# Patient Record
Sex: Female | Born: 1950
Health system: Southern US, Community
[De-identification: ages and names within clinical notes are randomized; demographics above are authoritative.]

## PROBLEM LIST (undated history)

## (undated) DIAGNOSIS — M1990 Unspecified osteoarthritis, unspecified site: Secondary | ICD-10-CM

## (undated) DIAGNOSIS — K08109 Complete loss of teeth, unspecified cause, unspecified class: Secondary | ICD-10-CM

## (undated) DIAGNOSIS — M2550 Pain in unspecified joint: Secondary | ICD-10-CM

## (undated) DIAGNOSIS — R002 Palpitations: Secondary | ICD-10-CM

## (undated) DIAGNOSIS — Z972 Presence of dental prosthetic device (complete) (partial): Secondary | ICD-10-CM

## (undated) DIAGNOSIS — E119 Type 2 diabetes mellitus without complications: Secondary | ICD-10-CM

## (undated) DIAGNOSIS — R7303 Prediabetes: Secondary | ICD-10-CM

## (undated) DIAGNOSIS — I6601 Occlusion and stenosis of right middle cerebral artery: Secondary | ICD-10-CM

## (undated) DIAGNOSIS — Z973 Presence of spectacles and contact lenses: Secondary | ICD-10-CM

## (undated) DIAGNOSIS — F32A Depression, unspecified: Secondary | ICD-10-CM

## (undated) DIAGNOSIS — I499 Cardiac arrhythmia, unspecified: Secondary | ICD-10-CM

## (undated) DIAGNOSIS — I10 Essential (primary) hypertension: Secondary | ICD-10-CM

## (undated) DIAGNOSIS — I4891 Unspecified atrial fibrillation: Secondary | ICD-10-CM

## (undated) DIAGNOSIS — F329 Major depressive disorder, single episode, unspecified: Secondary | ICD-10-CM

## (undated) DIAGNOSIS — E739 Lactose intolerance, unspecified: Secondary | ICD-10-CM

## (undated) DIAGNOSIS — F419 Anxiety disorder, unspecified: Secondary | ICD-10-CM

## (undated) HISTORY — PX: COLONOSCOPY: SHX174

## (undated) HISTORY — PX: DIAGNOSTIC LAPAROSCOPY: SUR761

## (undated) HISTORY — DX: Depression, unspecified: F32.A

## (undated) HISTORY — DX: Pain in unspecified joint: M25.50

## (undated) HISTORY — DX: Unspecified atrial fibrillation: I48.91

## (undated) HISTORY — PX: CEREBRAL ANGIOGRAM: SHX1326

## (undated) HISTORY — DX: Lactose intolerance, unspecified: E73.9

## (undated) HISTORY — DX: Unspecified osteoarthritis, unspecified site: M19.90

## (undated) HISTORY — DX: Palpitations: R00.2

## (undated) HISTORY — DX: Prediabetes: R73.03

## (undated) HISTORY — DX: Anxiety disorder, unspecified: F41.9

---
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
## 2005-01-24 ENCOUNTER — Encounter: Admission: RE | Admit: 2005-01-24 | Discharge: 2005-04-24 | Payer: Self-pay | Admitting: Internal Medicine

## 2007-06-01 ENCOUNTER — Other Ambulatory Visit: Admission: RE | Admit: 2007-06-01 | Discharge: 2007-06-01 | Payer: Self-pay | Admitting: Internal Medicine

## 2009-11-11 DIAGNOSIS — I6601 Occlusion and stenosis of right middle cerebral artery: Secondary | ICD-10-CM

## 2009-11-11 HISTORY — DX: Occlusion and stenosis of right middle cerebral artery: I66.01

## 2010-01-24 ENCOUNTER — Other Ambulatory Visit: Admission: RE | Admit: 2010-01-24 | Discharge: 2010-01-24 | Payer: Self-pay | Admitting: Family Medicine

## 2010-04-24 ENCOUNTER — Encounter: Admission: RE | Admit: 2010-04-24 | Discharge: 2010-04-24 | Payer: Self-pay | Admitting: Family Medicine

## 2010-05-11 ENCOUNTER — Ambulatory Visit (HOSPITAL_COMMUNITY): Admission: RE | Admit: 2010-05-11 | Discharge: 2010-05-11 | Payer: Self-pay | Admitting: Family Medicine

## 2010-05-25 ENCOUNTER — Encounter: Payer: Self-pay | Admitting: Family Medicine

## 2010-05-31 ENCOUNTER — Ambulatory Visit (HOSPITAL_COMMUNITY): Admission: RE | Admit: 2010-05-31 | Discharge: 2010-05-31 | Payer: Self-pay | Admitting: Interventional Radiology

## 2010-06-04 ENCOUNTER — Ambulatory Visit (HOSPITAL_COMMUNITY): Admission: RE | Admit: 2010-06-04 | Discharge: 2010-06-04 | Payer: Self-pay | Admitting: Interventional Radiology

## 2010-06-06 ENCOUNTER — Encounter: Payer: Self-pay | Admitting: Interventional Radiology

## 2010-07-11 ENCOUNTER — Ambulatory Visit (HOSPITAL_COMMUNITY): Admission: RE | Admit: 2010-07-11 | Discharge: 2010-07-11 | Payer: Self-pay | Admitting: Interventional Radiology

## 2010-10-31 ENCOUNTER — Ambulatory Visit (HOSPITAL_COMMUNITY)
Admission: RE | Admit: 2010-10-31 | Discharge: 2010-10-31 | Payer: Self-pay | Source: Home / Self Care | Attending: Interventional Radiology | Admitting: Interventional Radiology

## 2010-12-01 ENCOUNTER — Encounter: Payer: Self-pay | Admitting: *Deleted

## 2010-12-02 ENCOUNTER — Encounter: Payer: Self-pay | Admitting: Interventional Radiology

## 2010-12-02 ENCOUNTER — Encounter: Payer: Self-pay | Admitting: *Deleted

## 2010-12-02 ENCOUNTER — Encounter: Payer: Self-pay | Admitting: Family Medicine

## 2011-01-21 LAB — POCT I-STAT, CHEM 8
BUN: 19 mg/dL (ref 6–23)
Chloride: 104 mEq/L (ref 96–112)
Creatinine, Ser: 0.8 mg/dL (ref 0.4–1.2)
Glucose, Bld: 116 mg/dL — ABNORMAL HIGH (ref 70–99)
HCT: 38 % (ref 36.0–46.0)
Hemoglobin: 12.9 g/dL (ref 12.0–15.0)
Sodium: 142 mEq/L (ref 135–145)

## 2011-01-21 LAB — CBC
HCT: 35.6 % — ABNORMAL LOW (ref 36.0–46.0)
Hemoglobin: 12 g/dL (ref 12.0–15.0)
MCH: 29.6 pg (ref 26.0–34.0)
RDW: 14.1 % (ref 11.5–15.5)

## 2011-01-21 LAB — PROTIME-INR: Prothrombin Time: 13.9 seconds (ref 11.6–15.2)

## 2011-01-25 LAB — DIFFERENTIAL
Eosinophils Relative: 2 % (ref 0–5)
Lymphocytes Relative: 25 % (ref 12–46)
Neutrophils Relative %: 67 % (ref 43–77)

## 2011-01-25 LAB — SURGICAL PCR SCREEN
MRSA, PCR: NEGATIVE
Staphylococcus aureus: NEGATIVE

## 2011-01-25 LAB — PROTIME-INR
INR: 1.03 (ref 0.00–1.49)
Prothrombin Time: 13.7 seconds (ref 11.6–15.2)

## 2011-01-25 LAB — BASIC METABOLIC PANEL
CO2: 32 mEq/L (ref 19–32)
Calcium: 9.4 mg/dL (ref 8.4–10.5)
GFR calc Af Amer: >60 mL/min (ref >60)

## 2011-01-25 LAB — CBC
HCT: 36.8 % (ref 36.0–46.0)
MCHC: 34 g/dL (ref 30.0–36.0)

## 2011-01-26 LAB — PROTIME-INR
INR: 1.08 (ref 0.00–1.49)
Prothrombin Time: 13.9 seconds (ref 11.6–15.2)

## 2011-01-26 LAB — CBC
HCT: 38.7 % (ref 36.0–46.0)
Hemoglobin: 13.1 g/dL (ref 12.0–15.0)
MCH: 30.3 pg (ref 26.0–34.0)
RBC: 4.34 MIL/uL (ref 3.87–5.11)
RDW: 14.7 % (ref 11.5–15.5)
WBC: 8.4 10*3/uL (ref 4.0–10.5)

## 2011-01-26 LAB — BASIC METABOLIC PANEL
BUN: 10 mg/dL (ref 6–23)
CO2: 31 mEq/L (ref 19–32)
Creatinine, Ser: 0.79 mg/dL (ref 0.4–1.2)
GFR calc Af Amer: >60 mL/min (ref >60)
GFR calc non Af Amer: >60 mL/min (ref >60)
Potassium: 3.8 mEq/L (ref 3.5–5.1)

## 2011-01-26 LAB — APTT: aPTT: 31 seconds (ref 24–37)

## 2011-08-02 ENCOUNTER — Other Ambulatory Visit (HOSPITAL_COMMUNITY): Payer: Self-pay | Admitting: Interventional Radiology

## 2011-08-02 DIAGNOSIS — Z09 Encounter for follow-up examination after completed treatment for conditions other than malignant neoplasm: Secondary | ICD-10-CM

## 2011-08-02 DIAGNOSIS — I729 Aneurysm of unspecified site: Secondary | ICD-10-CM

## 2011-08-14 ENCOUNTER — Ambulatory Visit (HOSPITAL_COMMUNITY): Payer: Self-pay

## 2011-11-28 ENCOUNTER — Other Ambulatory Visit (HOSPITAL_COMMUNITY): Payer: Self-pay | Admitting: Interventional Radiology

## 2011-11-28 DIAGNOSIS — I729 Aneurysm of unspecified site: Secondary | ICD-10-CM

## 2012-01-14 ENCOUNTER — Other Ambulatory Visit: Payer: Self-pay | Admitting: Physician Assistant

## 2012-01-14 ENCOUNTER — Other Ambulatory Visit: Payer: Self-pay | Admitting: Radiology

## 2012-01-21 ENCOUNTER — Encounter (HOSPITAL_COMMUNITY): Payer: Self-pay | Admitting: Pharmacy Technician

## 2012-01-22 ENCOUNTER — Other Ambulatory Visit: Payer: Self-pay | Admitting: Radiology

## 2012-01-23 ENCOUNTER — Ambulatory Visit (HOSPITAL_COMMUNITY)
Admission: RE | Admit: 2012-01-23 | Discharge: 2012-01-23 | Disposition: A | Discharge status: Home / Self Care | Payer: Federal, State, Local not specified - PPO | Source: Ambulatory Visit | Attending: Interventional Radiology | Admitting: Interventional Radiology

## 2012-01-23 VITALS — BP 152/65 | HR 89 | Temp 98.1°F | Resp 18 | Ht 65.0 in | Wt 238.0 lb

## 2012-01-23 DIAGNOSIS — Z8673 Personal history of transient ischemic attack (TIA), and cerebral infarction without residual deficits: Secondary | ICD-10-CM | POA: Insufficient documentation

## 2012-01-23 DIAGNOSIS — E669 Obesity, unspecified: Secondary | ICD-10-CM | POA: Insufficient documentation

## 2012-01-23 DIAGNOSIS — I6529 Occlusion and stenosis of unspecified carotid artery: Secondary | ICD-10-CM | POA: Insufficient documentation

## 2012-01-23 DIAGNOSIS — I672 Cerebral atherosclerosis: Secondary | ICD-10-CM | POA: Insufficient documentation

## 2012-01-23 DIAGNOSIS — Z09 Encounter for follow-up examination after completed treatment for conditions other than malignant neoplasm: Secondary | ICD-10-CM | POA: Insufficient documentation

## 2012-01-23 DIAGNOSIS — I729 Aneurysm of unspecified site: Secondary | ICD-10-CM

## 2012-01-23 DIAGNOSIS — I10 Essential (primary) hypertension: Secondary | ICD-10-CM | POA: Insufficient documentation

## 2012-01-23 DIAGNOSIS — E119 Type 2 diabetes mellitus without complications: Secondary | ICD-10-CM | POA: Insufficient documentation

## 2012-01-23 LAB — CBC
HCT: 37 % (ref 36.0–46.0)
Hemoglobin: 12.5 g/dL (ref 12.0–15.0)
MCH: 29.1 pg (ref 26.0–34.0)
MCHC: 33.8 g/dL (ref 30.0–36.0)
MCV: 86 fL (ref 78.0–100.0)
Platelets: 239 10*3/uL (ref 150–400)
RBC: 4.3 MIL/uL (ref 3.87–5.11)
RDW: 14.9 % (ref 11.5–15.5)
WBC: 7.4 10*3/uL (ref 4.0–10.5)

## 2012-01-23 LAB — BASIC METABOLIC PANEL
BUN: 12 mg/dL (ref 6–23)
CO2: 29 mEq/L (ref 19–32)
Calcium: 9.5 mg/dL (ref 8.4–10.5)
Chloride: 105 mEq/L (ref 96–112)
Creatinine, Ser: 0.68 mg/dL (ref 0.50–1.10)
GFR calc Af Amer: >90 mL/min (ref >90)
GFR calc non Af Amer: >90 mL/min (ref >90)
Glucose, Bld: 122 mg/dL — ABNORMAL HIGH (ref 70–99)
Potassium: 3.4 mEq/L — ABNORMAL LOW (ref 3.5–5.1)
Sodium: 140 mEq/L (ref 135–145)

## 2012-01-23 LAB — APTT: aPTT: 33 seconds (ref 24–37)

## 2012-01-23 LAB — PROTIME-INR
INR: 1.02 (ref 0.00–1.49)
Prothrombin Time: 13.6 seconds (ref 11.6–15.2)

## 2012-01-23 MED ORDER — FENTANYL CITRATE 0.05 MG/ML IJ SOLN
INTRAMUSCULAR | Status: AC | PRN
  Administered 2012-01-23 (×2): 12.5 ug via INTRAVENOUS
  Administered 2012-01-23: 25 ug via INTRAVENOUS

## 2012-01-23 MED ORDER — SODIUM CHLORIDE 0.9 % IV SOLN
INTRAVENOUS | Status: DC
  Administered 2012-01-23: 1000 mL via INTRAVENOUS

## 2012-01-23 MED ORDER — HYDRALAZINE HCL 20 MG/ML IJ SOLN
INTRAMUSCULAR | Status: AC
  Administered 2012-01-23 (×4): 5 mg via INTRAVENOUS
  Filled 2012-01-23: qty 1

## 2012-01-23 MED ORDER — SODIUM CHLORIDE 0.9 % IV SOLN: INTRAVENOUS | Status: AC

## 2012-01-23 MED ORDER — FENTANYL CITRATE 0.05 MG/ML IJ SOLN
INTRAMUSCULAR | Status: AC
  Filled 2012-01-23: qty 4

## 2012-01-23 MED ORDER — MIDAZOLAM HCL 2 MG/2ML IJ SOLN
INTRAMUSCULAR | Status: AC
  Filled 2012-01-23: qty 6

## 2012-01-23 MED ORDER — MIDAZOLAM HCL 5 MG/5ML IJ SOLN
INTRAMUSCULAR | Status: AC | PRN
Start: 2012-01-23 — End: 2012-01-23
  Administered 2012-01-23: 1 mg via INTRAVENOUS
  Administered 2012-01-23: 0.5 mg via INTRAVENOUS

## 2012-01-23 MED ORDER — HEPARIN SODIUM (PORCINE) 1000 UNIT/ML IJ SOLN
INTRAMUSCULAR | Status: AC | PRN
  Administered 2012-01-23: 500 [IU] via INTRAVENOUS

## 2012-01-23 NOTE — ED Notes (Signed)
Report called to short stay pt stable transported via stretcher

## 2012-01-23 NOTE — ED Notes (Signed)
Rt groin site level "0" pressure dressing intact. Pedal pulse +3

## 2012-01-23 NOTE — Discharge Instructions (Signed)

## 2012-01-23 NOTE — H&P (Signed)
Cynthia Frost is an 61 y.o. female.   Chief Complaint: cerebrovascular disease HPI: Patient with prior history of right cerebral hemispheric stroke 2011 secondary to right middle cerebral artery stenosis presents today for follow up cerebral arteriogram to reassess extent of disease.  PMH: HTN,DM(01/2009), obesity,CVA 2011 ZOX:WRUE Social History:  does not have a smoking history on file. She does not have any smokeless tobacco history on file. Her alcohol and drug histories not on file.Lives in [place], 4 children, married Family History; positive for cerebral aneurysms, CVA  Allergies: No Known Allergies  Medications Prior to Admission  Medication Sig Dispense Refill  . amLODipine (NORVASC) 5 MG tablet Take 5 mg by mouth daily.      . Ascorbic Acid (VITAMIN C) 1000 MG tablet Take 1,000 mg by mouth daily.      . carvedilol (COREG) 25 MG tablet Take 25 mg by mouth 2 (two) times daily with a meal.      . cholecalciferol (VITAMIN D) 1000 UNITS tablet Take 5,000 Units by mouth daily.      . clopidogrel (PLAVIX) 75 MG tablet Take 75 mg by mouth daily.      Marland Kitchen doxazosin (CARDURA) 8 MG tablet Take 8 mg by mouth at bedtime.      Marland Kitchen losartan-hydrochlorothiazide (HYZAAR) 100-25 MG per tablet Take 1 tablet by mouth daily.      . Multiple Vitamin (MULITIVITAMIN WITH MINERALS) TABS Take 1 tablet by mouth daily.      Marland Kitchen omega-3 acid ethyl esters (LOVAZA) 1 G capsule Take 1 g by mouth daily.       Medications Prior to Admission  Medication Dose Route Frequency Provider Last Rate Last Dose  . 0.9 %  sodium chloride infusion   Intravenous Continuous Oneal Grout, MD 50 mL/hr at 01/23/12 0706 1,000 mL at 01/23/12 0706    Results for orders placed during the hospital encounter of 01/23/12 (from the past 48 hour(s))  APTT     Status: Normal   Collection Time   01/23/12  6:48 AM      Component Value Range Comment   aPTT 33  24 - 37 (seconds)   BASIC METABOLIC PANEL     Status: Abnormal   Collection Time   01/23/12  6:48 AM      Component Value Range Comment   Sodium 140  135 - 145 (mEq/L)    Potassium 3.4 (*) 3.5 - 5.1 (mEq/L)    Chloride 105  96 - 112 (mEq/L)    CO2 29  19 - 32 (mEq/L)    Glucose, Bld 122 (*) 70 - 99 (mg/dL)    BUN 12  6 - 23 (mg/dL)    Creatinine, Ser 4.54  0.50 - 1.10 (mg/dL)    Calcium 9.5  8.4 - 10.5 (mg/dL)    GFR calc non Af Amer >90  >90 (mL/min)    GFR calc Af Amer >90  >90 (mL/min)   CBC     Status: Normal   Collection Time   01/23/12  6:48 AM      Component Value Range Comment   WBC 7.4  4.0 - 10.5 (K/uL)    RBC 4.30  3.87 - 5.11 (MIL/uL)    Hemoglobin 12.5  12.0 - 15.0 (g/dL)    HCT 09.8  11.9 - 14.7 (%)    MCV 86.0  78.0 - 100.0 (fL)    MCH 29.1  26.0 - 34.0 (pg)    MCHC 33.8  30.0 - 36.0 (g/dL)  RDW 14.9  11.5 - 15.5 (%)    Platelets 239  150 - 400 (K/uL)   PROTIME-INR     Status: Normal   Collection Time   01/23/12  6:48 AM      Component Value Range Comment   Prothrombin Time 13.6  11.6 - 15.2 (seconds)    INR 1.02  0.00 - 1.49     No results found.  Review of Systems  Constitutional: Negative for fever and chills.  HENT: Positive for congestion.        Seasonal allergies, postnasal drip  Respiratory: Positive for cough. Negative for shortness of breath.   Cardiovascular: Negative for chest pain.  Gastrointestinal: Negative for nausea, vomiting and abdominal pain.  Musculoskeletal: Positive for joint pain.  Neurological: Negative for dizziness, sensory change, speech change, focal weakness and headaches.  Endo/Heme/Allergies: Positive for environmental allergies. Does not bruise/bleed easily.    Blood pressure 196/78, pulse 55, temperature 96.7 F (35.9 C), temperature source Oral, resp. rate 18, height 5\' 5"  (1.651 m), weight 238 lb (107.956 kg), SpO2 97.00%. Physical Exam  Constitutional: She is oriented to person, place, and time. She appears well-developed and well-nourished.  Cardiovascular: Regular rhythm.         bradycardic  Respiratory: Effort normal and breath sounds normal.  GI: Soft. Bowel sounds are normal. There is no tenderness.       obese  Musculoskeletal: Normal range of motion. She exhibits no edema.  Neurological: She is alert and oriented to person, place, and time. No cranial nerve deficit. Coordination normal.  Psychiatric: She has a normal mood and affect.     Assessment/Plan Patient with prior history of right cerebral hemispheric CVA 2011 secondary to right MCA stenosis; also with mild FMD right ICA; plan is for cerebral arteriogram to reassess extent of cerebrovascular disease.  Cynthia Frost,D KEVIN 01/23/2012, 8:01 AM

## 2012-01-23 NOTE — Procedures (Signed)
S/P bilat CCA arteriograms  RET CFA approach Preliminary findings  1.75 to 80 % stenosis of RT MCA M1 segment

## 2012-01-23 NOTE — ED Notes (Signed)
Rt groin level "0" pedal pulse +3

## 2012-01-24 ENCOUNTER — Telehealth (HOSPITAL_COMMUNITY): Payer: Self-pay | Admitting: Radiology

## 2012-08-05 ENCOUNTER — Other Ambulatory Visit: Payer: Self-pay | Admitting: Family Medicine

## 2012-08-05 ENCOUNTER — Ambulatory Visit
Admission: RE | Admit: 2012-08-05 | Discharge: 2012-08-05 | Disposition: A | Discharge status: Home / Self Care | Payer: Federal, State, Local not specified - PPO | Source: Ambulatory Visit | Attending: Family Medicine | Admitting: Family Medicine

## 2012-08-05 DIAGNOSIS — E049 Nontoxic goiter, unspecified: Secondary | ICD-10-CM

## 2012-08-06 ENCOUNTER — Other Ambulatory Visit: Payer: Self-pay | Admitting: Family Medicine

## 2012-08-06 DIAGNOSIS — E042 Nontoxic multinodular goiter: Secondary | ICD-10-CM

## 2012-08-07 ENCOUNTER — Telehealth (HOSPITAL_COMMUNITY): Payer: Self-pay

## 2012-08-07 NOTE — Telephone Encounter (Signed)
I spoke with Tammy in regards to the pt having a thyroid bx.  She needed to know if Dr. Corliss Skains would allow the pt to stop her Plavix for 5 days.  Per Dr. Corliss Skains, the pt needs to be told the risks of not taking her Plavix.  He stated that she could remain on her ASA but come off of her Plavix.

## 2012-08-12 ENCOUNTER — Ambulatory Visit
Admission: RE | Admit: 2012-08-12 | Discharge: 2012-08-12 | Disposition: A | Discharge status: Home / Self Care | Payer: Federal, State, Local not specified - PPO | Source: Ambulatory Visit | Attending: Family Medicine | Admitting: Family Medicine

## 2012-08-12 ENCOUNTER — Other Ambulatory Visit (HOSPITAL_COMMUNITY)
Admission: RE | Admit: 2012-08-12 | Discharge: 2012-08-12 | Disposition: A | Discharge status: Home / Self Care | Payer: Federal, State, Local not specified - PPO | Source: Ambulatory Visit | Attending: Interventional Radiology | Admitting: Interventional Radiology

## 2012-08-12 DIAGNOSIS — E049 Nontoxic goiter, unspecified: Secondary | ICD-10-CM | POA: Insufficient documentation

## 2012-08-12 DIAGNOSIS — E042 Nontoxic multinodular goiter: Secondary | ICD-10-CM

## 2012-09-01 ENCOUNTER — Ambulatory Visit (INDEPENDENT_AMBULATORY_CARE_PROVIDER_SITE_OTHER): Payer: Self-pay | Admitting: Family Medicine

## 2012-09-01 DIAGNOSIS — Z713 Dietary counseling and surveillance: Secondary | ICD-10-CM

## 2012-11-12 ENCOUNTER — Other Ambulatory Visit (HOSPITAL_COMMUNITY): Payer: Self-pay | Admitting: Interventional Radiology

## 2012-11-12 DIAGNOSIS — N320 Bladder-neck obstruction: Secondary | ICD-10-CM

## 2013-01-14 ENCOUNTER — Other Ambulatory Visit: Payer: Self-pay | Admitting: Radiology

## 2013-01-15 ENCOUNTER — Telehealth (HOSPITAL_COMMUNITY): Payer: Self-pay | Admitting: Interventional Radiology

## 2013-01-18 ENCOUNTER — Telehealth (HOSPITAL_COMMUNITY): Payer: Self-pay | Admitting: Interventional Radiology

## 2013-01-19 ENCOUNTER — Ambulatory Visit (HOSPITAL_COMMUNITY)
Admission: RE | Admit: 2013-01-19 | Discharge: 2013-01-19 | Disposition: A | Discharge status: Home / Self Care | Payer: Federal, State, Local not specified - PPO | Source: Ambulatory Visit | Attending: Interventional Radiology | Admitting: Interventional Radiology

## 2013-01-22 ENCOUNTER — Other Ambulatory Visit: Payer: Self-pay | Admitting: Radiology

## 2013-01-29 ENCOUNTER — Ambulatory Visit (HOSPITAL_COMMUNITY)
Admission: RE | Admit: 2013-01-29 | Discharge: 2013-01-29 | Disposition: A | Discharge status: Home / Self Care | Payer: Federal, State, Local not specified - PPO | Source: Ambulatory Visit | Attending: Interventional Radiology | Admitting: Interventional Radiology

## 2013-01-29 ENCOUNTER — Encounter (HOSPITAL_COMMUNITY): Payer: Self-pay

## 2013-01-29 DIAGNOSIS — N320 Bladder-neck obstruction: Secondary | ICD-10-CM

## 2013-01-29 DIAGNOSIS — I672 Cerebral atherosclerosis: Secondary | ICD-10-CM | POA: Insufficient documentation

## 2013-01-29 LAB — CBC WITH DIFFERENTIAL/PLATELET
Eosinophils Absolute: 0.2 10*3/uL (ref 0.0–0.7)
Eosinophils Relative: 2 % (ref 0–5)
Hemoglobin: 13.9 g/dL (ref 12.0–15.0)
Lymphs Abs: 2.3 10*3/uL (ref 0.7–4.0)
MCH: 30 pg (ref 26.0–34.0)
MCV: 85.7 fL (ref 78.0–100.0)
Monocytes Relative: 5 % (ref 3–12)
Platelets: 256 10*3/uL (ref 150–400)
RBC: 4.63 MIL/uL (ref 3.87–5.11)

## 2013-01-29 LAB — BASIC METABOLIC PANEL
BUN: 12 mg/dL (ref 6–23)
CO2: 27 mEq/L (ref 19–32)
Calcium: 9.4 mg/dL (ref 8.4–10.5)
Glucose, Bld: 117 mg/dL — ABNORMAL HIGH (ref 70–99)
Sodium: 140 mEq/L (ref 135–145)

## 2013-01-29 LAB — PROTIME-INR: INR: 0.99 (ref 0.00–1.49)

## 2013-01-29 MED ORDER — MIDAZOLAM HCL 2 MG/2ML IJ SOLN
INTRAMUSCULAR | Status: AC | PRN
  Administered 2013-01-29: 1 mg via INTRAVENOUS
  Administered 2013-01-29: 0.5 mg via INTRAVENOUS

## 2013-01-29 MED ORDER — HYDRALAZINE HCL 20 MG/ML IJ SOLN
INTRAMUSCULAR | Status: AC | PRN
  Administered 2013-01-29 (×3): 5 mg via INTRAVENOUS

## 2013-01-29 MED ORDER — HYDRALAZINE HCL 20 MG/ML IJ SOLN
INTRAMUSCULAR | Status: AC
  Filled 2013-01-29: qty 1

## 2013-01-29 MED ORDER — IOHEXOL 300 MG/ML  SOLN
150.0000 mL | Freq: Once | INTRAMUSCULAR | Status: AC | PRN
  Administered 2013-01-29: 50 mL via INTRA_ARTERIAL

## 2013-01-29 MED ORDER — FENTANYL CITRATE 0.05 MG/ML IJ SOLN
INTRAMUSCULAR | Status: AC
  Filled 2013-01-29: qty 4

## 2013-01-29 MED ORDER — SODIUM CHLORIDE 0.9 % IV SOLN
Freq: Once | INTRAVENOUS | Status: AC
  Administered 2013-01-29: 07:00:00 via INTRAVENOUS

## 2013-01-29 MED ORDER — SODIUM CHLORIDE 0.9 % IV SOLN: INTRAVENOUS | Status: AC

## 2013-01-29 MED ORDER — FENTANYL CITRATE 0.05 MG/ML IJ SOLN
INTRAMUSCULAR | Status: AC | PRN
  Administered 2013-01-29: 12.5 ug via INTRAVENOUS
  Administered 2013-01-29: 25 ug via INTRAVENOUS

## 2013-01-29 MED ORDER — MIDAZOLAM HCL 2 MG/2ML IJ SOLN
INTRAMUSCULAR | Status: AC
  Filled 2013-01-29: qty 4

## 2013-01-29 NOTE — Procedures (Signed)
S/P bilateral common carotid arteriogram . RT CFa approach  Findings . 1.Stable 85 % stenosis RT MCA prox . 2. Patent Lt ophthalmic artery

## 2013-01-29 NOTE — H&P (Signed)
Chief Complaint: "I'm here for an arteriogram" HPI: Cynthia Frost is an 62 y.o. female with prior hx of Rt MCA M1 stenosis. She is here for yearly follow up arteriogram. She reports that her eye doctor was worried she may have a blood clot behind her left eye as she has been having some visual changes and headaches behind her eye. She is still taking her Plavix. PMHx and meds reviewed. She feels well with the exception of some mild post nasal drip from allergies, but no fevers, chills, cough, SOB, CP. No dysuria or diarrhea.  Past Medical History: HTN, Hyperlipidemia  Past Surgical History: History reviewed. No pertinent past surgical history.  Family History: No family history on file.  Social History:  has no tobacco, alcohol, and drug history on file.  Allergies: No Known Allergies  Medications: Norvasc, Coreg, Plavix, Cardura, Hyzaar, MVI  Please HPI for pertinent positives, otherwise complete 10 system ROS negative.  Physical Exam: Blood pressure 164/80, pulse 66, temperature 97.3 F (36.3 C), temperature source Oral, resp. rate 18, height 5\' 5"  (1.651 m), weight 239 lb (108.41 kg), SpO2 98.00%. Body mass index is 39.77 kg/(m^2).   General Appearance:  Alert, cooperative, no distress, appears stated age  Head:  Normocephalic, without obvious abnormality, atraumatic  EENT: Unremarkable, PERRLA, EOMI  Neck: Supple, symmetrical, trachea midline, no adenopathy, thyroid: not enlarged, symmetric, no tenderness/mass/nodules  Lungs:   Clear to auscultation bilaterally, no w/r/r, respirations unlabored without use of accessory muscles.  Heart:  Regular rate and rhythm, S1, S2 normal, no murmur, rub or gallop. Carotids 2+ without bruit.  Extremities: Extremities normal, atraumatic, no cyanosis or edema  Pulses: 2+ and symmetric  Neurologic: Normal affect, no gross deficits.   Results for orders placed during the hospital encounter of 01/29/13 (from the past 48 hour(s))  APTT      Status: None   Collection Time    01/29/13  7:12 AM      Result Value Range   aPTT 33  24 - 37 seconds  BASIC METABOLIC PANEL     Status: Abnormal   Collection Time    01/29/13  7:12 AM      Result Value Range   Sodium 140  135 - 145 mEq/L   Potassium 3.8  3.5 - 5.1 mEq/L   Chloride 102  96 - 112 mEq/L   CO2 27  19 - 32 mEq/L   Glucose, Bld 117 (*) 70 - 99 mg/dL   BUN 12  6 - 23 mg/dL   Creatinine, Ser 4.09  0.50 - 1.10 mg/dL   Calcium 9.4  8.4 - 81.1 mg/dL   GFR calc non Af Amer >90  >90 mL/min   GFR calc Af Amer >90  >90 mL/min   Comment:            The eGFR has been calculated     using the CKD EPI equation.     This calculation has not been     validated in all clinical     situations.     eGFR's persistently     <90 mL/min signify     possible Chronic Kidney Disease.  CBC WITH DIFFERENTIAL     Status: None   Collection Time    01/29/13  7:12 AM      Result Value Range   WBC 9.1  4.0 - 10.5 K/uL   RBC 4.63  3.87 - 5.11 MIL/uL   Hemoglobin 13.9  12.0 - 15.0 g/dL  HCT 39.7  36.0 - 46.0 %   MCV 85.7  78.0 - 100.0 fL   MCH 30.0  26.0 - 34.0 pg   MCHC 35.0  30.0 - 36.0 g/dL   RDW 78.2  95.6 - 21.3 %   Platelets 256  150 - 400 K/uL   Neutrophils Relative 68  43 - 77 %   Neutro Abs 6.1  1.7 - 7.7 K/uL   Lymphocytes Relative 25  12 - 46 %   Lymphs Abs 2.3  0.7 - 4.0 K/uL   Monocytes Relative 5  3 - 12 %   Monocytes Absolute 0.5  0.1 - 1.0 K/uL   Eosinophils Relative 2  0 - 5 %   Eosinophils Absolute 0.2  0.0 - 0.7 K/uL   Basophils Relative 1  0 - 1 %   Basophils Absolute 0.1  0.0 - 0.1 K/uL  PROTIME-INR     Status: None   Collection Time    01/29/13  7:12 AM      Result Value Range   Prothrombin Time 13.0  11.6 - 15.2 seconds   INR 0.99  0.00 - 1.49   No results found.  Assessment/Plan Hx of right MCA M1 segment stenosis New (L)eye symptoms For cerebral arteriogram. Reviewed procedure, risks, complications. Labs reviewed. Consent signed in  chart  Brayton El PA-C 01/29/2013, 8:26 AM

## 2013-01-29 NOTE — ED Notes (Signed)
Short stay called for bed request to continue recovery until discharge at 1245.  Spoke with Renee', no beds available at this time.  She will call when bed available

## 2013-01-29 NOTE — ED Notes (Signed)
Right groin closure Exoseal 5 FR covered with gauze and tegaderm

## 2013-05-12 ENCOUNTER — Other Ambulatory Visit (HOSPITAL_COMMUNITY)
Admission: RE | Admit: 2013-05-12 | Discharge: 2013-05-12 | Disposition: A | Discharge status: Home / Self Care | Payer: Federal, State, Local not specified - PPO | Source: Ambulatory Visit | Attending: Family Medicine | Admitting: Family Medicine

## 2013-05-12 ENCOUNTER — Other Ambulatory Visit: Payer: Self-pay | Admitting: Family Medicine

## 2013-05-12 DIAGNOSIS — Z01419 Encounter for gynecological examination (general) (routine) without abnormal findings: Secondary | ICD-10-CM | POA: Insufficient documentation

## 2014-02-25 ENCOUNTER — Other Ambulatory Visit (HOSPITAL_COMMUNITY): Payer: Self-pay | Admitting: Interventional Radiology

## 2014-02-25 DIAGNOSIS — I771 Stricture of artery: Secondary | ICD-10-CM

## 2014-03-17 DIAGNOSIS — R0982 Postnasal drip: Secondary | ICD-10-CM | POA: Diagnosis not present

## 2014-03-17 DIAGNOSIS — R059 Cough, unspecified: Secondary | ICD-10-CM | POA: Diagnosis not present

## 2014-03-17 DIAGNOSIS — R05 Cough: Secondary | ICD-10-CM | POA: Diagnosis not present

## 2014-03-24 DIAGNOSIS — L723 Sebaceous cyst: Secondary | ICD-10-CM | POA: Diagnosis not present

## 2014-03-28 DIAGNOSIS — I10 Essential (primary) hypertension: Secondary | ICD-10-CM | POA: Diagnosis not present

## 2014-03-28 DIAGNOSIS — I679 Cerebrovascular disease, unspecified: Secondary | ICD-10-CM | POA: Diagnosis not present

## 2014-03-28 DIAGNOSIS — E041 Nontoxic single thyroid nodule: Secondary | ICD-10-CM | POA: Diagnosis not present

## 2014-03-28 DIAGNOSIS — R809 Proteinuria, unspecified: Secondary | ICD-10-CM | POA: Diagnosis not present

## 2014-03-28 DIAGNOSIS — E1129 Type 2 diabetes mellitus with other diabetic kidney complication: Secondary | ICD-10-CM | POA: Diagnosis not present

## 2014-03-31 DIAGNOSIS — D492 Neoplasm of unspecified behavior of bone, soft tissue, and skin: Secondary | ICD-10-CM | POA: Diagnosis not present

## 2014-03-31 DIAGNOSIS — M19049 Primary osteoarthritis, unspecified hand: Secondary | ICD-10-CM | POA: Diagnosis not present

## 2014-03-31 DIAGNOSIS — G5600 Carpal tunnel syndrome, unspecified upper limb: Secondary | ICD-10-CM | POA: Diagnosis not present

## 2014-04-08 ENCOUNTER — Other Ambulatory Visit: Payer: Self-pay | Admitting: Radiology

## 2014-04-12 ENCOUNTER — Encounter (HOSPITAL_COMMUNITY): Payer: Self-pay

## 2014-04-14 ENCOUNTER — Ambulatory Visit (HOSPITAL_COMMUNITY)
Admission: RE | Admit: 2014-04-14 | Discharge: 2014-04-14 | Disposition: A | Discharge status: Home / Self Care | Payer: BC Managed Care – PPO | Source: Ambulatory Visit | Attending: Interventional Radiology | Admitting: Interventional Radiology

## 2014-04-14 ENCOUNTER — Encounter (HOSPITAL_COMMUNITY): Payer: Self-pay

## 2014-04-14 DIAGNOSIS — E119 Type 2 diabetes mellitus without complications: Secondary | ICD-10-CM | POA: Insufficient documentation

## 2014-04-14 DIAGNOSIS — I669 Occlusion and stenosis of unspecified cerebral artery: Secondary | ICD-10-CM | POA: Diagnosis not present

## 2014-04-14 DIAGNOSIS — I10 Essential (primary) hypertension: Secondary | ICD-10-CM | POA: Insufficient documentation

## 2014-04-14 DIAGNOSIS — I6529 Occlusion and stenosis of unspecified carotid artery: Secondary | ICD-10-CM | POA: Diagnosis not present

## 2014-04-14 DIAGNOSIS — I672 Cerebral atherosclerosis: Secondary | ICD-10-CM | POA: Insufficient documentation

## 2014-04-14 DIAGNOSIS — I720 Aneurysm of carotid artery: Secondary | ICD-10-CM | POA: Diagnosis not present

## 2014-04-14 DIAGNOSIS — I6601 Occlusion and stenosis of right middle cerebral artery: Secondary | ICD-10-CM | POA: Insufficient documentation

## 2014-04-14 DIAGNOSIS — I771 Stricture of artery: Secondary | ICD-10-CM

## 2014-04-14 HISTORY — DX: Occlusion and stenosis of right middle cerebral artery: I66.01

## 2014-04-14 HISTORY — DX: Type 2 diabetes mellitus without complications: E11.9

## 2014-04-14 HISTORY — DX: Essential (primary) hypertension: I10

## 2014-04-14 LAB — CBC WITH DIFFERENTIAL/PLATELET
Basophils Absolute: 0 10*3/uL (ref 0.0–0.1)
Basophils Relative: 0 % (ref 0–1)
Eosinophils Absolute: 0.3 10*3/uL (ref 0.0–0.7)
Eosinophils Relative: 3 % (ref 0–5)
HCT: 36.9 % (ref 36.0–46.0)
HEMOGLOBIN: 12.7 g/dL (ref 12.0–15.0)
LYMPHS ABS: 2 10*3/uL (ref 0.7–4.0)
LYMPHS PCT: 25 % (ref 12–46)
MCH: 30 pg (ref 26.0–34.0)
MCHC: 34.4 g/dL (ref 30.0–36.0)
MCV: 87.2 fL (ref 78.0–100.0)
MONOS PCT: 5 % (ref 3–12)
Monocytes Absolute: 0.4 10*3/uL (ref 0.1–1.0)
NEUTROS ABS: 5.3 10*3/uL (ref 1.7–7.7)
NEUTROS PCT: 67 % (ref 43–77)
PLATELETS: 232 10*3/uL (ref 150–400)
RBC: 4.23 MIL/uL (ref 3.87–5.11)
RDW: 15.3 % (ref 11.5–15.5)
WBC: 8 10*3/uL (ref 4.0–10.5)

## 2014-04-14 LAB — BASIC METABOLIC PANEL
BUN: 11 mg/dL (ref 6–23)
CALCIUM: 9.2 mg/dL (ref 8.4–10.5)
CO2: 26 mEq/L (ref 19–32)
Chloride: 104 mEq/L (ref 96–112)
Creatinine, Ser: 0.61 mg/dL (ref 0.50–1.10)
GFR calc Af Amer: >90 mL/min (ref >90)
GLUCOSE: 124 mg/dL — AB (ref 70–99)
POTASSIUM: 3.8 meq/L (ref 3.7–5.3)
SODIUM: 143 meq/L (ref 137–147)

## 2014-04-14 LAB — APTT: APTT: 32 s (ref 24–37)

## 2014-04-14 LAB — PROTIME-INR
INR: 1.03 (ref 0.00–1.49)
Prothrombin Time: 13.3 seconds (ref 11.6–15.2)

## 2014-04-14 MED ORDER — SODIUM CHLORIDE 0.9 % IV SOLN: INTRAVENOUS | Status: AC

## 2014-04-14 MED ORDER — SODIUM CHLORIDE 0.9 % IV SOLN
INTRAVENOUS | Status: DC
  Administered 2014-04-14: 07:00:00 via INTRAVENOUS

## 2014-04-14 MED ORDER — MIDAZOLAM HCL 2 MG/2ML IJ SOLN
INTRAMUSCULAR | Status: AC | PRN
  Administered 2014-04-14: 1 mg via INTRAVENOUS
  Administered 2014-04-14: 0.5 mg via INTRAVENOUS

## 2014-04-14 MED ORDER — IOHEXOL 300 MG/ML  SOLN
150.0000 mL | Freq: Once | INTRAMUSCULAR | Status: AC | PRN
  Administered 2014-04-14: 50 mL via INTRA_ARTERIAL

## 2014-04-14 MED ORDER — HYDRALAZINE HCL 20 MG/ML IJ SOLN
INTRAMUSCULAR | Status: AC
  Filled 2014-04-14: qty 1

## 2014-04-14 MED ORDER — HYDRALAZINE HCL 20 MG/ML IJ SOLN
INTRAMUSCULAR | Status: AC | PRN
  Administered 2014-04-14: 5 mg via INTRAVENOUS

## 2014-04-14 MED ORDER — FENTANYL CITRATE 0.05 MG/ML IJ SOLN
INTRAMUSCULAR | Status: AC | PRN
  Administered 2014-04-14: 12.5 ug via INTRAVENOUS
  Administered 2014-04-14: 25 ug via INTRAVENOUS

## 2014-04-14 MED ORDER — FENTANYL CITRATE 0.05 MG/ML IJ SOLN
INTRAMUSCULAR | Status: AC
  Filled 2014-04-14: qty 2

## 2014-04-14 MED ORDER — HEPARIN SOD (PORK) LOCK FLUSH 100 UNIT/ML IV SOLN
INTRAVENOUS | Status: AC | PRN
  Administered 2014-04-14: 1000 [IU] via INTRAVENOUS

## 2014-04-14 MED ORDER — MIDAZOLAM HCL 2 MG/2ML IJ SOLN
INTRAMUSCULAR | Status: AC
  Filled 2014-04-14: qty 4

## 2014-04-14 NOTE — Sedation Documentation (Signed)
Patient denies pain and is resting comfortably.  

## 2014-04-14 NOTE — H&P (Signed)
Cynthia Frost is an 63 y.o. female.   Chief Complaint: Pt scheduled today for follow up cerebral arteriogram regarding known R Middle Cerebral Artery stenosis First seen by [facility]iology 05/2010 after TIA like episode with Left sided weakness and tingling; + visual change that resolved within minutes Cerebral arteriogram 05/2010 revealed R MCA stenosis.  Pt was asymptomatic and placed on ASA/Plavix.  Follow up 3 mo later showed actual improvement in stenosis. 10/2010 arteriogram was stable; 3/13 and 3/14 arteriograms all stable Pt still asymptomatic on ASA/Plavix. Now scheduled for cerebral arteriogram with Dr Estanislado Pandy  HPI: HTN; R MCA stenosis; DM   Past Medical History  Diagnosis Date  . Hypertension   . Stenosis of right middle cerebral artery 2011  . Diabetes mellitus without complication     History reviewed. No pertinent past surgical history.  History reviewed. No pertinent family history. Social History:  reports that she has never smoked. She does not have any smokeless tobacco history on file. Her alcohol and drug histories are not on file.  Allergies: No Known Allergies   (Not in a hospital admission)  Results for orders placed during the hospital encounter of 04/14/14 (from the past 48 hour(s))  APTT     Status: None   Collection Time    04/14/14  6:47 AM      Result Value Ref Range   aPTT 32  24 - 37 seconds  BASIC METABOLIC PANEL     Status: Abnormal   Collection Time    04/14/14  6:47 AM      Result Value Ref Range   Sodium 143  137 - 147 mEq/L   Potassium 3.8  3.7 - 5.3 mEq/L   Chloride 104  96 - 112 mEq/L   CO2 26  19 - 32 mEq/L   Glucose, Bld 124 (*) 70 - 99 mg/dL   BUN 11  6 - 23 mg/dL   Creatinine, Ser 0.61  0.50 - 1.10 mg/dL   Calcium 9.2  8.4 - 10.5 mg/dL   GFR calc non Af Amer >90  >90 mL/min   GFR calc Af Amer >90  >90 mL/min   Comment: (NOTE)     The eGFR has been calculated using the CKD EPI equation.     This calculation  has not been validated in all clinical situations.     eGFR's persistently <90 mL/min signify possible Chronic Kidney     Disease.  CBC WITH DIFFERENTIAL     Status: None   Collection Time    04/14/14  6:47 AM      Result Value Ref Range   WBC 8.0  4.0 - 10.5 K/uL   RBC 4.23  3.87 - 5.11 MIL/uL   Hemoglobin 12.7  12.0 - 15.0 g/dL   HCT 36.9  36.0 - 46.0 %   MCV 87.2  78.0 - 100.0 fL   MCH 30.0  26.0 - 34.0 pg   MCHC 34.4  30.0 - 36.0 g/dL   RDW 15.3  11.5 - 15.5 %   Platelets 232  150 - 400 K/uL   Neutrophils Relative % 67  43 - 77 %   Neutro Abs 5.3  1.7 - 7.7 K/uL   Lymphocytes Relative 25  12 - 46 %   Lymphs Abs 2.0  0.7 - 4.0 K/uL   Monocytes Relative 5  3 - 12 %   Monocytes Absolute 0.4  0.1 - 1.0 K/uL   Eosinophils Relative 3  0 - 5 %  Eosinophils Absolute 0.3  0.0 - 0.7 K/uL   Basophils Relative 0  0 - 1 %   Basophils Absolute 0.0  0.0 - 0.1 K/uL  PROTIME-INR     Status: None   Collection Time    04/14/14  6:47 AM      Result Value Ref Range   Prothrombin Time 13.3  11.6 - 15.2 seconds   INR 1.03  0.00 - 1.49   No results found.  Review of Systems  Constitutional: Negative for fever and weight loss.  Eyes: Negative for blurred vision and double vision.  Respiratory: Positive for cough. Negative for shortness of breath.   Cardiovascular: Negative for chest pain.  Gastrointestinal: Negative for nausea, vomiting and abdominal pain.  Neurological: Negative for dizziness, tingling, tremors, sensory change, speech change, weakness and headaches.  Psychiatric/Behavioral: Negative for substance abuse.    Blood pressure 166/66, pulse 57, temperature 98.4 F (36.9 C), temperature source Oral, resp. rate 15, height 5' 5"  (1.651 m), weight 117.935 kg (260 lb), SpO2 98.00%. Physical Exam  Constitutional: She is oriented to person, place, and time. She appears well-developed and well-nourished.  HENT:  dentures  Cardiovascular: Normal rate and regular rhythm.   No murmur  heard. Respiratory: Effort normal and breath sounds normal. She has no wheezes.  GI: Soft. Bowel sounds are normal. There is no tenderness.  Musculoskeletal: Normal range of motion.  Neurological: She is alert and oriented to person, place, and time.  Skin: Skin is warm and dry.  Psychiatric: She has a normal mood and affect. Her behavior is normal. Judgment and thought content normal.     Assessment/Plan Known R MCA stenosis Followed by Dr Estanislado Pandy Pt asymptomatic on ASA/Plavix Scheduled now for recheck Pt aware of procedure benefits and risks and agreeable to proceed Consent signed and in chart   Lavonia Drafts 04/14/2014, 8:55 AM

## 2014-04-14 NOTE — Procedures (Signed)
S/P 4 vessel cerebral arteriogram. RT CFa approach. Findings . Approx 85 to 90% stenosis of RT MCA M1 seg

## 2014-04-14 NOTE — Discharge Instructions (Signed)
Angiography, Care After ° °Refer to this sheet in the next few weeks. These instructions provide you with information on caring for yourself after your procedure. Your health care provider may also give you more specific instructions. Your treatment has been planned according to current medical practices, but problems sometimes occur. Call your health care provider if you have any problems or questions after your procedure.  °WHAT TO EXPECT AFTER THE PROCEDURE °After your procedure, it is typical to have the following sensations: °· Minor discomfort or tenderness and a small bump at the catheter insertion site. The bump should usually decrease in size and tenderness within 1 to 2 weeks. °· Any bruising will usually fade within 2 to 4 weeks. °HOME CARE INSTRUCTIONS  °· You may need to keep taking blood thinners if they were prescribed for you. Only take over-the-counter or prescription medicines for pain, fever, or discomfort as directed by your health care provider. °· Do not apply powder or lotion to the site. °· Do not sit in a bathtub, swimming pool, or whirlpool for 5 to 7 days. °· You may shower 24 hours after the procedure. Remove the bandage (dressing) and gently wash the site with plain soap and water. Gently pat the site dry. °· Inspect the site at least twice daily. °· Limit your activity for the first 24 hours. Do not bend, squat, or lift anything over 10 lb (9 kg) or as directed by your health care provider. °· Do not drive home if you are discharged the day of the procedure. Have someone else drive you. Follow instructions about when you can drive or return to work. °SEEK MEDICAL CARE IF: °· You get lightheaded when standing up. °· You have drainage (other than a small amount of blood on the dressing). °· You have chills. °· You have a fever. °· You have redness, warmth, swelling, or pain at the insertion site. °SEEK IMMEDIATE MEDICAL CARE IF:  °· You develop chest pain or shortness of breath, feel  faint, or pass out. °· You have bleeding, swelling larger than a walnut, or drainage from the catheter insertion site. °· You develop pain, discoloration, coldness, or severe bruising in the leg or arm that held the catheter. °· You have heavy bleeding from the site. If this happens, hold pressure on the site. °MAKE SURE YOU: °· Understand these instructions. °· Will watch your condition. °· Will get help right away if you are not doing well or get worse. °Document Released: 05/16/2005 Document Revised: 06/30/2013 Document Reviewed: 03/22/2013 °[employer]® Patient Information ©2014 [employer]. ° °

## 2014-04-18 DIAGNOSIS — G5600 Carpal tunnel syndrome, unspecified upper limb: Secondary | ICD-10-CM | POA: Diagnosis not present

## 2014-04-18 DIAGNOSIS — D492 Neoplasm of unspecified behavior of bone, soft tissue, and skin: Secondary | ICD-10-CM | POA: Diagnosis not present

## 2014-04-18 DIAGNOSIS — M19049 Primary osteoarthritis, unspecified hand: Secondary | ICD-10-CM | POA: Diagnosis not present

## 2014-04-22 ENCOUNTER — Other Ambulatory Visit: Payer: Self-pay | Admitting: Orthopedic Surgery

## 2014-04-25 ENCOUNTER — Encounter (HOSPITAL_BASED_OUTPATIENT_CLINIC_OR_DEPARTMENT_OTHER): Payer: Self-pay | Admitting: *Deleted

## 2014-04-25 NOTE — Progress Notes (Signed)
04/25/14 1125  OBSTRUCTIVE SLEEP APNEA  Have you ever been diagnosed with sleep apnea through a sleep study? No  Do you snore loudly (loud enough to be heard through closed doors)?  0  Do you often feel tired, fatigued, or sleepy during the daytime? 0  Has anyone observed you stop breathing during your sleep? 0  Do you have, or are you being treated for high blood pressure? 1  BMI more than 35 kg/m2? 1  Age over 65 years old? 1  Neck circumference greater than 40 cm/16 inches? 1  Gender: 0  Obstructive Sleep Apnea Score 4  Score 4 or greater  Results sent to PCP

## 2014-04-25 NOTE — Progress Notes (Signed)
To come in for ekg-labs done 04/15/14 Denies any cardiac or resp problems

## 2014-04-26 ENCOUNTER — Other Ambulatory Visit: Payer: Self-pay

## 2014-04-26 ENCOUNTER — Encounter (HOSPITAL_BASED_OUTPATIENT_CLINIC_OR_DEPARTMENT_OTHER)
Admission: RE | Admit: 2014-04-26 | Discharge: 2014-04-26 | Disposition: A | Discharge status: Home / Self Care | Payer: BC Managed Care – PPO | Source: Ambulatory Visit | Attending: Orthopedic Surgery | Admitting: Orthopedic Surgery

## 2014-04-26 DIAGNOSIS — M129 Arthropathy, unspecified: Secondary | ICD-10-CM | POA: Diagnosis not present

## 2014-04-26 DIAGNOSIS — M19049 Primary osteoarthritis, unspecified hand: Secondary | ICD-10-CM | POA: Diagnosis not present

## 2014-04-26 DIAGNOSIS — I10 Essential (primary) hypertension: Secondary | ICD-10-CM | POA: Diagnosis not present

## 2014-04-26 DIAGNOSIS — G5600 Carpal tunnel syndrome, unspecified upper limb: Secondary | ICD-10-CM | POA: Diagnosis not present

## 2014-04-26 DIAGNOSIS — M6740 Ganglion, unspecified site: Secondary | ICD-10-CM | POA: Diagnosis not present

## 2014-04-26 DIAGNOSIS — Z7902 Long term (current) use of antithrombotics/antiplatelets: Secondary | ICD-10-CM | POA: Diagnosis not present

## 2014-04-26 DIAGNOSIS — G479 Sleep disorder, unspecified: Secondary | ICD-10-CM | POA: Diagnosis not present

## 2014-04-26 DIAGNOSIS — Z7982 Long term (current) use of aspirin: Secondary | ICD-10-CM | POA: Diagnosis not present

## 2014-04-26 DIAGNOSIS — Z01810 Encounter for preprocedural cardiovascular examination: Secondary | ICD-10-CM | POA: Diagnosis not present

## 2014-04-26 DIAGNOSIS — E119 Type 2 diabetes mellitus without complications: Secondary | ICD-10-CM | POA: Diagnosis not present

## 2014-04-26 NOTE — Pre-Procedure Instructions (Signed)
EKG reviewed by Dr. Al Corpus, [place] for surgery.

## 2014-04-27 ENCOUNTER — Encounter (HOSPITAL_BASED_OUTPATIENT_CLINIC_OR_DEPARTMENT_OTHER)
Admission: RE | Disposition: A | Discharge status: Home / Self Care | Payer: Self-pay | Source: Ambulatory Visit | Attending: Orthopedic Surgery

## 2014-04-27 ENCOUNTER — Ambulatory Visit (HOSPITAL_BASED_OUTPATIENT_CLINIC_OR_DEPARTMENT_OTHER)
Admission: RE | Admit: 2014-04-27 | Discharge: 2014-04-27 | Disposition: A | Discharge status: Home / Self Care | Payer: BC Managed Care – PPO | Source: Ambulatory Visit | Attending: Orthopedic Surgery | Admitting: Orthopedic Surgery

## 2014-04-27 ENCOUNTER — Ambulatory Visit (HOSPITAL_BASED_OUTPATIENT_CLINIC_OR_DEPARTMENT_OTHER): Payer: BC Managed Care – PPO | Admitting: Anesthesiology

## 2014-04-27 ENCOUNTER — Encounter (HOSPITAL_BASED_OUTPATIENT_CLINIC_OR_DEPARTMENT_OTHER): Payer: Self-pay | Admitting: Orthopedic Surgery

## 2014-04-27 ENCOUNTER — Encounter (HOSPITAL_BASED_OUTPATIENT_CLINIC_OR_DEPARTMENT_OTHER): Payer: BC Managed Care – PPO | Admitting: Anesthesiology

## 2014-04-27 DIAGNOSIS — Z7902 Long term (current) use of antithrombotics/antiplatelets: Secondary | ICD-10-CM | POA: Insufficient documentation

## 2014-04-27 DIAGNOSIS — Z7982 Long term (current) use of aspirin: Secondary | ICD-10-CM | POA: Insufficient documentation

## 2014-04-27 DIAGNOSIS — Z01810 Encounter for preprocedural cardiovascular examination: Secondary | ICD-10-CM | POA: Insufficient documentation

## 2014-04-27 DIAGNOSIS — E119 Type 2 diabetes mellitus without complications: Secondary | ICD-10-CM | POA: Insufficient documentation

## 2014-04-27 DIAGNOSIS — M6740 Ganglion, unspecified site: Secondary | ICD-10-CM | POA: Diagnosis not present

## 2014-04-27 DIAGNOSIS — M19049 Primary osteoarthritis, unspecified hand: Secondary | ICD-10-CM | POA: Insufficient documentation

## 2014-04-27 DIAGNOSIS — D492 Neoplasm of unspecified behavior of bone, soft tissue, and skin: Secondary | ICD-10-CM | POA: Diagnosis not present

## 2014-04-27 DIAGNOSIS — I10 Essential (primary) hypertension: Secondary | ICD-10-CM | POA: Insufficient documentation

## 2014-04-27 DIAGNOSIS — G5600 Carpal tunnel syndrome, unspecified upper limb: Secondary | ICD-10-CM | POA: Insufficient documentation

## 2014-04-27 DIAGNOSIS — G479 Sleep disorder, unspecified: Secondary | ICD-10-CM | POA: Insufficient documentation

## 2014-04-27 HISTORY — PX: MASS EXCISION: SHX2000

## 2014-04-27 HISTORY — DX: Complete loss of teeth, unspecified cause, unspecified class: K08.109

## 2014-04-27 HISTORY — DX: Presence of spectacles and contact lenses: Z97.3

## 2014-04-27 HISTORY — DX: Complete loss of teeth, unspecified cause, unspecified class: Z97.2

## 2014-04-27 HISTORY — DX: Unspecified osteoarthritis, unspecified site: M19.90

## 2014-04-27 LAB — POCT HEMOGLOBIN-HEMACUE: Hemoglobin: 12.9 g/dL (ref 12.0–15.0)

## 2014-04-27 LAB — GLUCOSE, CAPILLARY: GLUCOSE-CAPILLARY: 101 mg/dL — AB (ref 70–99)

## 2014-04-27 SURGERY — EXCISION MASS
Anesthesia: General | Laterality: Right

## 2014-04-27 MED ORDER — PROPOFOL 10 MG/ML IV BOLUS
INTRAVENOUS | Status: DC | PRN
  Administered 2014-04-27: 250 mg via INTRAVENOUS

## 2014-04-27 MED ORDER — LIDOCAINE HCL (CARDIAC) 20 MG/ML IV SOLN
INTRAVENOUS | Status: DC | PRN
  Administered 2014-04-27: 100 mg via INTRAVENOUS

## 2014-04-27 MED ORDER — CEFAZOLIN SODIUM-DEXTROSE 2-3 GM-% IV SOLR: 2.0000 g | INTRAVENOUS | Status: DC

## 2014-04-27 MED ORDER — MIDAZOLAM HCL 2 MG/2ML IJ SOLN: 1.0000 mg | INTRAMUSCULAR | Status: DC | PRN

## 2014-04-27 MED ORDER — HYDROMORPHONE HCL PF 1 MG/ML IJ SOLN
INTRAMUSCULAR | Status: AC
  Filled 2014-04-27: qty 1

## 2014-04-27 MED ORDER — HYDROMORPHONE HCL PF 1 MG/ML IJ SOLN
0.2500 mg | INTRAMUSCULAR | Status: DC | PRN
Start: 2014-04-27 — End: 2014-04-27
  Administered 2014-04-27: 0.5 mg via INTRAVENOUS

## 2014-04-27 MED ORDER — FENTANYL CITRATE 0.05 MG/ML IJ SOLN: 50.0000 ug | INTRAMUSCULAR | Status: DC | PRN

## 2014-04-27 MED ORDER — LACTATED RINGERS IV SOLN
INTRAVENOUS | Status: DC
  Administered 2014-04-27: 10 mL/h via INTRAVENOUS

## 2014-04-27 MED ORDER — FENTANYL CITRATE 0.05 MG/ML IJ SOLN
INTRAMUSCULAR | Status: DC | PRN
  Administered 2014-04-27: 50 ug via INTRAVENOUS

## 2014-04-27 MED ORDER — OXYCODONE HCL 5 MG/5ML PO SOLN: 5.0000 mg | Freq: Once | ORAL | Status: DC | PRN

## 2014-04-27 MED ORDER — ONDANSETRON HCL 4 MG/2ML IJ SOLN
INTRAMUSCULAR | Status: DC | PRN
  Administered 2014-04-27: 4 mg via INTRAVENOUS

## 2014-04-27 MED ORDER — BUPIVACAINE HCL (PF) 0.25 % IJ SOLN
INTRAMUSCULAR | Status: AC
  Filled 2014-04-27: qty 30

## 2014-04-27 MED ORDER — OXYCODONE HCL 5 MG PO TABS: 5.0000 mg | ORAL_TABLET | Freq: Once | ORAL | Status: DC | PRN

## 2014-04-27 MED ORDER — BUPIVACAINE HCL (PF) 0.25 % IJ SOLN
INTRAMUSCULAR | Status: DC | PRN
  Administered 2014-04-27: 7 mL

## 2014-04-27 MED ORDER — CHLORHEXIDINE GLUCONATE 4 % EX LIQD: 60.0000 mL | Freq: Once | CUTANEOUS | Status: DC

## 2014-04-27 MED ORDER — HYDROCODONE-ACETAMINOPHEN 5-325 MG PO TABS: 1.0000 | ORAL_TABLET | Freq: Four times a day (QID) | ORAL | Status: DC | PRN

## 2014-04-27 MED ORDER — FENTANYL CITRATE 0.05 MG/ML IJ SOLN
INTRAMUSCULAR | Status: AC
  Filled 2014-04-27: qty 4

## 2014-04-27 MED ORDER — MIDAZOLAM HCL 2 MG/2ML IJ SOLN
INTRAMUSCULAR | Status: AC
  Filled 2014-04-27: qty 2

## 2014-04-27 MED ORDER — DEXAMETHASONE SODIUM PHOSPHATE 10 MG/ML IJ SOLN
INTRAMUSCULAR | Status: DC | PRN
  Administered 2014-04-27: 4 mg via INTRAVENOUS

## 2014-04-27 MED ORDER — ONDANSETRON HCL 4 MG/2ML IJ SOLN: 4.0000 mg | Freq: Once | INTRAMUSCULAR | Status: DC | PRN

## 2014-04-27 MED ORDER — CEFAZOLIN SODIUM-DEXTROSE 2-3 GM-% IV SOLR
INTRAVENOUS | Status: AC
  Filled 2014-04-27: qty 50

## 2014-04-27 MED ORDER — MIDAZOLAM HCL 5 MG/5ML IJ SOLN
INTRAMUSCULAR | Status: DC | PRN
Start: 2014-04-27 — End: 2014-04-27
  Administered 2014-04-27: 1 mg via INTRAVENOUS

## 2014-04-27 SURGICAL SUPPLY — 48 items
BANDAGE COBAN STERILE 2 (GAUZE/BANDAGES/DRESSINGS) IMPLANT
BLADE MINI RND TIP GREEN BEAV (BLADE) IMPLANT
BLADE SURG 15 STRL LF DISP TIS (BLADE) ×1 IMPLANT
BLADE SURG 15 STRL SS (BLADE) ×1
BNDG COHESIVE 1X5 TAN STRL LF (GAUZE/BANDAGES/DRESSINGS) IMPLANT
BNDG COHESIVE 3X5 TAN STRL LF (GAUZE/BANDAGES/DRESSINGS) IMPLANT
BNDG ESMARK 4X9 LF (GAUZE/BANDAGES/DRESSINGS) ×2 IMPLANT
BNDG GAUZE ELAST 4 BULKY (GAUZE/BANDAGES/DRESSINGS) IMPLANT
CHLORAPREP W/TINT 26ML (MISCELLANEOUS) ×2 IMPLANT
CORDS BIPOLAR (ELECTRODE) ×2 IMPLANT
COVER MAYO STAND STRL (DRAPES) ×2 IMPLANT
COVER TABLE BACK 60X90 (DRAPES) ×2 IMPLANT
CUFF TOURNIQUET SINGLE 18IN (TOURNIQUET CUFF) ×2 IMPLANT
DECANTER SPIKE VIAL GLASS SM (MISCELLANEOUS) IMPLANT
DRAIN PENROSE 1/2X12 LTX STRL (WOUND CARE) IMPLANT
DRAPE EXTREMITY T 121X128X90 (DRAPE) ×2 IMPLANT
DRAPE SURG 17X23 STRL (DRAPES) ×2 IMPLANT
GAUZE SPONGE 4X4 12PLY STRL (GAUZE/BANDAGES/DRESSINGS) ×2 IMPLANT
GAUZE XEROFORM 1X8 LF (GAUZE/BANDAGES/DRESSINGS) ×2 IMPLANT
GLOVE BIOGEL PI IND STRL 7.0 (GLOVE) ×1 IMPLANT
GLOVE BIOGEL PI IND STRL 8.5 (GLOVE) ×1 IMPLANT
GLOVE BIOGEL PI INDICATOR 7.0 (GLOVE) ×1
GLOVE BIOGEL PI INDICATOR 8.5 (GLOVE) ×1
GLOVE SURG ORTHO 8.0 STRL STRW (GLOVE) ×2 IMPLANT
GOWN STRL REUS W/ TWL LRG LVL3 (GOWN DISPOSABLE) ×1 IMPLANT
GOWN STRL REUS W/TWL LRG LVL3 (GOWN DISPOSABLE) ×1
GOWN STRL REUS W/TWL XL LVL3 (GOWN DISPOSABLE) ×2 IMPLANT
NDL SAFETY ECLIPSE 18X1.5 (NEEDLE) IMPLANT
NEEDLE 27GAX1X1/2 (NEEDLE) ×2 IMPLANT
NEEDLE HYPO 18GX1.5 SHARP (NEEDLE)
NS IRRIG 1000ML POUR BTL (IV SOLUTION) ×2 IMPLANT
PACK BASIN DAY SURGERY FS (CUSTOM PROCEDURE TRAY) ×2 IMPLANT
PAD CAST 3X4 CTTN HI CHSV (CAST SUPPLIES) IMPLANT
PADDING CAST ABS 3INX4YD NS (CAST SUPPLIES)
PADDING CAST ABS 4INX4YD NS (CAST SUPPLIES) ×1
PADDING CAST ABS COTTON 3X4 (CAST SUPPLIES) IMPLANT
PADDING CAST ABS COTTON 4X4 ST (CAST SUPPLIES) ×1 IMPLANT
PADDING CAST COTTON 3X4 STRL (CAST SUPPLIES)
SPLINT PLASTER CAST XFAST 3X15 (CAST SUPPLIES) IMPLANT
SPLINT PLASTER XTRA FASTSET 3X (CAST SUPPLIES)
STOCKINETTE 4X48 STRL (DRAPES) ×2 IMPLANT
SUT VIC AB 4-0 P2 18 (SUTURE) IMPLANT
SUT VICRYL RAPID 5 0 P 3 (SUTURE) IMPLANT
SUT VICRYL RAPIDE 4/0 PS 2 (SUTURE) ×2 IMPLANT
SYR BULB 3OZ (MISCELLANEOUS) ×2 IMPLANT
SYR CONTROL 10ML LL (SYRINGE) IMPLANT
TOWEL OR 17X24 6PK STRL BLUE (TOWEL DISPOSABLE) ×2 IMPLANT
UNDERPAD 30X30 INCONTINENT (UNDERPADS AND DIAPERS) ×2 IMPLANT

## 2014-04-27 NOTE — Discharge Instructions (Addendum)

## 2014-04-27 NOTE — Anesthesia Procedure Notes (Signed)
Procedure Name: LMA Insertion Date/Time: 04/27/2014 12:44 PM Performed by: Lieutenant Diego Pre-anesthesia Checklist: Patient identified, Emergency Drugs available, Suction available and Patient being monitored Patient Re-evaluated:Patient Re-evaluated prior to inductionOxygen Delivery Method: Circle System Utilized Preoxygenation: Pre-oxygenation with 100% oxygen Intubation Type: IV induction Ventilation: Mask ventilation without difficulty LMA: LMA inserted LMA Size: 4.0 Number of attempts: 1 Airway Equipment and Method: bite block Placement Confirmation: positive ETCO2 and breath sounds checked- equal and bilateral Tube secured with: Tape Dental Injury: Teeth and Oropharynx as per pre-operative assessment

## 2014-04-27 NOTE — Brief Op Note (Signed)
04/27/2014  1:30 PM  PATIENT:  Angelina Pih  63 y.o. female  PRE-OPERATIVE DIAGNOSIS:  MASS RING FINGER RIGHT DEGENERATIVE ARTHRITIS Proximal Interphalangeal JOINT RIGHT RING FINGER   POST-OPERATIVE DIAGNOSIS:  MASS RING FINGER RIGHT DEGENERATIVE ARTHRITIS Proximal Interphalangeal JOINT RIGHT RING FINGER   PROCEDURE:  Procedure(s): EXCISION MASS RIGHT RING FINGER DEBRIDEMENT PROXIMAL INTERPHALANGEAL JOINT RIGHT RING FINGER (PIP) (Right)  SURGEON:  Surgeon(s) and Role:    * Wynonia Sours, MD - Primary  PHYSICIAN ASSISTANT:   ASSISTANTS: none   ANESTHESIA:   local and general  EBL:  Total I/O In: 600 [I.V.:600] Out: -   BLOOD ADMINISTERED:none  DRAINS: none   LOCAL MEDICATIONS USED:  BUPIVICAINE   SPECIMEN:  Excision  DISPOSITION OF SPECIMEN:  PATHOLOGY  COUNTS:  YES  TOURNIQUET:   Total Tourniquet Time Documented: Upper Arm (Right) - 19 minutes Total: Upper Arm (Right) - 19 minutes   DICTATION: .Other Dictation: Dictation Number [id number]  PLAN OF CARE: Discharge to home after PACU  PATIENT DISPOSITION:  PACU - hemodynamically stable.

## 2014-04-27 NOTE — Anesthesia Postprocedure Evaluation (Signed)
  Anesthesia Post-op Note  Patient: Cynthia Frost  Procedure(s) Performed: Procedure(s): EXCISION MASS RIGHT RING FINGER DEBRIDEMENT PROXIMAL INTERPHALANGEAL JOINT RIGHT RING FINGER (PIP) (Right)  Patient Location: PACU  Anesthesia Type:General  Level of Consciousness: awake, alert  and oriented  Airway and Oxygen Therapy: Patient Spontanous Breathing  Post-op Pain: none  Post-op Assessment: Post-op Vital signs reviewed  Post-op Vital Signs: Reviewed  Last Vitals:  Filed Vitals:   04/27/14 1438  BP:   Pulse:   Temp: 36.3 C  Resp:     Complications: No apparent anesthesia complications

## 2014-04-27 NOTE — Transfer of Care (Signed)
Immediate Anesthesia Transfer of Care Note  Patient: Cynthia Frost  Procedure(s) Performed: Procedure(s): EXCISION MASS RIGHT RING FINGER DEBRIDEMENT PROXIMAL INTERPHALANGEAL JOINT RIGHT RING FINGER (PIP) (Right)  Patient Location: PACU  Anesthesia Type:General  Level of Consciousness: awake  Airway & Oxygen Therapy: Patient Spontanous Breathing and Patient connected to face mask oxygen  Post-op Assessment: Report given to PACU RN and Post -op Vital signs reviewed and stable  Post vital signs: Reviewed and stable  Complications: No apparent anesthesia complications

## 2014-04-27 NOTE — Op Note (Signed)
Dictation Number [id number]

## 2014-04-27 NOTE — H&P (Signed)
Cynthia Frost is a 63 year-old right-hand dominant female with a mass on the PIP joint of her right ring finger, dorsoulnar aspect.  This has been present for approximately two months. She complains of pain and swelling with this.  She has an intermittent, mild aching type pain with the feeling of swelling.  She states it is getting worse. She recalls no history of injury.  She has history of diabetes and arthritis.  Her diabetes is pre-diabetic.  There is family history of diabetes.  She is awakened occasionally at night with discomfort.  She has history of carpal tunnel syndrome. She has had her nerve conductions done by Dr. Zebedee Iba revealing bilateral carpal tunnel syndrome with a motor delay of 6.3 on the right, 4.4 on the left, sensory delay of 3.9 on the right and 3.1 on the left. Amplitude diminution to 12 on the right and 28.7 on the left. ALLERGIES:    None.  MEDICATIONS:    Clopidogrel, amlodipine, doxazosin, losartan/HCTZ and carvedilol.   SURGICAL HISTORY:    None.  FAMILY MEDICAL HISTORY:    Positive for diabetes, heart disease, high blood pressure and arthritis.  SOCIAL HISTORY:     She does not smoke, drinks socially.  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  REVIEW OF SYSTEMS:   Positive for glasses, high blood pressure, sleep disorder, otherwise negative. Cynthia Frost is an 63 y.o. female.   Chief Complaint: mass right ring finger HPI: see above  Past Medical History  Diagnosis Date  . Hypertension   . Stenosis of right middle cerebral artery 2011  . Wears glasses   . Full dentures   . Diabetes mellitus without complication     no meds yet  . Arthritis     Past Surgical History  Procedure Laterality Date  . Colonoscopy    . Diagnostic laparoscopy      fibroid  . Cerebral angiogram      History reviewed. No pertinent family history. Social History:  reports that she has never smoked. She does not have any smokeless tobacco history on file. She reports  that she does not drink alcohol or use illicit drugs.  Allergies: No Known Allergies  Medications Prior to Admission  Medication Sig Dispense Refill  . amLODipine (NORVASC) 5 MG tablet Take 5 mg by mouth daily.      Marland Kitchen aspirin 81 MG tablet Take 81 mg by mouth daily.      . B Complex-Folic Acid (B COMPLEX-VITAMIN B12 PO) Take 1 mL by mouth daily.      . carvedilol (COREG) 25 MG tablet Take 25 mg by mouth daily.       . clopidogrel (PLAVIX) 75 MG tablet Take 75 mg by mouth daily.      Marland Kitchen doxazosin (CARDURA) 8 MG tablet Take 8 mg by mouth daily.       . Glucosamine Sulfate (CVS GLUCOSAMINE SULFATE) 1000 MG CAPS Take 1,000 mg by mouth daily.      Marland Kitchen losartan-hydrochlorothiazide (HYZAAR) 100-25 MG per tablet Take 1 tablet by mouth daily.      . Turmeric Curcumin 500 MG CAPS Take 1,000 mg by mouth daily.         No results found for this or any previous visit (from the past 48 hour(s)).  No results found.   Pertinent items are noted in HPI.  Blood pressure 151/71, pulse 53, temperature 98.4 F (36.9 C), temperature source Oral, resp. rate 16, height 5\' 5"  (1.651 m),  weight 115.214 kg (254 lb), SpO2 95.00%.  General appearance: alert, cooperative and appears stated age Head: Normocephalic, without obvious abnormality Neck: no JVD Resp: clear to auscultation bilaterally Cardio: regular rate and rhythm, S1, S2 normal, no murmur, click, rub or gallop GI: soft, non-tender; bowel sounds normal; no masses,  no organomegaly Extremities: mass pip joint rrf Pulses: 2+ and symmetric Skin: Skin color, texture, turgor normal. No rashes or lesions Neurologic: Grossly normal Incision/Wound: na  Assessment/Plan We have discussed with her the possibility of excision mucoid cyst with debridement of PIP joint right ring finger, possibility of carpal tunnel release at the same time. She would like to forego the carpal tunnel release at the present time. She is advised there is potential for exacerbation  with any surgery to the carpal tunnel syndrome but she would like to not undergo release of the carpal canal  on the right at the present time. She is scheduled for excision mucoid cyst, debridement of PIP joint ring finger as an outpatient under regional anesthesia. The pre, peri and post op course are discussed along with risks and complications.  She is aware there is no guarantee with surgery, possibility of infection, recurrence, injury to arteries, nerves and tendons, incomplete relief of symptoms and dystrophy.  She is advised of various treatment alternatives of carpal tunnel syndrome at the present time but would like to forego that.  KUZMA,GARY R 04/27/2014, 12:16 PM

## 2014-04-27 NOTE — Anesthesia Preprocedure Evaluation (Addendum)
Anesthesia Evaluation    Airway Mallampati: I TM Distance: >3 FB Neck ROM: Full    Dental  (+) Edentulous Upper, Edentulous Lower, Dental Advisory Given   Pulmonary  breath sounds clear to auscultation        Cardiovascular hypertension, Pt. on medications and Pt. on home beta blockers Rhythm:Regular Rate:Normal     Neuro/Psych    GI/Hepatic   Endo/Other  diabetes (Pt states she is  "pre-diabetic".)Morbid obesity  Renal/GU      Musculoskeletal   Abdominal   Peds  Hematology   Anesthesia Other Findings   Reproductive/Obstetrics                          Anesthesia Physical Anesthesia Plan  ASA: III  Anesthesia Plan: General   Post-op Pain Management:    Induction: Intravenous  Airway Management Planned: LMA  Additional Equipment:   Intra-op Plan:   Post-operative Plan: Extubation in OR  Informed Consent: I have reviewed the patients History and Physical, chart, labs and discussed the procedure including the risks, benefits and alternatives for the proposed anesthesia with the patient or authorized representative who has indicated his/her understanding and acceptance.   Dental advisory given  Plan Discussed with: CRNA, Anesthesiologist and Surgeon  Anesthesia Plan Comments:         Anesthesia Quick Evaluation

## 2014-04-27 NOTE — Op Note (Signed)
Cynthia Frost, Cynthia Frost             ACCOUNT NO.:  [id number]  MEDICAL RECORD NO.:  [id number]  LOCATION:                                 FACILITY:  PHYSICIAN:  Daryll Brod, M.D.       DATE OF BIRTH:  1950-11-29  DATE OF PROCEDURE:  04/27/2014 DATE OF DISCHARGE:                              OPERATIVE REPORT   PREOPERATIVE DIAGNOSIS:  Mass, proximal interphalangeal joint, right ring finger with degenerative arthritis, proximal interphalangeal joint.  POSTOPERATIVE DIAGNOSIS:  Mass, proximal interphalangeal joint, right ring finger with degenerative arthritis, proximal interphalangeal joint.  OPERATION:  Excision of cyst with debridement of proximal interphalangeal joint, right ring finger.  SURGEON:  Daryll Brod, M.D.  ANESTHESIA:  General with local metacarpal block.  ANESTHESIOLOGIST:  Lorrene Reid, M.D.  HISTORY:  The patient is a 63 year old female with a history of a mass at the PIP joint of her right ring finger.  She has elected to undergo surgical excision.  She is aware of risks and complications.  She does have a carpal tunnel syndrome on that side.  Nerve conductions positive. Has elected not to have that done at the same time.  She is aware of risks and complications including infection; recurrence of injury to arteries, nerves, tendons; incomplete relief of symptoms; dystrophy; possibility of stiffness to the PIP joint secondary to the debridement. In the preoperative area, the patient was seen, the extremity marked by both the patient and surgeon.  Antibiotic given.  PROCEDURE IN DETAIL:  The patient was brought to the operating room where general anesthetic was carried out without difficulty under the direction of Dr. Al Corpus.  She was prepped using ChloraPrep in supine position, right arm free.  A 3-minute dry time was allowed.  Time-out taken, confirming the patient and procedure.  The limb was exsanguinated with an Esmarch bandage.  Tourniquet placed high on the  arm was inflated to 250 mmHg.  A midlateral incision was made over the ulnar aspect of the right ring finger at the PIP joint, was carried dorsally over the top of the mass.  In making the incision, the mass was directly adherent to the skin and the mass opened, draining a clear mucinous fluid.  This was then isolated, dissected free.  The stalk was followed into the PIP joint.  An incision was made just volar to the lateral band through the retinacular ligament.  This allowed opening of the joint.  Very large osteophyte was present on the ulnar condyle of the middle phalanx.  This was removed with a rongeur and a Soil scientist.  This was then smoothed with a curette and small rongeur.  A synovectomy was performed.  The wound was copiously irrigated with saline and the skin closed with interrupted 4-0 Vicryl Rapide sutures.  Metacarpal block was then given with 0.25% Marcaine without epinephrine, approximately 8 mL was used. A sterile compressive dressing and splint to the PIP joint in extension was applied.  On deflation of the tourniquet, remaining fingers pinked. She was taken to the recovery room for observation in satisfactory condition.  She will be discharged home to return to the [facility] in 1 week on Vicodin.  ______________________________ Daryll Brod, M.D.     GK/MEDQ  D:  04/27/2014  T:  04/27/2014  Job:  [id number]

## 2014-04-28 ENCOUNTER — Encounter (HOSPITAL_BASED_OUTPATIENT_CLINIC_OR_DEPARTMENT_OTHER): Payer: Self-pay | Admitting: Orthopedic Surgery

## 2014-05-02 ENCOUNTER — Encounter: Payer: Self-pay | Admitting: *Deleted

## 2014-05-02 ENCOUNTER — Encounter: Payer: BC Managed Care – PPO | Attending: Family Medicine | Admitting: *Deleted

## 2014-05-02 VITALS — Ht 65.0 in | Wt 255.5 lb

## 2014-05-02 DIAGNOSIS — E119 Type 2 diabetes mellitus without complications: Secondary | ICD-10-CM

## 2014-05-02 DIAGNOSIS — E669 Obesity, unspecified: Secondary | ICD-10-CM | POA: Diagnosis not present

## 2014-05-02 DIAGNOSIS — Z713 Dietary counseling and surveillance: Secondary | ICD-10-CM | POA: Insufficient documentation

## 2014-05-02 NOTE — Progress Notes (Signed)
Medical Nutrition Therapy:  Appt start time: 0830 end time:  0930.  Assessment:  Patient here today for type 2 diabetes and carbohydrate counting refresher. She was diagnosed with DM in 2010, but was not on medications due to an A1c of 6.4. Her A1c in May 2015 increased slightly to 6.7 and she gained about 20 pounds over the last year. 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 She has 6 weeks off of work, and is taking this time to sleep and eat better, and exercise more. She has been following the [employer] plan, which appears to be a balanced eating plan. He biggest issue with eating is stress snacking, reporting high intake of nuts on days when she works late. She is not currently checking her blood glucose, but has an appointment with CDE tomorrow morning to teach on glucometer use.    MEDICATIONS: See list   DIETARY INTAKE:   Usual eating pattern includes 3 meals and 1-2 snacks per day.  24-hr recall:  B ( AM): Oatmeal (1 cup), 2 eggs with spinach/broccoli, [place] bacon, water/hot tea with Stevia Snk ( AM): Occasionally, apple  L ( PM): Bean Stew Haley Pomeroy recipe, green tea Snk ( PM): Nuts, apple, peanut/almond butter D ( PM): Meat/beans, vegetables, potato Snk ( PM): None Beverages: Water, Crystal Light, hot tea  Usual physical activity: Walking/stationary bike 10-20 minutes, Yoga in the morning, 2 days resistance bands  Estimated energy needs: 1500 calories 169 g carbohydrates 94 g protein 50 g fat  Progress Towards Goal(s):  In progress.   Nutritional Diagnosis:  NB-1.1 Food and nutrition-related knowledge deficit As related to diabetes and weight management.  As evidenced by no prior education.    Intervention:  Nutrition counseling. We discussed basic carb counting, including foods with carbs, label reading, portion size, and meal planning. We also discussed strategies  for weight management, including balancing meals, healthy snacks, and exercise.   Goals:  1. 2-3 carb servings at meals, 1 serving at snacks.  2. 0.5-1 pound weight loss per week.  3. Balance protein, healthy carbs and fats, and vegetables at meals.  4. Monitor portion size of foods.  5. Pre-portion healthy snacks to bring to work.  6. Exercise at least 3-4 days weekly.   Handouts given during visit include:  1500 calorie meal plan  Meal plan card  Monitoring/Evaluation:  Dietary intake, exercise, blood glucose, and body weight prn.

## 2014-05-03 ENCOUNTER — Encounter: Payer: BC Managed Care – PPO | Admitting: *Deleted

## 2014-05-03 DIAGNOSIS — E119 Type 2 diabetes mellitus without complications: Secondary | ICD-10-CM

## 2014-05-11 NOTE — Progress Notes (Signed)
Blood Glucose Monitoring Instruction  Appointment Start Time: 9458 Appointment End Time: 1100  Assessment:  Primary concerns today: Patient here for instruction on Blood Glucose Monitoring. They do have their own meter at this time.  Meter Provided:   No   Medications: see list     Intervention:    Explained rationale of testing BG to obtain data as to how their diabetes is being managed.  Provided Target Ranges for both pre and post meals  Explained factors that effect BG including food (carbohydrate), stress, activity level and insulin availability in the body including diabetes medications  Taught patient techniques for using BG monitor and lancing device  Discussed need for Rx for strips and lancets   Explained rationale of recording BG both for patient and MD to assess patterns as needed.  Follow Up: Patient offered follow up as needed.

## 2014-06-14 ENCOUNTER — Ambulatory Visit: Payer: Self-pay | Admitting: Podiatry

## 2014-06-14 ENCOUNTER — Other Ambulatory Visit: Payer: Self-pay | Admitting: Podiatry

## 2014-06-14 DIAGNOSIS — M79609 Pain in unspecified limb: Secondary | ICD-10-CM | POA: Diagnosis not present

## 2014-06-14 DIAGNOSIS — L608 Other nail disorders: Secondary | ICD-10-CM | POA: Diagnosis not present

## 2014-06-14 DIAGNOSIS — IMO0002 Reserved for concepts with insufficient information to code with codable children: Secondary | ICD-10-CM | POA: Diagnosis not present

## 2014-07-05 DIAGNOSIS — M79609 Pain in unspecified limb: Secondary | ICD-10-CM | POA: Diagnosis not present

## 2014-07-07 ENCOUNTER — Encounter: Payer: Self-pay | Admitting: *Deleted

## 2014-08-08 DIAGNOSIS — G479 Sleep disorder, unspecified: Secondary | ICD-10-CM | POA: Diagnosis not present

## 2014-08-08 DIAGNOSIS — Z23 Encounter for immunization: Secondary | ICD-10-CM | POA: Diagnosis not present

## 2014-08-29 DIAGNOSIS — Z1231 Encounter for screening mammogram for malignant neoplasm of breast: Secondary | ICD-10-CM | POA: Diagnosis not present

## 2014-08-29 DIAGNOSIS — Z803 Family history of malignant neoplasm of breast: Secondary | ICD-10-CM | POA: Diagnosis not present

## 2014-11-10 ENCOUNTER — Other Ambulatory Visit (HOSPITAL_COMMUNITY): Payer: Self-pay | Admitting: Interventional Radiology

## 2014-11-10 ENCOUNTER — Telehealth (HOSPITAL_COMMUNITY): Payer: Self-pay | Admitting: Interventional Radiology

## 2014-11-10 DIAGNOSIS — I771 Stricture of artery: Secondary | ICD-10-CM

## 2014-11-10 NOTE — Telephone Encounter (Signed)
Called pt, left VM for her to call to schedule 6 month f/u CTA brain and CT perfusion JM

## 2015-01-05 ENCOUNTER — Other Ambulatory Visit (HOSPITAL_COMMUNITY): Payer: Self-pay | Admitting: Interventional Radiology

## 2015-01-05 DIAGNOSIS — I771 Stricture of artery: Secondary | ICD-10-CM

## 2015-01-31 ENCOUNTER — Ambulatory Visit (HOSPITAL_COMMUNITY)
Admission: RE | Admit: 2015-01-31 | Discharge: 2015-01-31 | Disposition: A | Discharge status: Home / Self Care | Payer: BC Managed Care – PPO | Source: Ambulatory Visit | Attending: Interventional Radiology | Admitting: Interventional Radiology

## 2015-01-31 ENCOUNTER — Other Ambulatory Visit (HOSPITAL_COMMUNITY): Payer: Self-pay | Admitting: Interventional Radiology

## 2015-01-31 DIAGNOSIS — I771 Stricture of artery: Secondary | ICD-10-CM

## 2015-01-31 DIAGNOSIS — I6611 Occlusion and stenosis of right anterior cerebral artery: Secondary | ICD-10-CM | POA: Diagnosis not present

## 2015-03-27 DIAGNOSIS — M170 Bilateral primary osteoarthritis of knee: Secondary | ICD-10-CM | POA: Diagnosis not present

## 2015-04-21 ENCOUNTER — Telehealth (HOSPITAL_COMMUNITY): Payer: Self-pay

## 2015-04-21 NOTE — Telephone Encounter (Signed)
Spoke with pt, told her that insurance did not approve CT angio. Pt does not want to do an angiogram instead so she will just wait until she decides what she wants to do. AW

## 2015-04-24 ENCOUNTER — Ambulatory Visit (HOSPITAL_COMMUNITY): Payer: BC Managed Care – PPO

## 2015-06-04 DIAGNOSIS — J209 Acute bronchitis, unspecified: Secondary | ICD-10-CM | POA: Diagnosis not present

## 2015-07-11 DIAGNOSIS — M25562 Pain in left knee: Secondary | ICD-10-CM | POA: Diagnosis not present

## 2015-07-11 DIAGNOSIS — R2689 Other abnormalities of gait and mobility: Secondary | ICD-10-CM | POA: Diagnosis not present

## 2015-07-11 DIAGNOSIS — M25661 Stiffness of right knee, not elsewhere classified: Secondary | ICD-10-CM | POA: Diagnosis not present

## 2015-07-11 DIAGNOSIS — M25561 Pain in right knee: Secondary | ICD-10-CM | POA: Diagnosis not present

## 2015-07-11 DIAGNOSIS — M25662 Stiffness of left knee, not elsewhere classified: Secondary | ICD-10-CM | POA: Diagnosis not present

## 2015-07-24 DIAGNOSIS — M25662 Stiffness of left knee, not elsewhere classified: Secondary | ICD-10-CM | POA: Diagnosis not present

## 2015-07-24 DIAGNOSIS — M25561 Pain in right knee: Secondary | ICD-10-CM | POA: Diagnosis not present

## 2015-07-24 DIAGNOSIS — M25661 Stiffness of right knee, not elsewhere classified: Secondary | ICD-10-CM | POA: Diagnosis not present

## 2015-07-24 DIAGNOSIS — M25562 Pain in left knee: Secondary | ICD-10-CM | POA: Diagnosis not present

## 2015-07-26 DIAGNOSIS — M25662 Stiffness of left knee, not elsewhere classified: Secondary | ICD-10-CM | POA: Diagnosis not present

## 2015-07-26 DIAGNOSIS — M25561 Pain in right knee: Secondary | ICD-10-CM | POA: Diagnosis not present

## 2015-07-26 DIAGNOSIS — M25661 Stiffness of right knee, not elsewhere classified: Secondary | ICD-10-CM | POA: Diagnosis not present

## 2015-07-26 DIAGNOSIS — M25562 Pain in left knee: Secondary | ICD-10-CM | POA: Diagnosis not present

## 2015-07-31 DIAGNOSIS — M25662 Stiffness of left knee, not elsewhere classified: Secondary | ICD-10-CM | POA: Diagnosis not present

## 2015-07-31 DIAGNOSIS — M25562 Pain in left knee: Secondary | ICD-10-CM | POA: Diagnosis not present

## 2015-07-31 DIAGNOSIS — M25661 Stiffness of right knee, not elsewhere classified: Secondary | ICD-10-CM | POA: Diagnosis not present

## 2015-07-31 DIAGNOSIS — M25561 Pain in right knee: Secondary | ICD-10-CM | POA: Diagnosis not present

## 2015-09-03 DIAGNOSIS — J209 Acute bronchitis, unspecified: Secondary | ICD-10-CM | POA: Diagnosis not present

## 2015-09-05 ENCOUNTER — Other Ambulatory Visit (HOSPITAL_COMMUNITY): Payer: Self-pay | Admitting: Interventional Radiology

## 2015-09-05 DIAGNOSIS — I771 Stricture of artery: Secondary | ICD-10-CM

## 2015-09-19 ENCOUNTER — Other Ambulatory Visit (HOSPITAL_COMMUNITY): Payer: Self-pay | Admitting: Interventional Radiology

## 2015-09-19 ENCOUNTER — Ambulatory Visit (HOSPITAL_COMMUNITY)
Admission: RE | Admit: 2015-09-19 | Discharge: 2015-09-19 | Disposition: A | Discharge status: Home / Self Care | Payer: BC Managed Care – PPO | Source: Ambulatory Visit | Attending: Interventional Radiology | Admitting: Interventional Radiology

## 2015-09-19 DIAGNOSIS — I771 Stricture of artery: Secondary | ICD-10-CM

## 2015-09-19 LAB — POCT I-STAT CREATININE: Creatinine, Ser: 0.9 mg/dL (ref 0.44–1.00)

## 2015-09-19 MED ORDER — IOHEXOL 350 MG/ML SOLN: 90.0000 mL | Freq: Once | INTRAVENOUS | Status: DC | PRN

## 2015-09-21 ENCOUNTER — Telehealth (HOSPITAL_COMMUNITY): Payer: Self-pay

## 2015-09-21 DIAGNOSIS — N644 Mastodynia: Secondary | ICD-10-CM | POA: Diagnosis not present

## 2015-09-21 DIAGNOSIS — R239 Unspecified skin changes: Secondary | ICD-10-CM | POA: Diagnosis not present

## 2015-09-21 NOTE — Telephone Encounter (Signed)
Called pt to reschedule CT, left message for her to call back. AW

## 2015-09-26 ENCOUNTER — Other Ambulatory Visit: Payer: Self-pay | Admitting: Nurse Practitioner

## 2015-09-26 DIAGNOSIS — N644 Mastodynia: Secondary | ICD-10-CM

## 2015-10-04 DIAGNOSIS — J069 Acute upper respiratory infection, unspecified: Secondary | ICD-10-CM | POA: Diagnosis not present

## 2015-10-04 DIAGNOSIS — I10 Essential (primary) hypertension: Secondary | ICD-10-CM | POA: Diagnosis not present

## 2015-10-04 DIAGNOSIS — J9801 Acute bronchospasm: Secondary | ICD-10-CM | POA: Diagnosis not present

## 2015-10-19 ENCOUNTER — Telehealth (HOSPITAL_COMMUNITY): Payer: Self-pay

## 2015-10-19 NOTE — Telephone Encounter (Signed)
Called to reschedule CT, left message for pt to call back. AW

## 2015-10-30 DIAGNOSIS — E1129 Type 2 diabetes mellitus with other diabetic kidney complication: Secondary | ICD-10-CM | POA: Diagnosis not present

## 2015-10-30 DIAGNOSIS — E113299 Type 2 diabetes mellitus with mild nonproliferative diabetic retinopathy without macular edema, unspecified eye: Secondary | ICD-10-CM | POA: Diagnosis not present

## 2015-10-30 DIAGNOSIS — R809 Proteinuria, unspecified: Secondary | ICD-10-CM | POA: Diagnosis not present

## 2015-10-30 DIAGNOSIS — Z6841 Body Mass Index (BMI) 40.0 and over, adult: Secondary | ICD-10-CM | POA: Diagnosis not present

## 2015-10-30 DIAGNOSIS — I10 Essential (primary) hypertension: Secondary | ICD-10-CM | POA: Diagnosis not present

## 2015-10-30 DIAGNOSIS — I679 Cerebrovascular disease, unspecified: Secondary | ICD-10-CM | POA: Diagnosis not present

## 2015-10-30 DIAGNOSIS — E041 Nontoxic single thyroid nodule: Secondary | ICD-10-CM | POA: Diagnosis not present

## 2015-10-30 DIAGNOSIS — E1139 Type 2 diabetes mellitus with other diabetic ophthalmic complication: Secondary | ICD-10-CM | POA: Diagnosis not present

## 2015-10-30 DIAGNOSIS — Z23 Encounter for immunization: Secondary | ICD-10-CM | POA: Diagnosis not present

## 2015-12-13 ENCOUNTER — Other Ambulatory Visit (HOSPITAL_COMMUNITY): Payer: Self-pay | Admitting: Interventional Radiology

## 2015-12-13 ENCOUNTER — Telehealth (HOSPITAL_COMMUNITY): Payer: Self-pay | Admitting: Interventional Radiology

## 2015-12-13 DIAGNOSIS — I771 Stricture of artery: Secondary | ICD-10-CM

## 2015-12-13 NOTE — Telephone Encounter (Signed)
Called pt, left VM on home phone and cell phone to call me back to mover her cerebral angio appointment date on 02/19/16 as Dev will not be here that day. JM

## 2015-12-28 DIAGNOSIS — N644 Mastodynia: Secondary | ICD-10-CM | POA: Diagnosis not present

## 2016-01-10 DIAGNOSIS — M170 Bilateral primary osteoarthritis of knee: Secondary | ICD-10-CM | POA: Diagnosis not present

## 2016-02-13 ENCOUNTER — Telehealth (HOSPITAL_COMMUNITY): Payer: Self-pay

## 2016-02-13 NOTE — Telephone Encounter (Signed)
Called to reschedule angio on 02/20/16, left a message for pt to call back. AW

## 2016-02-20 ENCOUNTER — Ambulatory Visit (HOSPITAL_COMMUNITY): Admission: RE | Admit: 2016-02-20 | Payer: BC Managed Care – PPO | Source: Ambulatory Visit

## 2016-04-19 ENCOUNTER — Other Ambulatory Visit: Payer: Self-pay | Admitting: Radiology

## 2016-04-22 ENCOUNTER — Other Ambulatory Visit: Payer: Self-pay | Admitting: Radiology

## 2016-04-23 ENCOUNTER — Encounter (HOSPITAL_COMMUNITY): Payer: Self-pay

## 2016-04-23 ENCOUNTER — Ambulatory Visit (HOSPITAL_COMMUNITY)
Admission: RE | Admit: 2016-04-23 | Discharge: 2016-04-23 | Disposition: A | Discharge status: Home / Self Care | Payer: BC Managed Care – PPO | Source: Ambulatory Visit | Attending: Interventional Radiology | Admitting: Interventional Radiology

## 2016-04-23 DIAGNOSIS — Z7982 Long term (current) use of aspirin: Secondary | ICD-10-CM | POA: Insufficient documentation

## 2016-04-23 DIAGNOSIS — M1990 Unspecified osteoarthritis, unspecified site: Secondary | ICD-10-CM | POA: Insufficient documentation

## 2016-04-23 DIAGNOSIS — Z7902 Long term (current) use of antithrombotics/antiplatelets: Secondary | ICD-10-CM | POA: Insufficient documentation

## 2016-04-23 DIAGNOSIS — Z538 Procedure and treatment not carried out for other reasons: Secondary | ICD-10-CM | POA: Diagnosis not present

## 2016-04-23 DIAGNOSIS — I10 Essential (primary) hypertension: Secondary | ICD-10-CM | POA: Diagnosis not present

## 2016-04-23 DIAGNOSIS — I771 Stricture of artery: Secondary | ICD-10-CM

## 2016-04-23 DIAGNOSIS — I6601 Occlusion and stenosis of right middle cerebral artery: Secondary | ICD-10-CM | POA: Insufficient documentation

## 2016-04-23 DIAGNOSIS — Z79899 Other long term (current) drug therapy: Secondary | ICD-10-CM | POA: Diagnosis not present

## 2016-04-23 DIAGNOSIS — E119 Type 2 diabetes mellitus without complications: Secondary | ICD-10-CM | POA: Diagnosis not present

## 2016-04-23 LAB — BASIC METABOLIC PANEL
Anion gap: 9 (ref 5–15)
BUN: 21 mg/dL — ABNORMAL HIGH (ref 6–20)
CO2: 28 mmol/L (ref 22–32)
CREATININE: 0.88 mg/dL (ref 0.44–1.00)
Calcium: 9.5 mg/dL (ref 8.9–10.3)
Chloride: 102 mmol/L (ref 101–111)
GFR calc non Af Amer: >60 mL/min (ref >60)
Glucose, Bld: 143 mg/dL — ABNORMAL HIGH (ref 65–99)
Potassium: 5.4 mmol/L — ABNORMAL HIGH (ref 3.5–5.1)
SODIUM: 139 mmol/L (ref 135–145)

## 2016-04-23 LAB — CBC
HCT: 40.2 % (ref 36.0–46.0)
Hemoglobin: 13.4 g/dL (ref 12.0–15.0)
MCH: 28.7 pg (ref 26.0–34.0)
MCHC: 33.3 g/dL (ref 30.0–36.0)
MCV: 86.1 fL (ref 78.0–100.0)
PLATELETS: 297 10*3/uL (ref 150–400)
RBC: 4.67 MIL/uL (ref 3.87–5.11)
RDW: 14.6 % (ref 11.5–15.5)
WBC: 10 10*3/uL (ref 4.0–10.5)

## 2016-04-23 LAB — APTT: APTT: 31 s (ref 24–37)

## 2016-04-23 LAB — PROTIME-INR
INR: 1.16 (ref 0.00–1.49)
PROTHROMBIN TIME: 15 s (ref 11.6–15.2)

## 2016-04-23 MED ORDER — IOPAMIDOL (ISOVUE-300) INJECTION 61%
INTRAVENOUS | Status: AC
  Filled 2016-04-23: qty 150

## 2016-04-23 MED ORDER — LIDOCAINE HCL 1 % IJ SOLN
INTRAMUSCULAR | Status: AC
  Filled 2016-04-23: qty 20

## 2016-04-23 MED ORDER — FENTANYL CITRATE (PF) 100 MCG/2ML IJ SOLN
INTRAMUSCULAR | Status: AC | PRN
  Administered 2016-04-23: 25 ug via INTRAVENOUS

## 2016-04-23 MED ORDER — FENTANYL CITRATE (PF) 100 MCG/2ML IJ SOLN
INTRAMUSCULAR | Status: AC
  Filled 2016-04-23: qty 2

## 2016-04-23 MED ORDER — HEPARIN SODIUM (PORCINE) 1000 UNIT/ML IJ SOLN
INTRAMUSCULAR | Status: AC
  Filled 2016-04-23: qty 1

## 2016-04-23 MED ORDER — MIDAZOLAM HCL 2 MG/2ML IJ SOLN
INTRAMUSCULAR | Status: AC
  Filled 2016-04-23: qty 2

## 2016-04-23 MED ORDER — SODIUM CHLORIDE 0.9 % IV SOLN: Freq: Once | INTRAVENOUS | Status: DC

## 2016-04-23 NOTE — Sedation Documentation (Signed)
Pt is extremely anxious, requesting medication to relax. Pt wants to sit up on table, states "I am freaking out, someone please help me."

## 2016-04-23 NOTE — Sedation Documentation (Signed)
Pt states she feels better sitting up

## 2016-04-23 NOTE — H&P (Signed)
Chief Complaint: Patient was seen in consultation today for cerebral arteriogram at the request of Dr Gaynelle Arabian  Referring Physician(s): Dr Gaynelle Arabian  Supervising Physician: Luanne Bras  Patient Status: Out-pt  History of Present Illness: Cynthia Frost is a 64 y.o. female   Known to [facility] Following R Middle Cerebral Artery stenosis since 05/2010 Fam Hx : cerebral aneurysms and CVA Had been referred for evaluation per PMD  Asymptomatic RMCA stenosis Most recent arteriogram 04/2014: IMPRESSION: Approximately 85-90% stenosis of the mid right M1 segment is stable.  Delayed hemodynamic flow in the right middle cerebral artery distribution distally, with retrograde leptomeningeal collateralization of the distal right MCA distribution from the anterior cerebral artery.  Mild FMD-like changes in the mid right ICA. Mild atherosclerotic narrowing of the left internal carotid artery cavernous segment.  Mild atherosclerotic narrowing of the right internal carotid artery proximal cavernous segment associated with a small focal aneurysm.  The angiograph findings were reviewed with patient. The patient remains asymptomatic on anti-platelets. Given the stable angiographic findings and patient being clinically stable also, it was decided to continue with the present medical management. The patient was again warned that should she developed left-sided symptoms, to call promptly.   Was seen in follow up 01/2016 for intermittent headaches Scheduled now for cerebral arteriogram for evaluation  Past Medical History  Diagnosis Date  . Hypertension   . Stenosis of right middle cerebral artery 2011  . Wears glasses   . Full dentures   . Diabetes mellitus without complication ([place])     no meds yet  . Arthritis     Past Surgical History  Procedure Laterality Date  . Colonoscopy    . Diagnostic laparoscopy      fibroid  . Cerebral angiogram    . Mass excision  Right 04/27/2014    Procedure: EXCISION MASS RIGHT RING FINGER DEBRIDEMENT PROXIMAL INTERPHALANGEAL JOINT RIGHT RING FINGER (PIP);  Surgeon: Wynonia Sours, MD;  Location: [place];  Service: Orthopedics;  Laterality: Right;    Allergies: Review of patient's allergies indicates no known allergies.  Medications: Prior to Admission medications   Medication Sig Start Date End Date Taking? Authorizing Provider  amLODipine (NORVASC) 5 MG tablet Take 5 mg by mouth daily.   Yes Historical Provider, MD  aspirin 81 MG tablet Take 81 mg by mouth daily.   Yes Historical Provider, MD  B Complex-Folic Acid (B COMPLEX-VITAMIN B12 PO) Take 1 mL by mouth daily.   Yes Historical Provider, MD  carvedilol (COREG) 25 MG tablet Take 25 mg by mouth daily.    Yes Historical Provider, MD  clopidogrel (PLAVIX) 75 MG tablet Take 75 mg by mouth daily.   Yes Historical Provider, MD  doxazosin (CARDURA) 8 MG tablet Take 8 mg by mouth daily.    Yes Historical Provider, MD  Glucosamine Sulfate (CVS GLUCOSAMINE SULFATE) 1000 MG CAPS Take 1,000 mg by mouth daily.   Yes Historical Provider, MD  HYDROcodone-acetaminophen (NORCO) 5-325 MG per tablet Take 1 tablet by mouth every 6 (six) hours as needed for moderate pain. 04/27/14  Yes Daryll Brod, MD  losartan-hydrochlorothiazide (HYZAAR) 100-25 MG per tablet Take 1 tablet by mouth daily.   Yes Historical Provider, MD  Turmeric Curcumin 500 MG CAPS Take 1,000 mg by mouth daily.    Yes Historical Provider, MD     Family History  Problem Relation Age of Onset  . Dementia Mother   . Hypertension Mother   . Cancer Father   .  Obesity Sister     Social History   Social History  . Marital Status: Married    Spouse Name: N/A  . Number of Children: N/A  . Years of Education: N/A   Social History Main Topics  . Smoking status: Never Smoker   . Smokeless tobacco: None  . Alcohol Use: No  . Drug Use: No  . Sexual Activity: Not Asked   Other Topics Concern    . None   Social History Narrative     Review of Systems: A 12 point ROS discussed and pertinent positives are indicated in the HPI above.  All other systems are negative.  Review of Systems  Constitutional: Negative for activity change and fatigue.  HENT: Negative for tinnitus.   Eyes: Negative for visual disturbance.  Respiratory: Negative for shortness of breath.   Gastrointestinal: Negative for abdominal pain.  Neurological: Negative for dizziness, tremors, seizures, syncope, facial asymmetry, speech difficulty, weakness, light-headedness, numbness and headaches.  Psychiatric/Behavioral: Negative for behavioral problems and confusion.    Vital Signs: BP 160/86 mmHg  Temp(Src) 98.1 F (36.7 C) (Oral)  Resp 18  Ht 5\' 5"  (1.651 m)  Wt 260 lb (117.935 kg)  BMI 43.27 kg/m2  SpO2 97%  Physical Exam  Constitutional: She is oriented to person, place, and time.  Cardiovascular: Normal rate, regular rhythm and normal heart sounds.   Pulmonary/Chest: Effort normal.  Abdominal: Soft. Bowel sounds are normal.  Musculoskeletal: Normal range of motion.  Neurological: She is alert and oriented to person, place, and time.  Skin: Skin is warm and dry.  Psychiatric: She has a normal mood and affect. Her behavior is normal. Judgment and thought content normal.  Nursing note and vitals reviewed.   Mallampati Score:  MD Evaluation Airway: WNL Heart: WNL Abdomen: WNL Chest/ Lungs: WNL ASA  Classification: 2 Mallampati/Airway Score: Two  Imaging: No results found.  Labs:  CBC:  Recent Labs  04/23/16 0730  WBC 10.0  HGB 13.4  HCT 40.2  PLT 297    COAGS:  Recent Labs  04/23/16 0730  INR 1.16  APTT 31    BMP:  Recent Labs  09/19/15 0745 04/23/16 0730  NA  --  139  K  --  5.4*  CL  --  102  CO2  --  28  GLUCOSE  --  143*  BUN  --  21*  CALCIUM  --  9.5  CREATININE 0.90 0.88  GFRNONAA  --  >60  GFRAA  --  >60    LIVER FUNCTION TESTS: No results  for input(s): BILITOT, AST, ALT, ALKPHOS, PROT, ALBUMIN in the last 8760 hours.  TUMOR MARKERS: No results for input(s): AFPTM, CEA, CA199, CHROMGRNA in the last 8760 hours.  Assessment and Plan:  R MCA stenosis Asymptomatic Scheduled for cerebral arteriogram in follow up Risks and Benefits discussed with the patient including, but not limited to bleeding, infection, vascular injury, contrast induced renal failure, stroke or even death. All of the patient's questions were answered, patient is agreeable to proceed. Consent signed and in chart.  Thank you for this interesting consult.  I greatly enjoyed meeting Naydelyn Kyer and look forward to participating in their care.  A copy of this report was sent to the requesting provider on this date.  Electronically Signed: Lavonia Eager A 04/23/2016, 8:18 AM   I spent a total of  30 Minutes   in face to face in clinical consultation, greater than 50% of which was counseling/coordinating care for cerebral  arteriogram

## 2016-04-23 NOTE — Sedation Documentation (Signed)
Pt states she is too anxious to continue with procedure. MD at bedside. Pt will reschedule.

## 2016-05-11 DIAGNOSIS — J180 Bronchopneumonia, unspecified organism: Secondary | ICD-10-CM | POA: Diagnosis not present

## 2016-05-11 DIAGNOSIS — I10 Essential (primary) hypertension: Secondary | ICD-10-CM | POA: Diagnosis not present

## 2016-05-30 ENCOUNTER — Telehealth (HOSPITAL_COMMUNITY): Payer: Self-pay

## 2016-05-30 NOTE — Telephone Encounter (Signed)
Called to reschedule angio, pt's husband stated that she was at work and would have pt call to reschedule. AW

## 2016-06-18 ENCOUNTER — Telehealth (HOSPITAL_COMMUNITY): Payer: Self-pay

## 2016-06-18 NOTE — Telephone Encounter (Signed)
Called to reschedule angiogram, no answer, no vm. AW

## 2016-07-04 DIAGNOSIS — M170 Bilateral primary osteoarthritis of knee: Secondary | ICD-10-CM | POA: Diagnosis not present

## 2016-07-04 DIAGNOSIS — E669 Obesity, unspecified: Secondary | ICD-10-CM | POA: Diagnosis not present

## 2016-07-18 ENCOUNTER — Telehealth (HOSPITAL_COMMUNITY): Payer: Self-pay

## 2016-07-18 NOTE — Telephone Encounter (Signed)
Called to reschedule angiogram, pt stated that she had the flu and would call back to reschedule. AW

## 2016-07-29 DIAGNOSIS — M170 Bilateral primary osteoarthritis of knee: Secondary | ICD-10-CM | POA: Diagnosis not present

## 2016-08-05 DIAGNOSIS — M170 Bilateral primary osteoarthritis of knee: Secondary | ICD-10-CM | POA: Diagnosis not present

## 2016-08-13 DIAGNOSIS — M170 Bilateral primary osteoarthritis of knee: Secondary | ICD-10-CM | POA: Diagnosis not present

## 2016-09-11 ENCOUNTER — Other Ambulatory Visit: Payer: Self-pay | Admitting: Physician Assistant

## 2016-09-11 ENCOUNTER — Ambulatory Visit
Admission: RE | Admit: 2016-09-11 | Discharge: 2016-09-11 | Disposition: A | Discharge status: Home / Self Care | Payer: BC Managed Care – PPO | Source: Ambulatory Visit | Attending: Physician Assistant | Admitting: Physician Assistant

## 2016-09-11 DIAGNOSIS — R062 Wheezing: Secondary | ICD-10-CM | POA: Diagnosis not present

## 2016-09-11 DIAGNOSIS — R0602 Shortness of breath: Secondary | ICD-10-CM

## 2016-09-11 DIAGNOSIS — I10 Essential (primary) hypertension: Secondary | ICD-10-CM | POA: Diagnosis not present

## 2016-09-11 DIAGNOSIS — R05 Cough: Secondary | ICD-10-CM | POA: Diagnosis not present

## 2016-09-11 DIAGNOSIS — I509 Heart failure, unspecified: Secondary | ICD-10-CM | POA: Diagnosis not present

## 2016-09-11 DIAGNOSIS — R635 Abnormal weight gain: Secondary | ICD-10-CM | POA: Diagnosis not present

## 2016-09-13 ENCOUNTER — Telehealth: Payer: Self-pay | Admitting: Cardiology

## 2016-09-13 NOTE — Telephone Encounter (Signed)
Received records from [facility] for appointment on 09/26/16 with Dr Stanford Breed.  Records given to [facility] (medical records) for Dr Jacalyn Lefevre schedule on 09/26/16. lp

## 2016-09-23 NOTE — Progress Notes (Signed)
HPI: 65 year old female for evaluation of dyspnea and atrial fibrillation. Laboratories June 2017 showed creatinine 0.79, TSH 0.84. Chest x-ray November 2017 showed CHF with mild interstitial edema and small pleural effusions. Patient states she was told 20 years ago that she may have had a heart attack. This was in [place]. No records available. She apparently moved here and was seen by cardiologist and told there is no history of myocardial infarction. Over the past year she notes dyspnea on exertion but there is no orthopnea, PND, palpitations or syncope. Occasional minimal pedal edema. She has occasional chest heaviness that is not related to exertion. Last several seconds and resolves. No radiation.   Current Outpatient Prescriptions  Medication Sig Dispense Refill  . albuterol (PROAIR HFA) 108 (90 Base) MCG/ACT inhaler Inhale 2 puffs into the lungs as directed.    Marland Kitchen amLODipine (NORVASC) 5 MG tablet Take 5 mg by mouth daily.    Marland Kitchen aspirin 81 MG tablet Take 81 mg by mouth daily.    . B Complex-Folic Acid (B COMPLEX-VITAMIN B12 PO) Take 1 mL by mouth daily.    . carvedilol (COREG) 25 MG tablet Take 25 mg by mouth daily.     . clopidogrel (PLAVIX) 75 MG tablet Take 75 mg by mouth daily.    Marland Kitchen doxazosin (CARDURA) 8 MG tablet Take 8 mg by mouth daily.     . DULERA 200-5 MCG/ACT AERO Inhale 2 puffs into the lungs as directed.    . Glucosamine Sulfate ([employer]S GLUCOSAMINE SULFATE) 1000 MG CAPS Take 1,000 mg by mouth daily.    Marland Kitchen HYDROcodone-acetaminophen (NORCO) 5-325 MG per tablet Take 1 tablet by mouth every 6 (six) hours as needed for moderate pain. 30 tablet 0  . losartan-hydrochlorothiazide (HYZAAR) 100-25 MG per tablet Take 1 tablet by mouth daily.    . Turmeric Curcumin 500 MG CAPS Take 1,000 mg by mouth daily.      No current facility-administered medications for this visit.     No Active Allergies   Past Medical History:  Diagnosis Date  . Arthritis   . Diabetes mellitus  without complication ([place])    no meds yet  . Full dentures   . Hypertension   . Hypertension   . Stenosis of right middle cerebral artery 2011  . Wears glasses     Past Surgical History:  Procedure Laterality Date  . CEREBRAL ANGIOGRAM    . COLONOSCOPY    . DIAGNOSTIC LAPAROSCOPY     fibroid  . MASS EXCISION Right 04/27/2014   Procedure: EXCISION MASS RIGHT RING FINGER DEBRIDEMENT PROXIMAL INTERPHALANGEAL JOINT RIGHT RING FINGER (PIP);  Surgeon: Wynonia Sours, MD;  Location: [facility];  Service: Orthopedics;  Laterality: Right;    Social History   Social History  . Marital status: Married    Spouse name: N/A  . Number of children: 2  . Years of education: N/A   Occupational History  . Not on file.   Social History Main Topics  . Smoking status: Never Smoker  . Smokeless tobacco: Not on file  . Alcohol use Yes     Comment: Occasional  . Drug use: No  . Sexual activity: Not on file   Other Topics Concern  . Not on file   Social History Narrative  . No narrative on file    Family History  Problem Relation Age of Onset  . Dementia Mother   . Hypertension Mother   . Cancer Father   .  Obesity Sister     [id number] pain but no fevers or chills, productive cough, hemoptysis, dysphasia, odynophagia, melena, hematochezia, dysuria, hematuria, rash, seizure activity, orthopnea, PND, pedal edema, claudication. Remaining systems are negative.  Physical Exam:   Blood pressure (!) 162/94, pulse 84, height 5\' 5"  (1.651 m), weight 276 lb 9.6 oz (125.5 kg).  General:  Well developed/obese in NAD Skin warm/dry Patient not depressed No peripheral clubbing Back-normal HEENT-normal/normal eyelids Neck supple/normal carotid upstroke bilaterally; no bruits; no JVD; no thyromegaly chest - CTA/ normal expansion CV - irregular/normal S1 and S2; no murmurs, rubs or gallops;  PMI nondisplaced Abdomen -NT/ND, no HSM, no mass, + bowel sounds, no bruit 2+ femoral  pulses, no bruits Ext-no edema, chords, 2+ DP Neuro-grossly nonfocal  ECG Atrial fibrillation at a rate of 84. Left axis deviation. Cannot rule out prior anterior infarct, nonspecific ST changes.  A/P  1 atrial fibrillation-duration unknown. Continue carvedilol for rate control. Embolic risk factors include female sex, age greater than 40, diabetes mellitus and hypertension. Discontinue aspirin. Begin apixaban 5 mg BID. Check hemoglobin and renal function in 4 weeks. I will also check a TSH at that time. Echocardiogram to assess LV function. I will see her back in 6 weeks and we will discuss cardioversion versus rate control at that time. Her atrial fibrillation could be contributing to her dyspnea. Therefore attempt at sinus may be worthwhile.  2 dyspnea-possibly secondary to atrial fibrillation. Plan as outlined above. I will also check an echocardiogram and nuclear study to further assess.  3 chest pain-symptoms atypical. Schedule Lexiscan nuclear study for risk stratification.  4 hypertension-blood pressure elevated. Increase amlodipine to 10 mg daily and follow.  5 history of stenosis of right middle cerebral artery-I am discontinuing aspirin given need for anticoagulation but we will continue with Plavix given this history. No records available.   Kirk Ruths, MD

## 2016-09-26 ENCOUNTER — Ambulatory Visit: Payer: BC Managed Care – PPO | Admitting: Cardiology

## 2016-09-30 ENCOUNTER — Ambulatory Visit (INDEPENDENT_AMBULATORY_CARE_PROVIDER_SITE_OTHER): Payer: BC Managed Care – PPO | Admitting: Cardiology

## 2016-09-30 ENCOUNTER — Encounter: Payer: Self-pay | Admitting: Cardiology

## 2016-09-30 VITALS — BP 162/94 | HR 84 | Ht 65.0 in | Wt 276.6 lb

## 2016-09-30 DIAGNOSIS — I4891 Unspecified atrial fibrillation: Secondary | ICD-10-CM | POA: Diagnosis not present

## 2016-09-30 DIAGNOSIS — R072 Precordial pain: Secondary | ICD-10-CM

## 2016-09-30 DIAGNOSIS — I10 Essential (primary) hypertension: Secondary | ICD-10-CM | POA: Diagnosis not present

## 2016-09-30 MED ORDER — AMLODIPINE BESYLATE 10 MG PO TABS: 10.0000 mg | ORAL_TABLET | Freq: Every day | ORAL | 3 refills | Status: DC

## 2016-09-30 MED ORDER — APIXABAN 5 MG PO TABS: 5.0000 mg | ORAL_TABLET | Freq: Two times a day (BID) | ORAL | 6 refills | Status: DC

## 2016-09-30 NOTE — Patient Instructions (Signed)
Medication Instructions:   STOP ASPIRIN  START ELIQUIS 5 MG ONE TABLET TWICE DAILY  INCREASE AMLODIPINE TO 10 MG ONCE DAILY= 2 OF THE 5 MG TABLETS ONCE DAILY  Labwork:  Your physician recommends that you return for lab work in: 4 WEEKS  Testing/Procedures:  Your physician has requested that you have a lexiscan myoview. For further information please visit [email or web address]. Please follow instruction sheet, as given.   Your physician has requested that you have an echocardiogram. Echocardiography is a painless test that uses sound waves to create images of your heart. It provides your doctor with information about the size and shape of your heart and how well your heart's chambers and valves are working. This procedure takes approximately one hour. There are no restrictions for this procedure.    Follow-Up:  Your physician recommends that you schedule a follow-up appointment in: [place]

## 2016-10-09 ENCOUNTER — Telehealth: Payer: Self-pay | Admitting: Cardiology

## 2016-10-09 MED ORDER — DIAZEPAM 5 MG PO TABS: ORAL_TABLET | ORAL | 0 refills | Status: DC

## 2016-10-09 NOTE — Telephone Encounter (Signed)
Valium 5 mg prior to study; will need someone to drive her that day (no driving on valium). Kirk Ruths

## 2016-10-09 NOTE — Telephone Encounter (Signed)
Spoke with pt states that she went in today and saw the [place] room and she "freaked out" and will need medication for upcoming [place]. She states that in 2012 she was given something for her to take before her CT scan and this did not work so she states that she will have to have something stronger. Please advise.

## 2016-10-09 NOTE — Telephone Encounter (Signed)
Spoke with pt, Aware of dr Jacalyn Lefevre recommendations.  New script phoned to the pharmacy

## 2016-10-09 NOTE — Telephone Encounter (Signed)
New message  Pt call requesting to speak with RN about the stress test she will be having.pleasse call back to discuss

## 2016-10-17 ENCOUNTER — Telehealth (HOSPITAL_COMMUNITY): Payer: Self-pay

## 2016-10-17 NOTE — Telephone Encounter (Signed)
Encounter complete. 

## 2016-10-18 DIAGNOSIS — M170 Bilateral primary osteoarthritis of knee: Secondary | ICD-10-CM | POA: Diagnosis not present

## 2016-10-22 ENCOUNTER — Ambulatory Visit (HOSPITAL_COMMUNITY)
Admission: RE | Admit: 2016-10-22 | Discharge: 2016-10-22 | Disposition: A | Discharge status: Home / Self Care | Payer: BC Managed Care – PPO | Source: Ambulatory Visit | Attending: Internal Medicine | Admitting: Internal Medicine

## 2016-10-22 DIAGNOSIS — Z6841 Body Mass Index (BMI) 40.0 and over, adult: Secondary | ICD-10-CM | POA: Insufficient documentation

## 2016-10-22 DIAGNOSIS — R42 Dizziness and giddiness: Secondary | ICD-10-CM | POA: Diagnosis not present

## 2016-10-22 DIAGNOSIS — R0602 Shortness of breath: Secondary | ICD-10-CM | POA: Insufficient documentation

## 2016-10-22 DIAGNOSIS — E119 Type 2 diabetes mellitus without complications: Secondary | ICD-10-CM | POA: Insufficient documentation

## 2016-10-22 DIAGNOSIS — E669 Obesity, unspecified: Secondary | ICD-10-CM | POA: Diagnosis not present

## 2016-10-22 DIAGNOSIS — I4891 Unspecified atrial fibrillation: Secondary | ICD-10-CM | POA: Insufficient documentation

## 2016-10-22 DIAGNOSIS — I10 Essential (primary) hypertension: Secondary | ICD-10-CM | POA: Diagnosis not present

## 2016-10-22 DIAGNOSIS — R072 Precordial pain: Secondary | ICD-10-CM | POA: Diagnosis not present

## 2016-10-22 MED ORDER — TECHNETIUM TC 99M TETROFOSMIN IV KIT
30.2000 | PACK | Freq: Once | INTRAVENOUS | Status: AC | PRN
  Administered 2016-10-22: 30.2 via INTRAVENOUS
  Filled 2016-10-22: qty 31

## 2016-10-22 MED ORDER — AMINOPHYLLINE 25 MG/ML IV SOLN
100.0000 mg | Freq: Once | INTRAVENOUS | Status: AC
  Administered 2016-10-22: 100 mg via INTRAVENOUS

## 2016-10-22 MED ORDER — REGADENOSON 0.4 MG/5ML IV SOLN
0.4000 mg | Freq: Once | INTRAVENOUS | Status: AC
  Administered 2016-10-22: 0.4 mg via INTRAVENOUS

## 2016-10-23 ENCOUNTER — Ambulatory Visit (HOSPITAL_COMMUNITY)
Admission: RE | Admit: 2016-10-23 | Discharge: 2016-10-23 | Disposition: A | Discharge status: Home / Self Care | Payer: BC Managed Care – PPO | Source: Ambulatory Visit | Attending: Cardiovascular Disease | Admitting: Cardiovascular Disease

## 2016-10-23 LAB — MYOCARDIAL PERFUSION IMAGING
CHL CUP NUCLEAR SDS: 0
CHL CUP NUCLEAR SRS: 0
CHL CUP NUCLEAR SSS: 0
CHL CUP RESTING HR STRESS: 78 {beats}/min
LV sys vol: 47 mL
LVDIAVOL: 96 mL (ref 46–106)
NUC STRESS TID: 0.96
Peak HR: 96 {beats}/min

## 2016-10-23 MED ORDER — TECHNETIUM TC 99M TETROFOSMIN IV KIT
31.0000 | PACK | Freq: Once | INTRAVENOUS | Status: AC | PRN
  Administered 2016-10-23: 31 via INTRAVENOUS

## 2016-10-24 ENCOUNTER — Ambulatory Visit (HOSPITAL_COMMUNITY): Payer: BC Managed Care – PPO | Attending: Cardiology

## 2016-10-24 ENCOUNTER — Other Ambulatory Visit: Payer: Self-pay

## 2016-10-24 DIAGNOSIS — I509 Heart failure, unspecified: Secondary | ICD-10-CM | POA: Insufficient documentation

## 2016-10-24 DIAGNOSIS — I110 Hypertensive heart disease with heart failure: Secondary | ICD-10-CM | POA: Insufficient documentation

## 2016-10-24 DIAGNOSIS — E119 Type 2 diabetes mellitus without complications: Secondary | ICD-10-CM | POA: Insufficient documentation

## 2016-10-24 DIAGNOSIS — I4891 Unspecified atrial fibrillation: Secondary | ICD-10-CM | POA: Insufficient documentation

## 2016-10-25 DIAGNOSIS — M170 Bilateral primary osteoarthritis of knee: Secondary | ICD-10-CM | POA: Diagnosis not present

## 2016-10-25 LAB — CBC
HEMATOCRIT: 41.4 % (ref 35.0–45.0)
Hemoglobin: 13.5 g/dL (ref 11.7–15.5)
MCH: 29.4 pg (ref 27.0–33.0)
MCHC: 32.6 g/dL (ref 32.0–36.0)
MCV: 90.2 fL (ref 80.0–100.0)
MPV: 9.9 fL (ref 7.5–12.5)
Platelets: 238 10*3/uL (ref 140–400)
RBC: 4.59 MIL/uL (ref 3.80–5.10)
RDW: 14.4 % (ref 11.0–15.0)
WBC: 8.2 10*3/uL (ref 3.8–10.8)

## 2016-10-25 LAB — BASIC METABOLIC PANEL
BUN: 15 mg/dL (ref 7–25)
CALCIUM: 9 mg/dL (ref 8.6–10.4)
CHLORIDE: 103 mmol/L (ref 98–110)
CO2: 29 mmol/L (ref 20–31)
CREATININE: 0.76 mg/dL (ref 0.50–0.99)
GLUCOSE: 101 mg/dL — AB (ref 65–99)
Potassium: 3.7 mmol/L (ref 3.5–5.3)
Sodium: 140 mmol/L (ref 135–146)

## 2016-10-25 LAB — TSH: TSH: 0.8 mIU/L

## 2016-10-31 DIAGNOSIS — M170 Bilateral primary osteoarthritis of knee: Secondary | ICD-10-CM | POA: Diagnosis not present

## 2016-11-11 NOTE — Progress Notes (Deleted)
HPI: FU dyspnea and atrial fibrillation. Patient states she was told 20 years ago that she may have had a heart attack. This was in [place]. No records available. She apparently moved here and was seen by cardiologist and told there is no history of myocardial infarction. Echo 12/17 showed normal LV function and mild LAE. Nuclear study 12/17 showed EF 51 and normal perfusion. TSH 12/17 0.8. Since last seen,   Current Outpatient Prescriptions  Medication Sig Dispense Refill  . albuterol (PROAIR HFA) 108 (90 Base) MCG/ACT inhaler Inhale 2 puffs into the lungs as directed.    Marland Kitchen amLODipine (NORVASC) 10 MG tablet Take 1 tablet (10 mg total) by mouth daily. 90 tablet 3  . apixaban (ELIQUIS) 5 MG TABS tablet Take 1 tablet (5 mg total) by mouth 2 (two) times daily. 60 tablet 6  . B Complex-Folic Acid (B COMPLEX-VITAMIN B12 PO) Take 1 mL by mouth daily.    . carvedilol (COREG) 25 MG tablet Take 25 mg by mouth daily.     . clopidogrel (PLAVIX) 75 MG tablet Take 75 mg by mouth daily.    . diazepam (VALIUM) 5 MG tablet Take 1 tablet 30 minutes prior to testing 2 tablet 0  . doxazosin (CARDURA) 8 MG tablet Take 8 mg by mouth daily.     . DULERA 200-5 MCG/ACT AERO Inhale 2 puffs into the lungs as directed.    . Glucosamine Sulfate (CVS GLUCOSAMINE SULFATE) 1000 MG CAPS Take 1,000 mg by mouth daily.    Marland Kitchen HYDROcodone-acetaminophen (NORCO) 5-325 MG per tablet Take 1 tablet by mouth every 6 (six) hours as needed for moderate pain. 30 tablet 0  . losartan-hydrochlorothiazide (HYZAAR) 100-25 MG per tablet Take 1 tablet by mouth daily.    . Turmeric Curcumin 500 MG CAPS Take 1,000 mg by mouth daily.      No current facility-administered medications for this visit.      Past Medical History:  Diagnosis Date  . Arthritis   . Diabetes mellitus without complication ([place])    no meds yet  . Full dentures   . Hypertension   . Hypertension   . Stenosis of right middle cerebral artery 2011  . Wears  glasses     Past Surgical History:  Procedure Laterality Date  . CEREBRAL ANGIOGRAM    . COLONOSCOPY    . DIAGNOSTIC LAPAROSCOPY     fibroid  . MASS EXCISION Right 04/27/2014   Procedure: EXCISION MASS RIGHT RING FINGER DEBRIDEMENT PROXIMAL INTERPHALANGEAL JOINT RIGHT RING FINGER (PIP);  Surgeon: Wynonia Sours, MD;  Location: [facility];  Service: Orthopedics;  Laterality: Right;    Social History   Social History  . Marital status: Married    Spouse name: N/A  . Number of children: 2  . Years of education: N/A   Occupational History  . Not on file.   Social History Main Topics  . Smoking status: Never Smoker  . Smokeless tobacco: Not on file  . Alcohol use Yes     Comment: Occasional  . Drug use: No  . Sexual activity: Not on file   Other Topics Concern  . Not on file   Social History Narrative  . No narrative on file    Family History  Problem Relation Age of Onset  . Dementia Mother   . Hypertension Mother   . Cancer Father   . Obesity Sister     ROS: no fevers or chills,  productive cough, hemoptysis, dysphasia, odynophagia, melena, hematochezia, dysuria, hematuria, rash, seizure activity, orthopnea, PND, pedal edema, claudication. Remaining systems are negative.  Physical Exam: Well-developed well-nourished in no acute distress.  Skin is warm and dry.  HEENT is normal.  Neck is supple.  Chest is clear to auscultation with normal expansion.  Cardiovascular exam is regular rate and rhythm.  Abdominal exam nontender or distended. No masses palpated. Extremities show no edema. neuro grossly intact  ECG

## 2016-11-18 ENCOUNTER — Ambulatory Visit: Payer: BC Managed Care – PPO | Admitting: Cardiology

## 2016-11-18 ENCOUNTER — Encounter: Payer: Self-pay | Admitting: Cardiology

## 2016-11-18 ENCOUNTER — Ambulatory Visit (INDEPENDENT_AMBULATORY_CARE_PROVIDER_SITE_OTHER): Payer: Medicare Other | Admitting: Cardiology

## 2016-11-18 VITALS — BP 142/80 | HR 78 | Ht 65.0 in | Wt 274.0 lb

## 2016-11-18 DIAGNOSIS — I10 Essential (primary) hypertension: Secondary | ICD-10-CM | POA: Diagnosis not present

## 2016-11-18 DIAGNOSIS — I4891 Unspecified atrial fibrillation: Secondary | ICD-10-CM | POA: Diagnosis not present

## 2016-11-18 NOTE — Progress Notes (Signed)
HPI: FU atrial fibrillation and dyspnea. Chest x-ray November 2017 showed CHF with mild interstitial edema and small pleural effusions. Patient states she was told 20 years ago that she may have had a heart attack. This was in [place]. No records available. Echocardiogram December 2017 showed normal LV systolic function, mild left atrial enlargement. Nuclear study December 2017 showed ejection fraction 51% and no ischemia or infarction. Since last seen patient occasionally has mild dyspnea but no chest pain, palpitations, syncope, pedal edema or bleeding.  Current Outpatient Prescriptions  Medication Sig Dispense Refill  . albuterol (PROAIR HFA) 108 (90 Base) MCG/ACT inhaler Inhale 2 puffs into the lungs as directed.    Marland Kitchen amLODipine (NORVASC) 10 MG tablet Take 1 tablet (10 mg total) by mouth daily. 90 tablet 3  . apixaban (ELIQUIS) 5 MG TABS tablet Take 1 tablet (5 mg total) by mouth 2 (two) times daily. 60 tablet 6  . carvedilol (COREG) 25 MG tablet Take 25 mg by mouth daily.     . clopidogrel (PLAVIX) 75 MG tablet Take 75 mg by mouth daily.    Marland Kitchen doxazosin (CARDURA) 8 MG tablet Take 8 mg by mouth daily.     . DULERA 200-5 MCG/ACT AERO Inhale 2 puffs into the lungs as directed.    Marland Kitchen FLUZONE HIGH-DOSE 0.5 ML SUSY TO BE ADMINISTERED BY PHARMACIST FOR IMMUNIZATION  0  . losartan-hydrochlorothiazide (HYZAAR) 100-25 MG per tablet Take 1 tablet by mouth daily.    . Turmeric Curcumin 500 MG CAPS Take 1,000 mg by mouth daily.      No current facility-administered medications for this visit.      Past Medical History:  Diagnosis Date  . Arthritis   . Diabetes mellitus without complication ([place])    no meds yet  . Full dentures   . Hypertension   . Hypertension   . Stenosis of right middle cerebral artery 2011  . Wears glasses     Past Surgical History:  Procedure Laterality Date  . CEREBRAL ANGIOGRAM    . COLONOSCOPY    . DIAGNOSTIC LAPAROSCOPY     fibroid  . MASS EXCISION  Right 04/27/2014   Procedure: EXCISION MASS RIGHT RING FINGER DEBRIDEMENT PROXIMAL INTERPHALANGEAL JOINT RIGHT RING FINGER (PIP);  Surgeon: Wynonia Sours, MD;  Location: [facility];  Service: Orthopedics;  Laterality: Right;    Social History   Social History  . Marital status: Married    Spouse name: N/A  . Number of children: 2  . Years of education: N/A   Occupational History  . Not on file.   Social History Main Topics  . Smoking status: Never Smoker  . Smokeless tobacco: Not on file  . Alcohol use Yes     Comment: Occasional  . Drug use: No  . Sexual activity: Not on file   Other Topics Concern  . Not on file   Social History Narrative  . No narrative on file    Family History  Problem Relation Age of Onset  . Dementia Mother   . Hypertension Mother   . Cancer Father   . Obesity Sister     ROS: no fevers or chills, productive cough, hemoptysis, dysphasia, odynophagia, melena, hematochezia, dysuria, hematuria, rash, seizure activity, orthopnea, PND, pedal edema, claudication. Remaining systems are negative.  Physical Exam: Well-developed obese in no acute distress.  Skin is warm and dry.  HEENT is normal.  Neck is supple.  Chest is clear  to auscultation with normal expansion.  Cardiovascular exam is irregular Abdominal exam nontender or distended. No masses palpated. Extremities show no edema. neuro grossly intact  A/P  1 persistent atrial fibrillation-duration is unknown. Continue carvedilol for rate control.CHADSvasc 4. Continue apixaban. Echocardiogram shows normal LV function. We discussed rate control versus rhythm control today. She has occasional dyspnea but attributes this to congestion. She would prefer rate control at this point. We will see her back in 3 months to make sure that she remains asymptomatic. We could consider cardioversion at that time as needed. I also explained the longer she remains in atrial fibrillation though more  difficult to reestablish sinus rhythm.  2 Hypertension-blood pressure controlled. Continue present medications.  3 chest pain-no further symptoms. Nuclear study showed no ischemia or infarction.  4 history of right middle cerebral artery stenosis. We will continue with Plavix but aspirin was discontinued because of need for apixaban.  Kirk Ruths, MD

## 2016-11-18 NOTE — Patient Instructions (Signed)
NO CHANGES WITH CURRENT MEDICATIONS    Your physician recommends that you schedule a follow-up appointment in3 MONTHS WITH DR Stanford Breed      If you need a refill on your cardiac medications before your next appointment, please call your pharmacy.

## 2016-12-12 ENCOUNTER — Ambulatory Visit: Payer: BC Managed Care – PPO | Admitting: Cardiology

## 2017-01-13 DIAGNOSIS — I679 Cerebrovascular disease, unspecified: Secondary | ICD-10-CM | POA: Diagnosis not present

## 2017-01-13 DIAGNOSIS — M1990 Unspecified osteoarthritis, unspecified site: Secondary | ICD-10-CM | POA: Diagnosis not present

## 2017-01-13 DIAGNOSIS — E1139 Type 2 diabetes mellitus with other diabetic ophthalmic complication: Secondary | ICD-10-CM | POA: Diagnosis not present

## 2017-01-13 DIAGNOSIS — Z23 Encounter for immunization: Secondary | ICD-10-CM | POA: Diagnosis not present

## 2017-01-13 DIAGNOSIS — J309 Allergic rhinitis, unspecified: Secondary | ICD-10-CM | POA: Diagnosis not present

## 2017-01-13 DIAGNOSIS — R809 Proteinuria, unspecified: Secondary | ICD-10-CM | POA: Diagnosis not present

## 2017-01-13 DIAGNOSIS — I481 Persistent atrial fibrillation: Secondary | ICD-10-CM | POA: Diagnosis not present

## 2017-01-13 DIAGNOSIS — E041 Nontoxic single thyroid nodule: Secondary | ICD-10-CM | POA: Diagnosis not present

## 2017-01-13 DIAGNOSIS — I10 Essential (primary) hypertension: Secondary | ICD-10-CM | POA: Diagnosis not present

## 2017-01-13 DIAGNOSIS — E1129 Type 2 diabetes mellitus with other diabetic kidney complication: Secondary | ICD-10-CM | POA: Diagnosis not present

## 2017-02-05 DIAGNOSIS — Z803 Family history of malignant neoplasm of breast: Secondary | ICD-10-CM | POA: Diagnosis not present

## 2017-02-05 DIAGNOSIS — Z1231 Encounter for screening mammogram for malignant neoplasm of breast: Secondary | ICD-10-CM | POA: Diagnosis not present

## 2017-02-12 DIAGNOSIS — F419 Anxiety disorder, unspecified: Secondary | ICD-10-CM | POA: Diagnosis not present

## 2017-02-17 ENCOUNTER — Other Ambulatory Visit: Payer: Self-pay

## 2017-02-17 DIAGNOSIS — I4891 Unspecified atrial fibrillation: Secondary | ICD-10-CM

## 2017-02-17 MED ORDER — AMLODIPINE BESYLATE 10 MG PO TABS: 10.0000 mg | ORAL_TABLET | Freq: Every day | ORAL | 1 refills | Status: DC

## 2017-02-17 MED ORDER — APIXABAN 5 MG PO TABS: 5.0000 mg | ORAL_TABLET | Freq: Two times a day (BID) | ORAL | 1 refills | Status: DC

## 2017-02-18 ENCOUNTER — Ambulatory Visit: Payer: Medicare Other | Admitting: Cardiology

## 2017-02-19 ENCOUNTER — Ambulatory Visit (INDEPENDENT_AMBULATORY_CARE_PROVIDER_SITE_OTHER): Payer: Medicare Other | Admitting: Physician Assistant

## 2017-02-19 ENCOUNTER — Encounter: Payer: Self-pay | Admitting: Physician Assistant

## 2017-02-19 VITALS — BP 137/83 | HR 73 | Ht 65.0 in | Wt 264.4 lb

## 2017-02-19 DIAGNOSIS — I481 Persistent atrial fibrillation: Secondary | ICD-10-CM | POA: Diagnosis not present

## 2017-02-19 DIAGNOSIS — E119 Type 2 diabetes mellitus without complications: Secondary | ICD-10-CM | POA: Diagnosis not present

## 2017-02-19 DIAGNOSIS — I10 Essential (primary) hypertension: Secondary | ICD-10-CM

## 2017-02-19 DIAGNOSIS — Z8673 Personal history of transient ischemic attack (TIA), and cerebral infarction without residual deficits: Secondary | ICD-10-CM | POA: Diagnosis not present

## 2017-02-19 DIAGNOSIS — I4819 Other persistent atrial fibrillation: Secondary | ICD-10-CM

## 2017-02-19 MED ORDER — CARVEDILOL 25 MG PO TABS: 12.5000 mg | ORAL_TABLET | Freq: Every day | ORAL | 2 refills | Status: AC

## 2017-02-19 NOTE — Patient Instructions (Addendum)
Medication Instructions:  DECREASE Coreg (Carvedilol) to 12.5mg  twice a day (cut Metoprolol 25mg  in half)  Labwork: None   Testing/Procedures: None   Follow-Up: Your physician recommends that you schedule a follow-up appointment in: 3 Months Dr Stanford Breed   Any Other Special Instructions Will Be Listed Below (If Applicable).  If you need a refill on your cardiac medications before your next appointment, please call your pharmacy.

## 2017-02-19 NOTE — Progress Notes (Signed)
Cardiology Office Note    Date:  02/20/2017   ID:  Cynthia Frost, DOB [birth year], MRN [id number]  PCP:  Simona Huh, MD  Cardiologist:  Dr. Stanford Breed  Chief Complaint  Patient presents with  . Follow-up    seen for Dr. Stanford Breed    History of Present Illness:  Cynthia Frost is a 66 y.o. female with PMH of HTN, DM II, h/o R MCA stroke and persistent atrial fibrillation. Chest x-ray obtained in November 2017 showed CHF with mild interstitial edema and a small pleural effusion. According to the patient, she may have had a heart attack 20 years ago. This was in [place], therefore no record of was available. Echocardiogram obtained in December 2017 showing normal LV function, mild left atrial enlargement. Myoview obtained in December 2017 showed EF 51%, no ischemia or infarction. She was seen in January 2018, at which time she was complaining of occasional mild dyspnea but no chest pain, palpitation or syncope. She was in persistent atrial fibrillation at the time, with unknown duration. She is being treated with eliquis. Dr. Stanford Breed did discuss with the patient regarding rate control versus versus rhythm control, she opted for rate control at this time which is reasonable as she is asymptomatic. She is also on Lasix given history of right MCA stroke, however she is no longer on aspirin.  She is doing well, her heart rate is well-controlled on current medication. For some reason she is on carvedilol 25 mg daily instead of 12.5 mg twice a day. I will spread into 12.5 mg twice a day for now. Otherwise she has not had any issues recently. She denies any significant chest discomfort or shortness of breath. If her blood pressure is stable, we could potentially consider increase the carvedilol to 25 mg twice a day. We did discuss potential cardioversion, she wished to hold off on this for now unless she does not have any cardiac awareness.    Past Medical History:  Diagnosis Date  .  Arthritis   . Diabetes mellitus without complication ([place])    no meds yet  . Full dentures   . Hypertension   . Hypertension   . Stenosis of right middle cerebral artery 2011  . Wears glasses     Past Surgical History:  Procedure Laterality Date  . CEREBRAL ANGIOGRAM    . COLONOSCOPY    . DIAGNOSTIC LAPAROSCOPY     fibroid  . MASS EXCISION Right 04/27/2014   Procedure: EXCISION MASS RIGHT RING FINGER DEBRIDEMENT PROXIMAL INTERPHALANGEAL JOINT RIGHT RING FINGER (PIP);  Surgeon: Wynonia Sours, MD;  Location: [facility];  Service: Orthopedics;  Laterality: Right;    Current Medications: Outpatient Medications Prior to Visit  Medication Sig Dispense Refill  . albuterol (PROAIR HFA) 108 (90 Base) MCG/ACT inhaler Inhale 2 puffs into the lungs as directed.    Marland Kitchen amLODipine (NORVASC) 10 MG tablet Take 1 tablet (10 mg total) by mouth daily. 90 tablet 1  . apixaban (ELIQUIS) 5 MG TABS tablet Take 1 tablet (5 mg total) by mouth 2 (two) times daily. 180 tablet 1  . clopidogrel (PLAVIX) 75 MG tablet Take 75 mg by mouth daily.    Marland Kitchen doxazosin (CARDURA) 8 MG tablet Take 8 mg by mouth daily.     . DULERA 200-5 MCG/ACT AERO Inhale 2 puffs into the lungs as directed.    Marland Kitchen FLUZONE HIGH-DOSE 0.5 ML SUSY TO BE ADMINISTERED BY PHARMACIST FOR IMMUNIZATION  0  . losartan-hydrochlorothiazide (  HYZAAR) 100-25 MG per tablet Take 1 tablet by mouth daily.    . Turmeric Curcumin 500 MG CAPS Take 1,000 mg by mouth daily.     . carvedilol (COREG) 25 MG tablet Take 25 mg by mouth daily.      No facility-administered medications prior to visit.      Allergies:   Patient has no known allergies.   Social History   Social History  . Marital status: Married    Spouse name: N/A  . Number of children: 2  . Years of education: N/A   Social History Main Topics  . Smoking status: Never Smoker  . Smokeless tobacco: Never Used  . Alcohol use Yes     Comment: Occasional  . Drug use: No  . Sexual  activity: Not Asked   Other Topics Concern  . None   Social History Narrative  . None     Family History:  The patient's family history includes Cancer in her father; Dementia in her mother; Hypertension in her mother; Obesity in her sister.   ROS:   Please see the history of present illness.    ROS All other systems reviewed and are negative.   PHYSICAL EXAM:   VS:  BP 137/83   Pulse 73   Ht 5\' 5"  (1.651 m)   Wt 264 lb 6.4 oz (119.9 kg)   SpO2 99%   BMI 44.00 kg/m    GEN: Well nourished, well developed, in no acute distress  HEENT: normal  Neck: no JVD, carotid bruits, or masses Cardiac: RRR; no murmurs, rubs, or gallops,no edema  Respiratory:  clear to auscultation bilaterally, normal work of breathing GI: soft, nontender, nondistended, + BS MS: no deformity or atrophy  Skin: warm and dry, no rash Neuro:  Alert and Oriented x 3, Strength and sensation are intact Psych: euthymic mood, full affect  Wt Readings from Last 3 Encounters:  02/19/17 264 lb 6.4 oz (119.9 kg)  11/18/16 274 lb (124.3 kg)  10/22/16 276 lb (125.2 kg)      Studies/Labs Reviewed:   EKG:  EKG is not ordered today. Afib based on physical exam  Recent Labs: 10/24/2016: BUN 15; Creat 0.76; Hemoglobin 13.5; Platelets 238; Potassium 3.7; Sodium 140; TSH 0.80   Lipid Panel No results found for: CHOL, TRIG, HDL, CHOLHDL, VLDL, LDLCALC, LDLDIRECT  Additional studies/ records that were reviewed today include:    Myoview 10/23/2016 Study Highlights     The left ventricular ejection fraction is mildly decreased (45-54%).  Nuclear stress EF: 51%.  There was no ST segment deviation noted during stress.  The study is normal.  This is a low risk study.   Normal resting and stress perfusion. No ischemia or infarction EF EF 51% but calculated with patient in afib      Echo 10/24/2016 LV EF: 55% -   60%  - Left ventricle: The cavity size was normal. There was mild   concentric  hypertrophy. Systolic function was normal. The   estimated ejection fraction was in the range of 55% to 60%. Wall   motion was normal; there were no regional wall motion   abnormalities. The study was not technically sufficient to allow   evaluation of LV diastolic dysfunction due to atrial   fibrillation. - Aortic valve: Trileaflet; normal thickness leaflets. There was no   regurgitation. - Aortic root: The aortic root was normal in size. - Ascending aorta: The ascending aorta was normal in size. - Mitral valve: Structurally  normal valve. There was trivial   regurgitation. - Left atrium: The atrium was mildly dilated. - Right ventricle: Systolic function was normal. - Right atrium: The atrium was normal in size. - Tricuspid valve: There was no regurgitation. - Pulmonary arteries: Systolic pressure could not be accurately   estimated. - Inferior vena cava: The vessel was normal in size. - Pericardium, extracardiac: There was no pericardial effusion     ASSESSMENT:    1. Persistent atrial fibrillation ([place])   2. Essential hypertension   3. Controlled type 2 diabetes mellitus without complication, without long-term current use of insulin ([place])   4. H/O: CVA (cerebrovascular accident)      PLAN:  In order of problems listed above:  1. Persistent atrial fibrillation: On eliquis, for some reason she is taking 25 mg daily of carvedilol. Her heart rate is well controlled on current dose, however I recommended 12.5 mg twice a day of carvedilol. If her blood pressure is able to tolerate it, we can increase it to 25 mg twice a day dosing later.  2. HTN: She is currently on amlodipine 10 mg daily, losartan/HCTZ 100 mg-25 mg. For some reason she is on carvedilol 25 mg daily instead of 12.5 mg twice a day, I have changed it to BID dosing  3. DM II: Despite diabetes listed as the past medical history, however she is not on any diabetic medication. Will defer to primary care  physician  4. h/o CVA: on plavix    Medication Adjustments/Labs and Tests Ordered: Current medicines are reviewed at length with the patient today.  Concerns regarding medicines are outlined above.  Medication changes, Labs and Tests ordered today are listed in the Patient Instructions below. Patient Instructions  Medication Instructions:  DECREASE Coreg (Carvedilol) to 12.5mg  twice a day (cut Metoprolol 25mg  in half)  Labwork: None   Testing/Procedures: None   Follow-Up: Your physician recommends that you schedule a follow-up appointment in: 3 Months Dr Stanford Breed   Any Other Special Instructions Will Be Listed Below (If Applicable).  If you need a refill on your cardiac medications before your next appointment, please call your pharmacy.     Hilbert Corrigan, [place]  02/20/2017 5:53 AM    [address], [place], [place]  [address] Phone: [phone]; Fax: [phone]

## 2017-02-20 ENCOUNTER — Encounter: Payer: Self-pay | Admitting: Physician Assistant

## 2017-02-25 ENCOUNTER — Other Ambulatory Visit: Payer: Self-pay | Admitting: Cardiology

## 2017-02-25 DIAGNOSIS — I4891 Unspecified atrial fibrillation: Secondary | ICD-10-CM

## 2017-02-25 MED ORDER — APIXABAN 5 MG PO TABS: 5.0000 mg | ORAL_TABLET | Freq: Two times a day (BID) | ORAL | 1 refills | Status: DC

## 2017-02-25 NOTE — Telephone Encounter (Signed)
New message     *STAT* If patient is at the pharmacy, call can be transferred to refill team.   1. Which medications need to be refilled? (please list name of each medication and dose if known) apixaban (ELIQUIS) 5 MG TABS tablet  2. Which pharmacy/location (including street and city if local pharmacy) is medication to be sent to? [facility] [phone]   3. Do they need a 30 day or 90 day supply? 90 day supply

## 2017-04-08 DIAGNOSIS — F410 Panic disorder [episodic paroxysmal anxiety] without agoraphobia: Secondary | ICD-10-CM | POA: Diagnosis not present

## 2017-04-09 DIAGNOSIS — F320 Major depressive disorder, single episode, mild: Secondary | ICD-10-CM | POA: Diagnosis not present

## 2017-04-09 DIAGNOSIS — F410 Panic disorder [episodic paroxysmal anxiety] without agoraphobia: Secondary | ICD-10-CM | POA: Diagnosis not present

## 2017-04-22 DIAGNOSIS — F411 Generalized anxiety disorder: Secondary | ICD-10-CM | POA: Diagnosis not present

## 2017-04-22 DIAGNOSIS — F410 Panic disorder [episodic paroxysmal anxiety] without agoraphobia: Secondary | ICD-10-CM | POA: Diagnosis not present

## 2017-04-24 DIAGNOSIS — M170 Bilateral primary osteoarthritis of knee: Secondary | ICD-10-CM | POA: Diagnosis not present

## 2017-04-29 DIAGNOSIS — F410 Panic disorder [episodic paroxysmal anxiety] without agoraphobia: Secondary | ICD-10-CM | POA: Diagnosis not present

## 2017-04-29 DIAGNOSIS — F320 Major depressive disorder, single episode, mild: Secondary | ICD-10-CM | POA: Diagnosis not present

## 2017-05-20 NOTE — Progress Notes (Signed)
HPI: FU atrial fibrillation and dyspnea. Chest x-ray November 2017 showed CHF with mild interstitial edema and small pleural effusions.Patient states she was told 20 years ago that she may have had a heart attack. This was in [place]. No records available. Echocardiogram December 2017 showed normal LV systolic function, mild left atrial enlargement. Nuclear study December 2017 showed ejection fraction 51% and no ischemia or infarction. At last ov, pt noted to be in atrial fibrillation and she elected not to pursue cardioversion. Since last seen the patient denies any dyspnea on exertion, orthopnea, PND, pedal edema, palpitations, syncope or chest pain.   Current Outpatient Prescriptions  Medication Sig Dispense Refill  . albuterol (PROAIR HFA) 108 (90 Base) MCG/ACT inhaler Inhale 2 puffs into the lungs as directed.    Marland Kitchen amLODipine (NORVASC) 10 MG tablet Take 1 tablet (10 mg total) by mouth daily. 90 tablet 1  . apixaban (ELIQUIS) 5 MG TABS tablet Take 1 tablet (5 mg total) by mouth 2 (two) times daily. 180 tablet 1  . carvedilol (COREG) 25 MG tablet Take 0.5 tablets (12.5 mg total) by mouth daily. 30 tablet 2  . clopidogrel (PLAVIX) 75 MG tablet Take 75 mg by mouth daily.    Marland Kitchen doxazosin (CARDURA) 8 MG tablet Take 8 mg by mouth daily.     . DULERA 200-5 MCG/ACT AERO Inhale 2 puffs into the lungs as directed.    Marland Kitchen FLUZONE HIGH-DOSE 0.5 ML SUSY TO BE ADMINISTERED BY PHARMACIST FOR IMMUNIZATION  0  . furosemide (LASIX) 20 MG tablet Take 1 tablet by mouth daily.    Marland Kitchen losartan-hydrochlorothiazide (HYZAAR) 100-25 MG per tablet Take 1 tablet by mouth daily.    . Turmeric Curcumin 500 MG CAPS Take 1,000 mg by mouth daily.      No current facility-administered medications for this visit.      Past Medical History:  Diagnosis Date  . Arthritis   . Diabetes mellitus without complication ([place])    no meds yet  . Full dentures   . Hypertension   . Hypertension   . Stenosis of right  middle cerebral artery 2011  . Wears glasses     Past Surgical History:  Procedure Laterality Date  . CEREBRAL ANGIOGRAM    . COLONOSCOPY    . DIAGNOSTIC LAPAROSCOPY     fibroid  . MASS EXCISION Right 04/27/2014   Procedure: EXCISION MASS RIGHT RING FINGER DEBRIDEMENT PROXIMAL INTERPHALANGEAL JOINT RIGHT RING FINGER (PIP);  Surgeon: Wynonia Sours, MD;  Location: [facility];  Service: Orthopedics;  Laterality: Right;    Social History   Social History  . Marital status: Married    Spouse name: N/A  . Number of children: 2  . Years of education: N/A   Occupational History  . Not on file.   Social History Main Topics  . Smoking status: Never Smoker  . Smokeless tobacco: Never Used  . Alcohol use Yes     Comment: Occasional  . Drug use: No  . Sexual activity: Not on file   Other Topics Concern  . Not on file   Social History Narrative  . No narrative on file    Family History  Problem Relation Age of Onset  . Dementia Mother   . Hypertension Mother   . Cancer Father   . Obesity Sister     ROS: no fevers or chills, productive cough, hemoptysis, dysphasia, odynophagia, melena, hematochezia, dysuria, hematuria, rash, seizure activity, orthopnea,  PND, pedal edema, claudication. Remaining systems are negative.  Physical Exam: Well-developed obese in no acute distress.  Skin is warm and dry.  HEENT is normal.  Neck is supple.  Chest is clear to auscultation with normal expansion.  Cardiovascular exam is irregular Abdominal exam nontender or distended. No masses palpated. Extremities show no edema. neuro grossly intact   A/P  1 Atrial fibrillation-plan to continue carvedilol for rate control. Continue apixaban. Patient has declined cardioversion. I explained that it would be difficult to reestablish sinus in the future the longer she is in atrial fibrillation. Check hgb and renal function.  2 hypertension-blood pressure is controlled. Continue  present medications.   3 history of right middle cerebral artery stenosis-continue Plavix.  Kirk Ruths, MD

## 2017-05-29 ENCOUNTER — Encounter: Payer: Self-pay | Admitting: Cardiology

## 2017-05-29 ENCOUNTER — Ambulatory Visit (INDEPENDENT_AMBULATORY_CARE_PROVIDER_SITE_OTHER): Payer: Medicare Other | Admitting: Cardiology

## 2017-05-29 VITALS — BP 140/76 | HR 78 | Ht 65.0 in | Wt 259.0 lb

## 2017-05-29 DIAGNOSIS — I4819 Other persistent atrial fibrillation: Secondary | ICD-10-CM

## 2017-05-29 DIAGNOSIS — I481 Persistent atrial fibrillation: Secondary | ICD-10-CM

## 2017-05-29 DIAGNOSIS — I10 Essential (primary) hypertension: Secondary | ICD-10-CM

## 2017-05-29 LAB — BASIC METABOLIC PANEL
BUN / CREAT RATIO: 21 (ref 12–28)
BUN: 16 mg/dL (ref 8–27)
CALCIUM: 9.5 mg/dL (ref 8.7–10.3)
CHLORIDE: 101 mmol/L (ref 96–106)
CO2: 23 mmol/L (ref 20–29)
Creatinine, Ser: 0.75 mg/dL (ref 0.57–1.00)
GFR, EST AFRICAN AMERICAN: 95 mL/min/{1.73_m2} (ref >59)
GFR, EST NON AFRICAN AMERICAN: 82 mL/min/{1.73_m2} (ref >59)
Glucose: 112 mg/dL — ABNORMAL HIGH (ref 65–99)
POTASSIUM: 3.9 mmol/L (ref 3.5–5.2)
Sodium: 142 mmol/L (ref 134–144)

## 2017-05-29 LAB — CBC
HEMATOCRIT: 40.3 % (ref 34.0–46.6)
Hemoglobin: 13.4 g/dL (ref 11.1–15.9)
MCH: 29.2 pg (ref 26.6–33.0)
MCHC: 33.3 g/dL (ref 31.5–35.7)
MCV: 88 fL (ref 79–97)
PLATELETS: 274 10*3/uL (ref 150–379)
RBC: 4.59 x10E6/uL (ref 3.77–5.28)
RDW: 14.8 % (ref 12.3–15.4)
WBC: 7.1 10*3/uL (ref 3.4–10.8)

## 2017-05-29 NOTE — Patient Instructions (Signed)

## 2017-06-03 DIAGNOSIS — F411 Generalized anxiety disorder: Secondary | ICD-10-CM | POA: Diagnosis not present

## 2017-06-03 DIAGNOSIS — F410 Panic disorder [episodic paroxysmal anxiety] without agoraphobia: Secondary | ICD-10-CM | POA: Diagnosis not present

## 2017-06-26 DIAGNOSIS — R635 Abnormal weight gain: Secondary | ICD-10-CM | POA: Diagnosis not present

## 2017-07-03 DIAGNOSIS — E8881 Metabolic syndrome: Secondary | ICD-10-CM | POA: Diagnosis not present

## 2017-07-03 DIAGNOSIS — R7303 Prediabetes: Secondary | ICD-10-CM | POA: Diagnosis not present

## 2017-07-03 DIAGNOSIS — R7989 Other specified abnormal findings of blood chemistry: Secondary | ICD-10-CM | POA: Diagnosis not present

## 2017-07-03 DIAGNOSIS — I10 Essential (primary) hypertension: Secondary | ICD-10-CM | POA: Diagnosis not present

## 2017-07-03 DIAGNOSIS — E786 Lipoprotein deficiency: Secondary | ICD-10-CM | POA: Diagnosis not present

## 2017-07-03 DIAGNOSIS — R635 Abnormal weight gain: Secondary | ICD-10-CM | POA: Diagnosis not present

## 2017-07-03 DIAGNOSIS — Z6841 Body Mass Index (BMI) 40.0 and over, adult: Secondary | ICD-10-CM | POA: Diagnosis not present

## 2017-07-04 DIAGNOSIS — I4891 Unspecified atrial fibrillation: Secondary | ICD-10-CM | POA: Diagnosis not present

## 2017-07-19 ENCOUNTER — Other Ambulatory Visit: Payer: Self-pay | Admitting: Cardiology

## 2017-07-21 NOTE — Telephone Encounter (Signed)
Rx(s) sent to pharmacy electronically.  

## 2017-07-28 DIAGNOSIS — N390 Urinary tract infection, site not specified: Secondary | ICD-10-CM | POA: Diagnosis not present

## 2017-07-31 DIAGNOSIS — F320 Major depressive disorder, single episode, mild: Secondary | ICD-10-CM | POA: Diagnosis not present

## 2017-07-31 DIAGNOSIS — F410 Panic disorder [episodic paroxysmal anxiety] without agoraphobia: Secondary | ICD-10-CM | POA: Diagnosis not present

## 2017-08-06 DIAGNOSIS — Z0000 Encounter for general adult medical examination without abnormal findings: Secondary | ICD-10-CM | POA: Diagnosis not present

## 2017-08-06 DIAGNOSIS — R300 Dysuria: Secondary | ICD-10-CM | POA: Diagnosis not present

## 2017-08-15 DIAGNOSIS — F410 Panic disorder [episodic paroxysmal anxiety] without agoraphobia: Secondary | ICD-10-CM | POA: Diagnosis not present

## 2017-08-15 DIAGNOSIS — F320 Major depressive disorder, single episode, mild: Secondary | ICD-10-CM | POA: Diagnosis not present

## 2017-09-01 DIAGNOSIS — Z111 Encounter for screening for respiratory tuberculosis: Secondary | ICD-10-CM | POA: Diagnosis not present

## 2017-09-03 DIAGNOSIS — Z23 Encounter for immunization: Secondary | ICD-10-CM | POA: Diagnosis not present

## 2017-09-17 ENCOUNTER — Other Ambulatory Visit: Payer: Self-pay | Admitting: Cardiology

## 2017-09-17 DIAGNOSIS — I4891 Unspecified atrial fibrillation: Secondary | ICD-10-CM

## 2017-11-13 ENCOUNTER — Ambulatory Visit
Admission: RE | Admit: 2017-11-13 | Discharge: 2017-11-13 | Disposition: A | Discharge status: Home / Self Care | Payer: Medicare Other | Source: Ambulatory Visit | Attending: Family Medicine | Admitting: Family Medicine

## 2017-11-13 ENCOUNTER — Other Ambulatory Visit: Payer: Self-pay | Admitting: Family Medicine

## 2017-11-13 DIAGNOSIS — R52 Pain, unspecified: Secondary | ICD-10-CM

## 2017-11-13 DIAGNOSIS — R269 Unspecified abnormalities of gait and mobility: Secondary | ICD-10-CM | POA: Diagnosis not present

## 2017-11-13 DIAGNOSIS — M25551 Pain in right hip: Secondary | ICD-10-CM | POA: Diagnosis not present

## 2017-11-13 DIAGNOSIS — M1611 Unilateral primary osteoarthritis, right hip: Secondary | ICD-10-CM | POA: Diagnosis not present

## 2017-11-18 DIAGNOSIS — R05 Cough: Secondary | ICD-10-CM | POA: Diagnosis not present

## 2017-11-18 DIAGNOSIS — M79604 Pain in right leg: Secondary | ICD-10-CM | POA: Diagnosis not present

## 2017-11-18 DIAGNOSIS — J019 Acute sinusitis, unspecified: Secondary | ICD-10-CM | POA: Diagnosis not present

## 2017-11-18 DIAGNOSIS — M79605 Pain in left leg: Secondary | ICD-10-CM | POA: Diagnosis not present

## 2017-11-20 DIAGNOSIS — H2513 Age-related nuclear cataract, bilateral: Secondary | ICD-10-CM | POA: Diagnosis not present

## 2017-11-20 DIAGNOSIS — H524 Presbyopia: Secondary | ICD-10-CM | POA: Diagnosis not present

## 2017-11-20 DIAGNOSIS — H52222 Regular astigmatism, left eye: Secondary | ICD-10-CM | POA: Diagnosis not present

## 2017-11-20 DIAGNOSIS — H5213 Myopia, bilateral: Secondary | ICD-10-CM | POA: Diagnosis not present

## 2017-11-25 DIAGNOSIS — M79605 Pain in left leg: Secondary | ICD-10-CM | POA: Diagnosis not present

## 2017-11-25 DIAGNOSIS — M79604 Pain in right leg: Secondary | ICD-10-CM | POA: Diagnosis not present

## 2017-11-27 DIAGNOSIS — M79604 Pain in right leg: Secondary | ICD-10-CM | POA: Diagnosis not present

## 2017-11-27 DIAGNOSIS — M79605 Pain in left leg: Secondary | ICD-10-CM | POA: Diagnosis not present

## 2017-12-01 DIAGNOSIS — M79604 Pain in right leg: Secondary | ICD-10-CM | POA: Diagnosis not present

## 2017-12-01 DIAGNOSIS — M79605 Pain in left leg: Secondary | ICD-10-CM | POA: Diagnosis not present

## 2017-12-08 DIAGNOSIS — M79604 Pain in right leg: Secondary | ICD-10-CM | POA: Diagnosis not present

## 2017-12-08 DIAGNOSIS — M79605 Pain in left leg: Secondary | ICD-10-CM | POA: Diagnosis not present

## 2017-12-11 DIAGNOSIS — Z1389 Encounter for screening for other disorder: Secondary | ICD-10-CM | POA: Diagnosis not present

## 2017-12-11 DIAGNOSIS — M79604 Pain in right leg: Secondary | ICD-10-CM | POA: Diagnosis not present

## 2017-12-11 DIAGNOSIS — J309 Allergic rhinitis, unspecified: Secondary | ICD-10-CM | POA: Diagnosis not present

## 2017-12-11 DIAGNOSIS — I679 Cerebrovascular disease, unspecified: Secondary | ICD-10-CM | POA: Diagnosis not present

## 2017-12-11 DIAGNOSIS — I10 Essential (primary) hypertension: Secondary | ICD-10-CM | POA: Diagnosis not present

## 2017-12-11 DIAGNOSIS — E1139 Type 2 diabetes mellitus with other diabetic ophthalmic complication: Secondary | ICD-10-CM | POA: Diagnosis not present

## 2017-12-11 DIAGNOSIS — E041 Nontoxic single thyroid nodule: Secondary | ICD-10-CM | POA: Diagnosis not present

## 2017-12-11 DIAGNOSIS — R809 Proteinuria, unspecified: Secondary | ICD-10-CM | POA: Diagnosis not present

## 2017-12-11 DIAGNOSIS — Z0000 Encounter for general adult medical examination without abnormal findings: Secondary | ICD-10-CM | POA: Diagnosis not present

## 2017-12-11 DIAGNOSIS — Z136 Encounter for screening for cardiovascular disorders: Secondary | ICD-10-CM | POA: Diagnosis not present

## 2017-12-11 DIAGNOSIS — E1129 Type 2 diabetes mellitus with other diabetic kidney complication: Secondary | ICD-10-CM | POA: Diagnosis not present

## 2017-12-11 DIAGNOSIS — M79605 Pain in left leg: Secondary | ICD-10-CM | POA: Diagnosis not present

## 2017-12-22 NOTE — Progress Notes (Signed)
HPI: FU atrial fibrillation and dyspnea. Chest x-ray November 2017 showed CHF with mild interstitial edema and small pleural effusions.Patient states she was told 20 years ago that she may have had a heart attack. This was in [place]. No records available. Echocardiogram December [id number] normal LV systolic function, mild left atrial enlargement. Nuclear study December 2017 showed ejection fraction 51% and no ischemia or infarction. At last ov, pt noted to be in atrial fibrillation and she elected not to pursue cardioversion. Since last seen the patient denies any dyspnea on exertion, orthopnea, PND, pedal edema, palpitations, syncope or chest pain.   Current Outpatient Medications  Medication Sig Dispense Refill  . amLODipine (NORVASC) 10 MG tablet TAKE 1 TABLET DAILY 90 tablet 2  . apixaban (ELIQUIS) 5 MG TABS tablet Take 1 tablet (5 mg total) by mouth 2 (two) times daily. 180 tablet 1  . carvedilol (COREG) 25 MG tablet Take 0.5 tablets (12.5 mg total) by mouth daily. 30 tablet 2  . clopidogrel (PLAVIX) 75 MG tablet Take 75 mg by mouth daily.    Marland Kitchen doxazosin (CARDURA) 8 MG tablet Take 8 mg by mouth daily.     . DULERA 200-5 MCG/ACT AERO Inhale 2 puffs into the lungs as needed.     Marland Kitchen losartan-hydrochlorothiazide (HYZAAR) 100-25 MG per tablet Take 1 tablet by mouth daily.    . Turmeric Curcumin 500 MG CAPS Take 1,000 mg by mouth daily.     Marland Kitchen albuterol (PROAIR HFA) 108 (90 Base) MCG/ACT inhaler Inhale 2 puffs into the lungs as directed.    Marland Kitchen ELIQUIS 5 MG TABS tablet TAKE 1 TABLET TWICE A DAY 180 tablet 1  . FLUZONE HIGH-DOSE 0.5 ML SUSY TO BE ADMINISTERED BY PHARMACIST FOR IMMUNIZATION  0  . furosemide (LASIX) 20 MG tablet Take 1 tablet by mouth daily.     No current facility-administered medications for this visit.      Past Medical History:  Diagnosis Date  . Arthritis   . Diabetes mellitus without complication ([facility])    no meds yet  . Full dentures   . Hypertension     . Hypertension   . Stenosis of right middle cerebral artery 2011  . Wears glasses     Past Surgical History:  Procedure Laterality Date  . CEREBRAL ANGIOGRAM    . COLONOSCOPY    . DIAGNOSTIC LAPAROSCOPY     fibroid  . MASS EXCISION Right 04/27/2014   Procedure: EXCISION MASS RIGHT RING FINGER DEBRIDEMENT PROXIMAL INTERPHALANGEAL JOINT RIGHT RING FINGER (PIP);  Surgeon: Wynonia Sours, MD;  Location: [facility];  Service: Orthopedics;  Laterality: Right;    Social History   Socioeconomic History  . Marital status: Married    Spouse name: Not on file  . Number of children: 2  . Years of education: Not on file  . Highest education level: Not on file  Social Needs  . Financial resource strain: Not on file  . Food insecurity - worry: Not on file  . Food insecurity - inability: Not on file  . Transportation needs - medical: Not on file  . Transportation needs - non-medical: Not on file  Occupational History  . Not on file  Tobacco Use  . Smoking status: Never Smoker  . Smokeless tobacco: Never Used  Substance and Sexual Activity  . Alcohol use: Yes    Comment: Occasional  . Drug use: No  . Sexual activity: Not on file  Other  Topics Concern  . Not on file  Social History Narrative  . Not on file    Family History  Problem Relation Age of Onset  . Dementia Mother   . Hypertension Mother   . Cancer Father   . Obesity Sister     ROS: no fevers or chills, productive cough, hemoptysis, dysphasia, odynophagia, melena, hematochezia, dysuria, hematuria, rash, seizure activity, orthopnea, PND, pedal edema, claudication. Remaining systems are negative.  Physical Exam: Well-developed well-nourished in no acute distress.  Skin is warm and dry.  HEENT is normal.  Neck is supple.  Chest is clear to auscultation with normal expansion.  Cardiovascular exam is regular rate and rhythm.  Abdominal exam nontender or distended. No masses palpated. Extremities show no  edema. neuro grossly intact  ECG- Atrial fibrillation; cannot R/O septal MI; nonspecific ST changes; personally reviewed  A/P  1 atrial fibrillation-patient previously declined cardioversion.  Our plan is rate control and anticoagulation.  Continue carvedilol.  Continue apixaban.  Check hemoglobin and renal function.  2 hypertension-blood pressure controlled.  Continue present medication.  3 history of right middle cerebral artery stenosis-continue Plavix.  Kirk Ruths, MD

## 2018-01-01 ENCOUNTER — Ambulatory Visit (INDEPENDENT_AMBULATORY_CARE_PROVIDER_SITE_OTHER): Payer: Medicare Other | Admitting: Cardiology

## 2018-01-01 ENCOUNTER — Encounter: Payer: Self-pay | Admitting: Cardiology

## 2018-01-01 VITALS — BP 132/70 | HR 70 | Ht 64.0 in | Wt 242.0 lb

## 2018-01-01 DIAGNOSIS — I481 Persistent atrial fibrillation: Secondary | ICD-10-CM

## 2018-01-01 DIAGNOSIS — I4819 Other persistent atrial fibrillation: Secondary | ICD-10-CM

## 2018-01-01 DIAGNOSIS — Z8673 Personal history of transient ischemic attack (TIA), and cerebral infarction without residual deficits: Secondary | ICD-10-CM | POA: Diagnosis not present

## 2018-01-01 DIAGNOSIS — I10 Essential (primary) hypertension: Secondary | ICD-10-CM

## 2018-01-01 NOTE — Patient Instructions (Signed)
Medication Instructions:   NO CHANGE  Labwork:  Your physician recommends that you HAVE LAB WORK TODAY  Follow-Up:  Your physician wants you to follow-up in: ONE YEAR WITH DR CRENSHAW You will receive a reminder letter in the mail two months in advance. If you don't receive a letter, please call our office to schedule the follow-up appointment.   If you need a refill on your cardiac medications before your next appointment, please call your pharmacy.    

## 2018-01-02 LAB — CBC
HEMATOCRIT: 39.6 % (ref 34.0–46.6)
Hemoglobin: 13.5 g/dL (ref 11.1–15.9)
MCH: 29.9 pg (ref 26.6–33.0)
MCHC: 34.1 g/dL (ref 31.5–35.7)
MCV: 88 fL (ref 79–97)
Platelets: 235 10*3/uL (ref 150–379)
RBC: 4.52 x10E6/uL (ref 3.77–5.28)
RDW: 14.9 % (ref 12.3–15.4)
WBC: 6.8 10*3/uL (ref 3.4–10.8)

## 2018-01-02 LAB — BASIC METABOLIC PANEL
BUN / CREAT RATIO: 28 (ref 12–28)
BUN: 23 mg/dL (ref 8–27)
CO2: 26 mmol/L (ref 20–29)
CREATININE: 0.81 mg/dL (ref 0.57–1.00)
Calcium: 9.4 mg/dL (ref 8.7–10.3)
Chloride: 104 mmol/L (ref 96–106)
GFR calc Af Amer: 86 mL/min/{1.73_m2} (ref >59)
GFR, EST NON AFRICAN AMERICAN: 74 mL/min/{1.73_m2} (ref >59)
Glucose: 117 mg/dL — ABNORMAL HIGH (ref 65–99)
Potassium: 3.8 mmol/L (ref 3.5–5.2)
SODIUM: 144 mmol/L (ref 134–144)

## 2018-01-05 ENCOUNTER — Encounter: Payer: Self-pay | Admitting: *Deleted

## 2018-03-02 DIAGNOSIS — Z1231 Encounter for screening mammogram for malignant neoplasm of breast: Secondary | ICD-10-CM | POA: Diagnosis not present

## 2018-03-22 ENCOUNTER — Other Ambulatory Visit: Payer: Self-pay | Admitting: Cardiology

## 2018-03-22 DIAGNOSIS — I4891 Unspecified atrial fibrillation: Secondary | ICD-10-CM

## 2018-05-13 ENCOUNTER — Other Ambulatory Visit: Payer: Self-pay | Admitting: Cardiology

## 2018-06-03 DIAGNOSIS — M79672 Pain in left foot: Secondary | ICD-10-CM | POA: Diagnosis not present

## 2018-06-11 ENCOUNTER — Encounter: Payer: Self-pay | Admitting: Podiatry

## 2018-06-11 ENCOUNTER — Other Ambulatory Visit: Payer: Self-pay | Admitting: Podiatry

## 2018-06-11 ENCOUNTER — Ambulatory Visit (INDEPENDENT_AMBULATORY_CARE_PROVIDER_SITE_OTHER): Payer: Medicare Other

## 2018-06-11 ENCOUNTER — Ambulatory Visit (INDEPENDENT_AMBULATORY_CARE_PROVIDER_SITE_OTHER): Payer: Medicare Other | Admitting: Podiatry

## 2018-06-11 DIAGNOSIS — M79672 Pain in left foot: Secondary | ICD-10-CM

## 2018-06-11 DIAGNOSIS — M722 Plantar fascial fibromatosis: Secondary | ICD-10-CM

## 2018-06-11 DIAGNOSIS — M19072 Primary osteoarthritis, left ankle and foot: Secondary | ICD-10-CM

## 2018-06-11 DIAGNOSIS — M1990 Unspecified osteoarthritis, unspecified site: Secondary | ICD-10-CM | POA: Diagnosis not present

## 2018-06-11 DIAGNOSIS — I10 Essential (primary) hypertension: Secondary | ICD-10-CM | POA: Diagnosis not present

## 2018-06-11 DIAGNOSIS — I679 Cerebrovascular disease, unspecified: Secondary | ICD-10-CM | POA: Diagnosis not present

## 2018-06-11 DIAGNOSIS — E1139 Type 2 diabetes mellitus with other diabetic ophthalmic complication: Secondary | ICD-10-CM | POA: Diagnosis not present

## 2018-06-11 DIAGNOSIS — M79671 Pain in right foot: Secondary | ICD-10-CM

## 2018-06-11 DIAGNOSIS — I481 Persistent atrial fibrillation: Secondary | ICD-10-CM | POA: Diagnosis not present

## 2018-06-11 DIAGNOSIS — R809 Proteinuria, unspecified: Secondary | ICD-10-CM | POA: Diagnosis not present

## 2018-06-11 DIAGNOSIS — M19071 Primary osteoarthritis, right ankle and foot: Secondary | ICD-10-CM

## 2018-06-11 DIAGNOSIS — M7730 Calcaneal spur, unspecified foot: Secondary | ICD-10-CM | POA: Diagnosis not present

## 2018-06-11 DIAGNOSIS — E041 Nontoxic single thyroid nodule: Secondary | ICD-10-CM | POA: Diagnosis not present

## 2018-06-11 DIAGNOSIS — E1129 Type 2 diabetes mellitus with other diabetic kidney complication: Secondary | ICD-10-CM | POA: Diagnosis not present

## 2018-06-11 DIAGNOSIS — E113292 Type 2 diabetes mellitus with mild nonproliferative diabetic retinopathy without macular edema, left eye: Secondary | ICD-10-CM | POA: Diagnosis not present

## 2018-06-11 DIAGNOSIS — J309 Allergic rhinitis, unspecified: Secondary | ICD-10-CM | POA: Diagnosis not present

## 2018-06-11 DIAGNOSIS — I509 Heart failure, unspecified: Secondary | ICD-10-CM | POA: Diagnosis not present

## 2018-06-11 NOTE — Patient Instructions (Addendum)

## 2018-06-11 NOTE — Progress Notes (Signed)
Subjective:    Patient ID: Cynthia Frost, female    DOB: 1951-09-23, 67 y.o.   MRN: [id number]  HPI 67 year old female presents the office with concerns of bilateral foot pain, point to the bottom of both of her heels.  She states that she has a lot lately after she stopped her feet has started to hurt.  This is been ongoing about 6 weeks.  She does have a history of using orthotics that she has been good feet store about 7 years ago and there were no longer comfortable.  She states that the left side is worse than the right.  This is been ongoing for quite some time but got worse after she stopped it, is active.  She denies any numbness or tingling.  No recent swelling or redness.  She has no other concerns.   Review of Systems  All other systems reviewed and are negative.  Past Medical History:  Diagnosis Date  . Arthritis   . Diabetes mellitus without complication ([facility])    no meds yet  . Full dentures   . Hypertension   . Hypertension   . Stenosis of right middle cerebral artery 2011  . Wears glasses     Past Surgical History:  Procedure Laterality Date  . CEREBRAL ANGIOGRAM    . COLONOSCOPY    . DIAGNOSTIC LAPAROSCOPY     fibroid  . MASS EXCISION Right 04/27/2014   Procedure: EXCISION MASS RIGHT RING FINGER DEBRIDEMENT PROXIMAL INTERPHALANGEAL JOINT RIGHT RING FINGER (PIP);  Surgeon: Wynonia Sours, MD;  Location: [facility];  Service: Orthopedics;  Laterality: Right;     Current Outpatient Medications:  .  albuterol (PROAIR HFA) 108 (90 Base) MCG/ACT inhaler, Inhale 2 puffs into the lungs as directed., Disp: , Rfl:  .  amLODipine (NORVASC) 10 MG tablet, TAKE 1 TABLET DAILY, Disp: 90 tablet, Rfl: 2 .  apixaban (ELIQUIS) 5 MG TABS tablet, Take 1 tablet (5 mg total) by mouth 2 (two) times daily., Disp: 180 tablet, Rfl: 1 .  carvedilol (COREG) 25 MG tablet, Take 0.5 tablets (12.5 mg total) by mouth daily., Disp: 30 tablet, Rfl: 2 .  clopidogrel (PLAVIX) 75 MG  tablet, Take 75 mg by mouth daily., Disp: , Rfl:  .  doxazosin (CARDURA) 8 MG tablet, Take 8 mg by mouth daily. , Disp: , Rfl:  .  DULERA 200-5 MCG/ACT AERO, Inhale 2 puffs into the lungs as needed. , Disp: , Rfl:  .  ELIQUIS 5 MG TABS tablet, TAKE 1 TABLET TWICE A DAY, Disp: 180 tablet, Rfl: 1 .  FLUZONE HIGH-DOSE 0.5 ML SUSY, TO BE ADMINISTERED BY PHARMACIST FOR IMMUNIZATION, Disp: , Rfl: 0 .  furosemide (LASIX) 20 MG tablet, Take 1 tablet by mouth daily., Disp: , Rfl:  .  losartan-hydrochlorothiazide (HYZAAR) 100-25 MG per tablet, Take 1 tablet by mouth daily., Disp: , Rfl:  .  Turmeric Curcumin 500 MG CAPS, Take 1,000 mg by mouth daily. , Disp: , Rfl:   Allergies  Allergen Reactions  . Corticosteroids Other (See Comments)    Increases glucose levels         Objective:   Physical Exam General: AAO x3, NAD  Dermatological: Skin is warm, dry and supple bilateral. Nails x 10 are well manicured; remaining integument appears unremarkable at this time. There are no open sores, no preulcerative lesions, no rash or signs of infection present.  Vascular: Dorsalis Pedis artery and Posterior Tibial artery pedal pulses are 2/4 bilateral with  immedate capillary fill time.There is no pain with calf compression, swelling, warmth, erythema.   Neruologic: Grossly intact via light touch bilateral.Protective threshold with Semmes Wienstein monofilament intact to all pedal sites bilateral.  Negative Tinel sign.  Musculoskeletal: There is tenderness palpation of the plantar medial tubercle of the insertion of plantar fascia bilaterally.  There is no pain with lateral compression of the calcaneus.  Achilles tendon intact.  There is no area pinpoint tenderness or pain to vibratory sensation.  No edema, erythema, increase in warmth.  Muscular strength 5/5 in all groups tested bilateral.  Flatfoot is present.  Gait: Unassisted, Nonantalgic.      Assessment & Plan:  67 year old female with bilateral foot  pain, likely plantar fasciitis -Treatment options discussed including all alternatives, risks, and complications -Etiology of symptoms were discussed -X-rays were obtained and reviewed with the patient.  Calcaneal spurring is present.  Arthritic changes present in the foot.  No definitive evidence of acute fracture or stress fracture identified today. -Her old orthotics have worn out.  I will have her follow-up with Liliane Channel to get molded for new orthotics. -Discussed stretching, icing daily. -She wishes to hold off on steroid injection.  Unfortunately given her anticoagulation cannot to anti-inflammatories. -Plantar fascial brace dispensed bilaterally. -Discussed shoe modifications.  Trula Slade DPM

## 2018-06-16 ENCOUNTER — Telehealth: Payer: Self-pay | Admitting: Podiatry

## 2018-06-16 DIAGNOSIS — M722 Plantar fascial fibromatosis: Secondary | ICD-10-CM | POA: Insufficient documentation

## 2018-06-16 NOTE — Telephone Encounter (Signed)
Called pt per Dr Jacqualyn Posey. He discussed orthotics with pt but pt did not get scheduled to see Liliane Channel. Lvm for pt to call to possibly discuss an appt.

## 2018-06-26 DIAGNOSIS — J019 Acute sinusitis, unspecified: Secondary | ICD-10-CM | POA: Diagnosis not present

## 2018-06-30 ENCOUNTER — Ambulatory Visit: Payer: Medicare Other | Admitting: Orthotics

## 2018-06-30 DIAGNOSIS — M722 Plantar fascial fibromatosis: Secondary | ICD-10-CM

## 2018-06-30 DIAGNOSIS — M19072 Primary osteoarthritis, left ankle and foot: Secondary | ICD-10-CM

## 2018-06-30 DIAGNOSIS — M79671 Pain in right foot: Secondary | ICD-10-CM

## 2018-06-30 DIAGNOSIS — M19071 Primary osteoarthritis, right ankle and foot: Secondary | ICD-10-CM

## 2018-06-30 DIAGNOSIS — M79672 Pain in left foot: Secondary | ICD-10-CM

## 2018-06-30 NOTE — Progress Notes (Signed)
Patient came into today for casting bilateral f/o to address plantar fasciitis.  Patient reports history of foot pain involving plantar aponeurosis.  Goal is to provide longitudinal arch support and correct any RF instability due to heel eversion/inversion.  Ultimate goal is to relieve tension at pf insertion calcaneal tuberosity.  Plan on semi-rigid device addressing heel stability and relieving PF tension.     [facility] to fab

## 2018-07-21 ENCOUNTER — Encounter: Payer: Medicare Other | Admitting: Orthotics

## 2018-07-28 ENCOUNTER — Ambulatory Visit: Payer: Medicare Other | Admitting: Orthotics

## 2018-07-28 DIAGNOSIS — M7730 Calcaneal spur, unspecified foot: Secondary | ICD-10-CM

## 2018-07-28 DIAGNOSIS — M722 Plantar fascial fibromatosis: Secondary | ICD-10-CM

## 2018-07-28 NOTE — Progress Notes (Signed)
Patient came in today to pick up custom made foot orthotics.  The goals were accomplished and the patient reported no dissatisfaction with said orthotics.  Patient was advised of breakin period and how to report any issues. 

## 2018-09-14 IMAGING — CR DG CHEST 2V
2 series · 2 of 2 positions shown · non-contrast
Comparison: No recent prior.

CLINICAL DATA: Shortness of breath.  Wheezing.

EXAM:
CHEST  2 VIEW

[w chest pa]
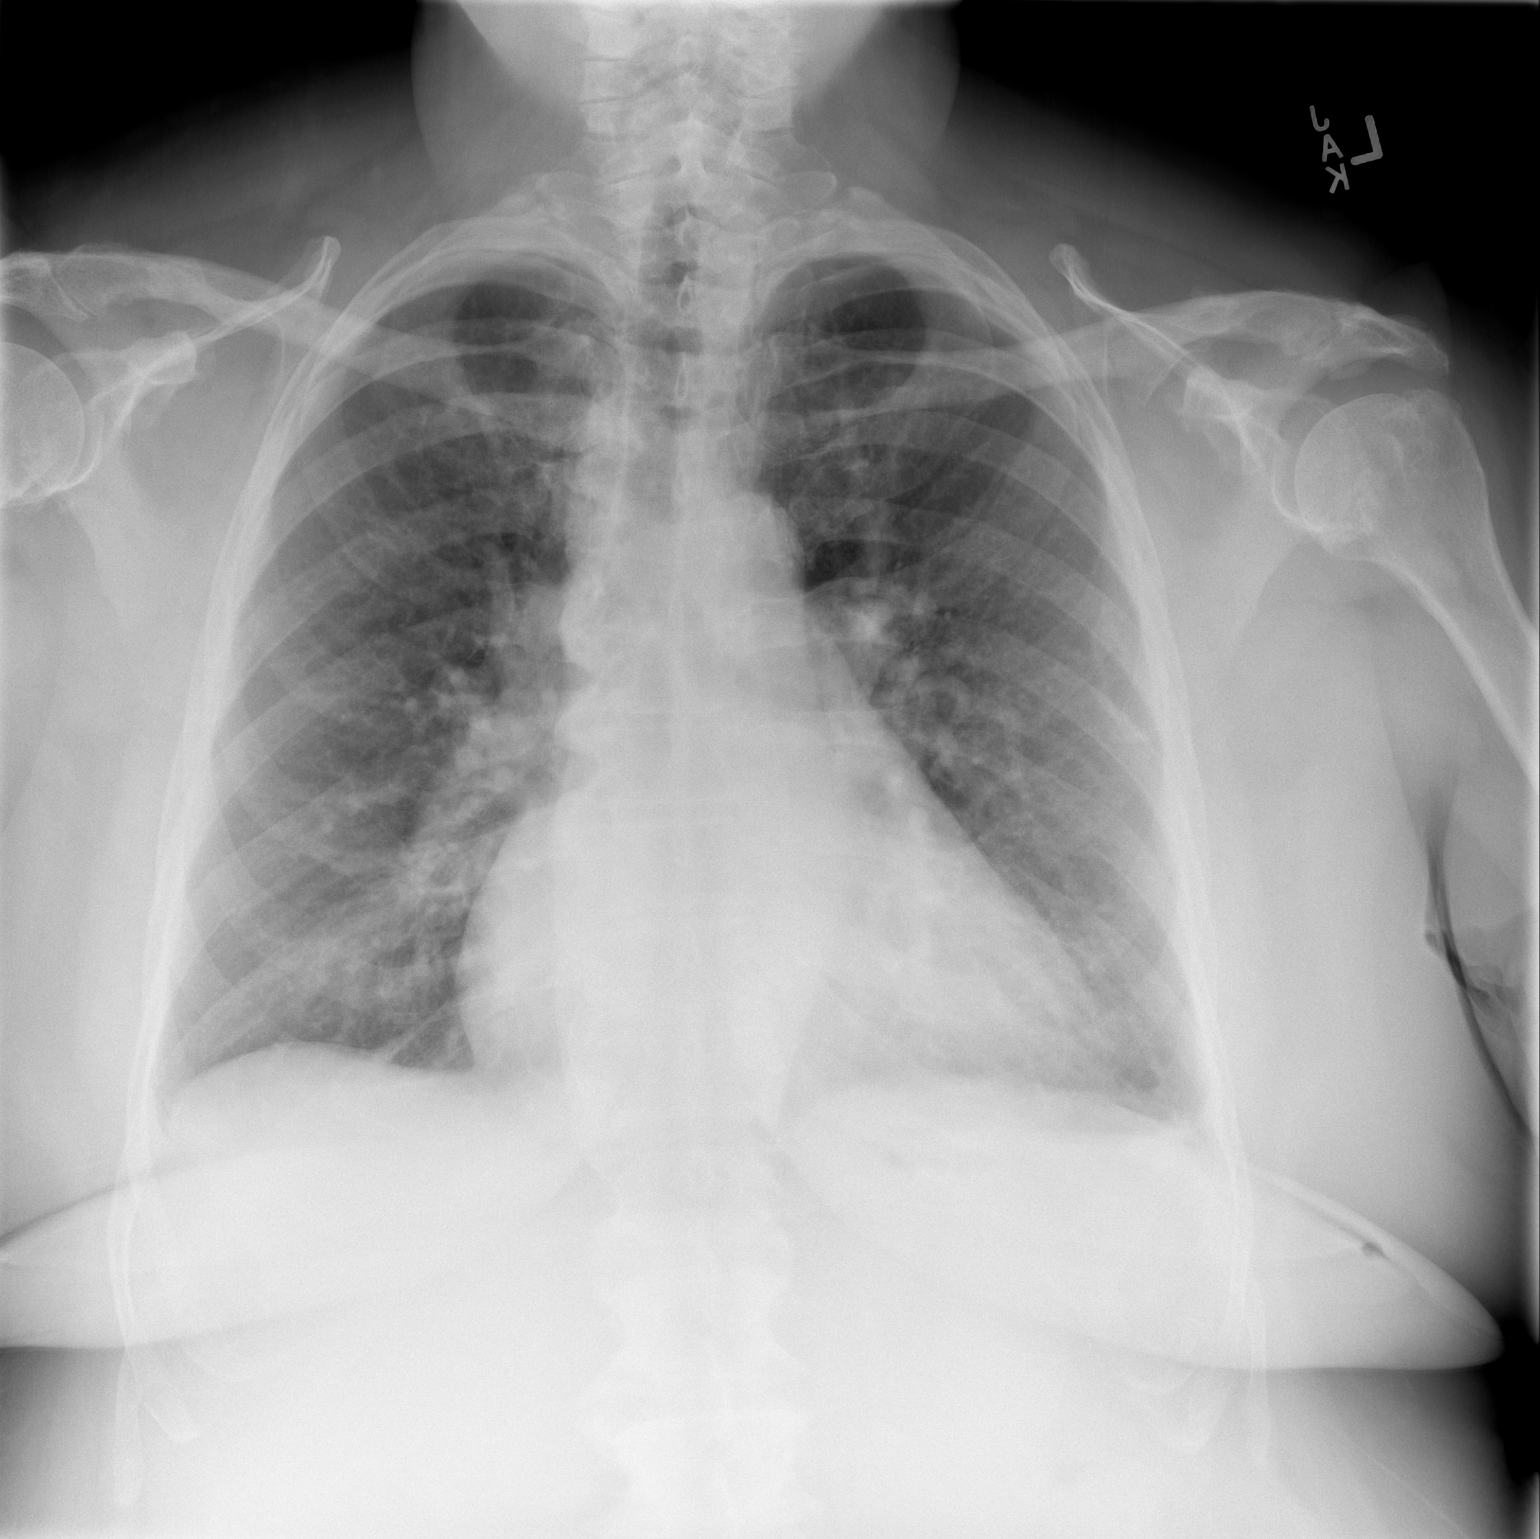

[w chest lat]
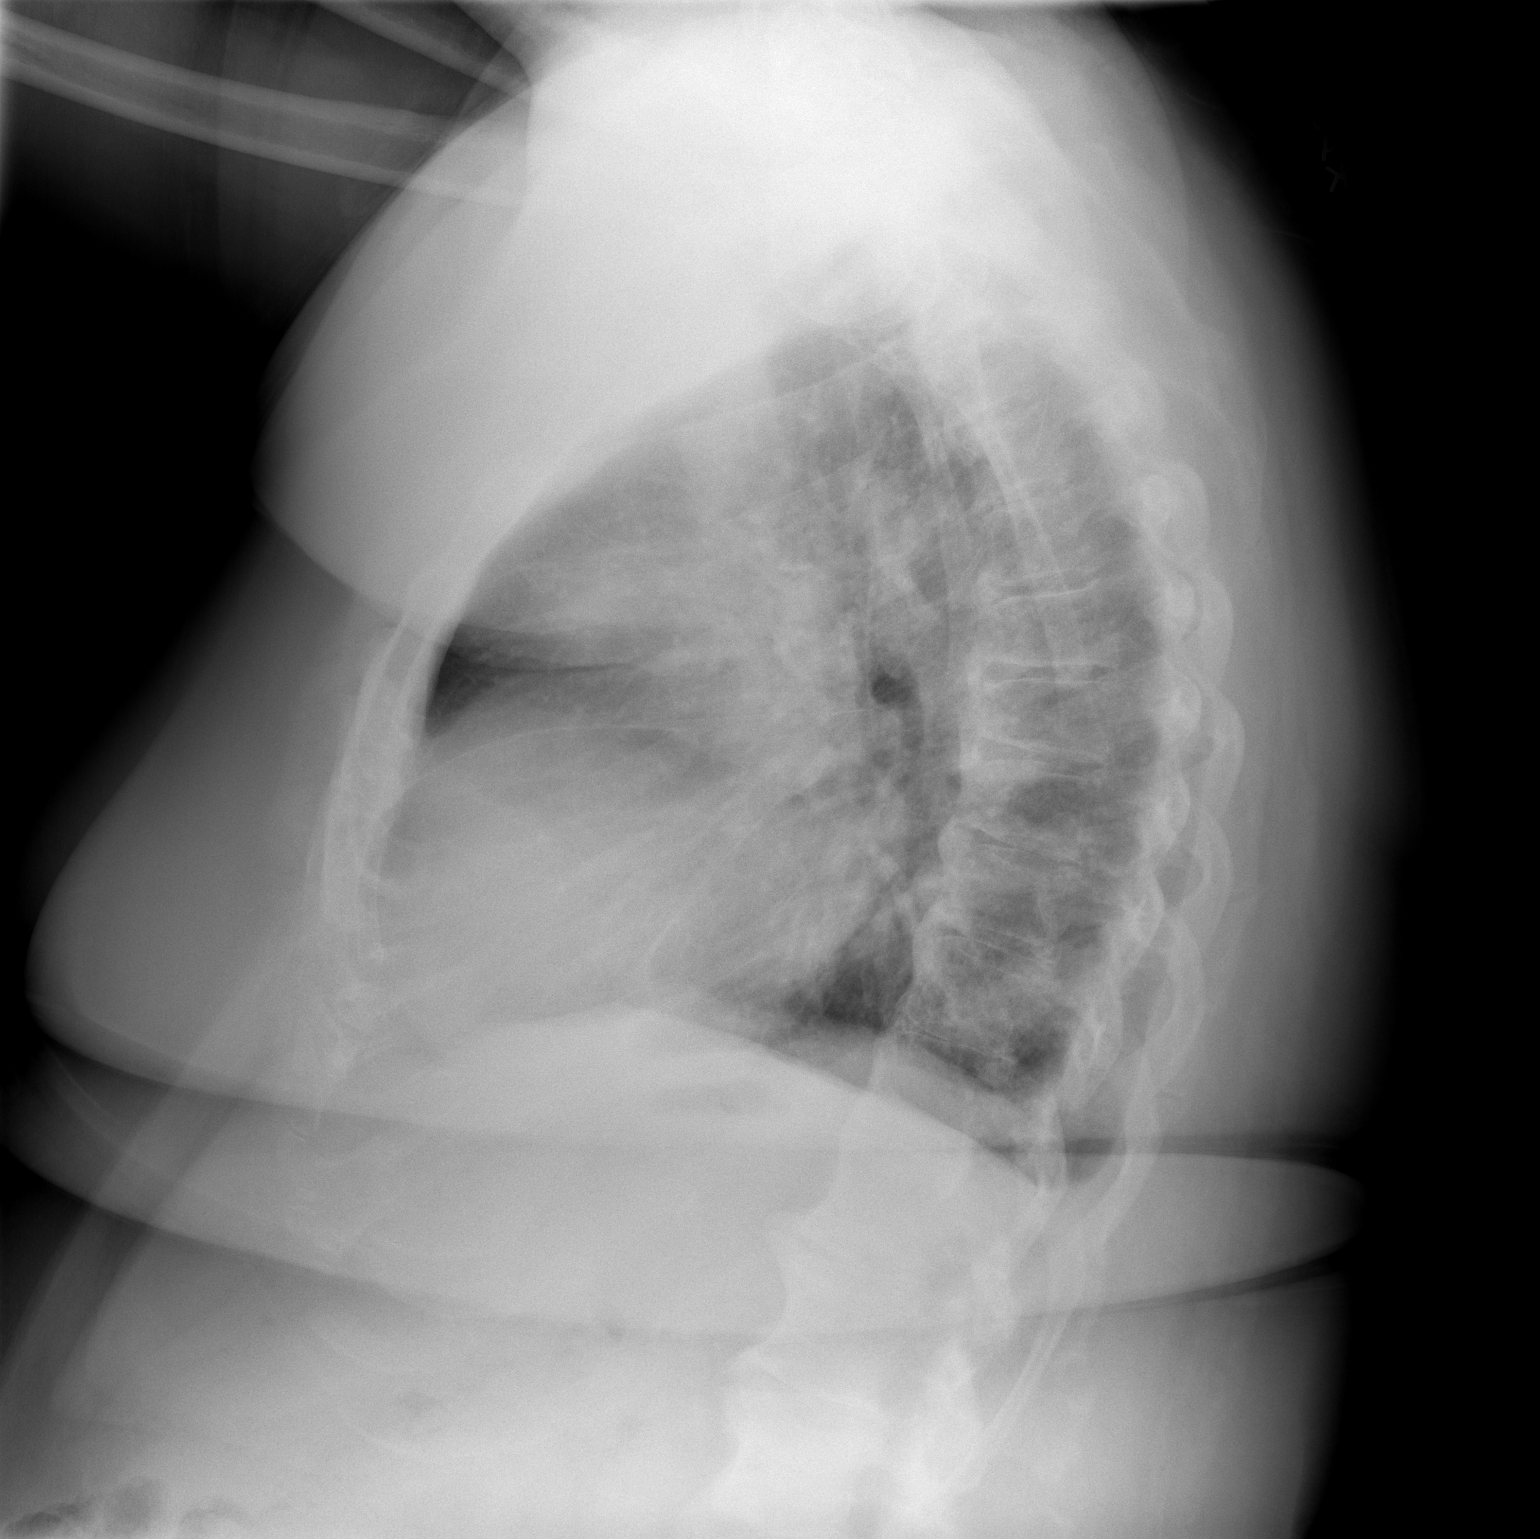

[2 of 2 positions shown; findings below may reference images not displayed]

FINDINGS: Cardiomegaly with mild pulmonary vascular prominence and
interstitial prominence suggesting congestive heart failure. Small
bilateral pleural effusions. No pneumothorax.
IMPRESSION: Congestive heart failure with mild interstitial edema and small
pleural effusions.

## 2018-09-15 ENCOUNTER — Other Ambulatory Visit: Payer: Self-pay | Admitting: Cardiology

## 2018-09-15 DIAGNOSIS — I4891 Unspecified atrial fibrillation: Secondary | ICD-10-CM

## 2018-09-15 NOTE — Telephone Encounter (Signed)
Rx request sent to pharmacy.  

## 2018-10-07 DIAGNOSIS — J309 Allergic rhinitis, unspecified: Secondary | ICD-10-CM | POA: Diagnosis not present

## 2018-10-07 DIAGNOSIS — M1990 Unspecified osteoarthritis, unspecified site: Secondary | ICD-10-CM | POA: Diagnosis not present

## 2018-10-14 DIAGNOSIS — Z23 Encounter for immunization: Secondary | ICD-10-CM | POA: Diagnosis not present

## 2018-10-23 DIAGNOSIS — M48062 Spinal stenosis, lumbar region with neurogenic claudication: Secondary | ICD-10-CM | POA: Diagnosis not present

## 2018-10-27 DIAGNOSIS — M79605 Pain in left leg: Secondary | ICD-10-CM | POA: Diagnosis not present

## 2018-10-27 DIAGNOSIS — M79604 Pain in right leg: Secondary | ICD-10-CM | POA: Diagnosis not present

## 2018-11-17 DIAGNOSIS — M79604 Pain in right leg: Secondary | ICD-10-CM | POA: Diagnosis not present

## 2018-11-17 DIAGNOSIS — M79605 Pain in left leg: Secondary | ICD-10-CM | POA: Diagnosis not present

## 2018-12-28 ENCOUNTER — Other Ambulatory Visit: Payer: Self-pay | Admitting: Cardiology

## 2018-12-28 DIAGNOSIS — I4891 Unspecified atrial fibrillation: Secondary | ICD-10-CM

## 2018-12-28 MED ORDER — APIXABAN 5 MG PO TABS: 5.0000 mg | ORAL_TABLET | Freq: Two times a day (BID) | ORAL | 1 refills | Status: DC

## 2018-12-28 NOTE — Telephone Encounter (Signed)
New Message    *STAT* If patient is at the pharmacy, call can be transferred to refill team.   1. Which medications need to be refilled? (please list name of each medication and dose if known) Eliquis 5mg    2. Which pharmacy/location (including street and city if local pharmacy) is medication to be sent to? [facility]  on [address]   3. Do they need a 30 day or 90 day supply? [place]

## 2018-12-28 NOTE — Telephone Encounter (Signed)
Pt is a 68 yr old female who last saw Dr. Stanford Breed on 2/21/9, wt at that visit was 109.8Kg. SCr on 12/01/17 was 0.81. Pt has pending OV on 01/15/19. Will refill Eliquis 5mg  BID for 30 days with 1 refill

## 2018-12-29 ENCOUNTER — Encounter: Payer: Self-pay | Admitting: Cardiology

## 2018-12-31 ENCOUNTER — Telehealth: Payer: Self-pay | Admitting: *Deleted

## 2018-12-31 NOTE — Telephone Encounter (Signed)
PA for eliquis complete and approved.

## 2019-01-12 NOTE — Progress Notes (Deleted)
HPI: FU atrial fibrillation and dyspnea. Chest x-ray November 2017 showed CHF with mild interstitial edema and small pleural effusions.Patient states she was told 20 years ago that she may have had a heart attack. This was in [place]. No records available. Echocardiogram December [id number] normal LV systolic function, mild left atrial enlargement. Nuclear study December 2017 showed ejection fraction 51% and no ischemia or infarction.At previous ov, pt noted to be in atrial fibrillation and she elected not to pursue cardioversion.Since last seen  Current Outpatient Medications  Medication Sig Dispense Refill  . albuterol (PROAIR HFA) 108 (90 Base) MCG/ACT inhaler Inhale 2 puffs into the lungs as directed.    Marland Kitchen amLODipine (NORVASC) 10 MG tablet TAKE 1 TABLET DAILY 90 tablet 2  . apixaban (ELIQUIS) 5 MG TABS tablet Take 1 tablet (5 mg total) by mouth 2 (two) times daily. 30 tablet 1  . carvedilol (COREG) 25 MG tablet Take 0.5 tablets (12.5 mg total) by mouth daily. 30 tablet 2  . clopidogrel (PLAVIX) 75 MG tablet Take 75 mg by mouth daily.    Marland Kitchen doxazosin (CARDURA) 8 MG tablet Take 8 mg by mouth daily.     . DULERA 200-5 MCG/ACT AERO Inhale 2 puffs into the lungs as needed.     Marland Kitchen ELIQUIS 5 MG TABS tablet TAKE 1 TABLET TWICE A DAY 180 tablet 1  . FLUZONE HIGH-DOSE 0.5 ML SUSY TO BE ADMINISTERED BY PHARMACIST FOR IMMUNIZATION  0  . furosemide (LASIX) 20 MG tablet Take 1 tablet by mouth daily.    Marland Kitchen losartan-hydrochlorothiazide (HYZAAR) 100-25 MG per tablet Take 1 tablet by mouth daily.    . Turmeric Curcumin 500 MG CAPS Take 1,000 mg by mouth daily.      No current facility-administered medications for this visit.      Past Medical History:  Diagnosis Date  . Arthritis   . Diabetes mellitus without complication ([place])    no meds yet  . Full dentures   . Hypertension   . Hypertension   . Stenosis of right middle cerebral artery 2011  . Wears glasses     Past Surgical  History:  Procedure Laterality Date  . CEREBRAL ANGIOGRAM    . COLONOSCOPY    . DIAGNOSTIC LAPAROSCOPY     fibroid  . MASS EXCISION Right 04/27/2014   Procedure: EXCISION MASS RIGHT RING FINGER DEBRIDEMENT PROXIMAL INTERPHALANGEAL JOINT RIGHT RING FINGER (PIP);  Surgeon: Wynonia Sours, MD;  Location: [place];  Service: Orthopedics;  Laterality: Right;    Social History   Socioeconomic History  . Marital status: Married    Spouse name: Not on file  . Number of children: 2  . Years of education: Not on file  . Highest education level: Not on file  Occupational History  . Not on file  Social Needs  . Financial resource strain: Not on file  . Food insecurity:    Worry: Not on file    Inability: Not on file  . Transportation needs:    Medical: Not on file    Non-medical: Not on file  Tobacco Use  . Smoking status: Never Smoker  . Smokeless tobacco: Never Used  Substance and Sexual Activity  . Alcohol use: Yes    Comment: Occasional  . Drug use: No  . Sexual activity: Not on file  Lifestyle  . Physical activity:    Days per week: Not on file    Minutes per session: Not  on file  . Stress: Not on file  Relationships  . Social connections:    Talks on phone: Not on file    Gets together: Not on file    Attends religious service: Not on file    Active member of club or organization: Not on file    Attends meetings of clubs or organizations: Not on file    Relationship status: Not on file  . Intimate partner violence:    Fear of current or ex partner: Not on file    Emotionally abused: Not on file    Physically abused: Not on file    Forced sexual activity: Not on file  Other Topics Concern  . Not on file  Social History Narrative  . Not on file    Family History  Problem Relation Age of Onset  . Dementia Mother   . Hypertension Mother   . Cancer Father   . Obesity Sister     ROS: no fevers or chills, productive cough, hemoptysis, dysphasia,  odynophagia, melena, hematochezia, dysuria, hematuria, rash, seizure activity, orthopnea, PND, pedal edema, claudication. Remaining systems are negative.  Physical Exam: Well-developed well-nourished in no acute distress.  Skin is warm and dry.  HEENT is normal.  Neck is supple.  Chest is clear to auscultation with normal expansion.  Cardiovascular exam is regular rate and rhythm.  Abdominal exam nontender or distended. No masses palpated. Extremities show no edema. neuro grossly intact  ECG- personally reviewed  A/P  1  Kirk Ruths, MD

## 2019-01-15 ENCOUNTER — Ambulatory Visit: Payer: Medicare Other | Admitting: Cardiology

## 2019-01-15 DIAGNOSIS — Z6841 Body Mass Index (BMI) 40.0 and over, adult: Secondary | ICD-10-CM | POA: Diagnosis not present

## 2019-01-15 DIAGNOSIS — R0981 Nasal congestion: Secondary | ICD-10-CM | POA: Diagnosis not present

## 2019-01-15 DIAGNOSIS — J019 Acute sinusitis, unspecified: Secondary | ICD-10-CM | POA: Diagnosis not present

## 2019-01-15 DIAGNOSIS — J309 Allergic rhinitis, unspecified: Secondary | ICD-10-CM | POA: Diagnosis not present

## 2019-01-27 ENCOUNTER — Other Ambulatory Visit: Payer: Self-pay | Admitting: Cardiology

## 2019-03-04 ENCOUNTER — Other Ambulatory Visit: Payer: Self-pay | Admitting: Internal Medicine

## 2019-03-04 DIAGNOSIS — I4891 Unspecified atrial fibrillation: Secondary | ICD-10-CM

## 2019-03-19 DIAGNOSIS — Z20828 Contact with and (suspected) exposure to other viral communicable diseases: Secondary | ICD-10-CM | POA: Diagnosis not present

## 2019-05-19 ENCOUNTER — Other Ambulatory Visit: Payer: Self-pay | Admitting: Cardiology

## 2019-06-11 NOTE — Progress Notes (Signed)
HPI: FU atrial fibrillation and dyspnea. Chest x-ray November 2017 showed CHF with mild interstitial edema and small pleural effusions.Patient states she was told 20 years ago that she may have had a heart attack. This was in [place]. No records available. Echocardiogram December [id number] normal LV systolic function, mild left atrial enlargement. Nuclear study December 2017 showed ejection fraction 51% and no ischemia or infarction.At last ov, pt noted to be in atrial fibrillation and she elected not to pursue cardioversion.Since last seenthe patient denies any dyspnea on exertion, orthopnea, PND, pedal edema, palpitations, syncope or chest pain.   Current Outpatient Medications  Medication Sig Dispense Refill  . amLODipine (NORVASC) 10 MG tablet Take 1 tablet (10 mg total) by mouth daily. KEEP OV. 90 tablet 0  . apixaban (ELIQUIS) 5 MG TABS tablet Take 1 tablet (5 mg total) by mouth 2 (two) times daily. 30 tablet 1  . carvedilol (COREG) 25 MG tablet Take 0.5 tablets (12.5 mg total) by mouth daily. 30 tablet 2  . clopidogrel (PLAVIX) 75 MG tablet Take 75 mg by mouth daily.    Marland Kitchen doxazosin (CARDURA) 8 MG tablet Take 8 mg by mouth daily.     . DULERA 200-5 MCG/ACT AERO Inhale 2 puffs into the lungs as needed.     Marland Kitchen ELIQUIS 5 MG TABS tablet TAKE 1 TABLET TWICE A DAY 180 tablet 1  . FLUZONE HIGH-DOSE 0.5 ML SUSY TO BE ADMINISTERED BY PHARMACIST FOR IMMUNIZATION  0  . furosemide (LASIX) 20 MG tablet Take 1 tablet by mouth daily.    Marland Kitchen losartan-hydrochlorothiazide (HYZAAR) 100-25 MG per tablet Take 1 tablet by mouth daily.    . Turmeric Curcumin 500 MG CAPS Take 1,000 mg by mouth daily.      No current facility-administered medications for this visit.      Past Medical History:  Diagnosis Date  . Arthritis   . Diabetes mellitus without complication ([place])    no meds yet  . Full dentures   . Hypertension   . Hypertension   . Stenosis of right middle cerebral artery 2011  .  Wears glasses     Past Surgical History:  Procedure Laterality Date  . CEREBRAL ANGIOGRAM    . COLONOSCOPY    . DIAGNOSTIC LAPAROSCOPY     fibroid  . MASS EXCISION Right 04/27/2014   Procedure: EXCISION MASS RIGHT RING FINGER DEBRIDEMENT PROXIMAL INTERPHALANGEAL JOINT RIGHT RING FINGER (PIP);  Surgeon: Wynonia Sours, MD;  Location: [place];  Service: Orthopedics;  Laterality: Right;    Social History   Socioeconomic History  . Marital status: Married    Spouse name: Not on file  . Number of children: 2  . Years of education: Not on file  . Highest education level: Not on file  Occupational History  . Not on file  Social Needs  . Financial resource strain: Not on file  . Food insecurity    Worry: Not on file    Inability: Not on file  . Transportation needs    Medical: Not on file    Non-medical: Not on file  Tobacco Use  . Smoking status: Never Smoker  . Smokeless tobacco: Never Used  Substance and Sexual Activity  . Alcohol use: Yes    Comment: Occasional  . Drug use: No  . Sexual activity: Not on file  Lifestyle  . Physical activity    Days per week: Not on file    Minutes  per session: Not on file  . Stress: Not on file  Relationships  . Social Herbalist on phone: Not on file    Gets together: Not on file    Attends religious service: Not on file    Active member of club or organization: Not on file    Attends meetings of clubs or organizations: Not on file    Relationship status: Not on file  . Intimate partner violence    Fear of current or ex partner: Not on file    Emotionally abused: Not on file    Physically abused: Not on file    Forced sexual activity: Not on file  Other Topics Concern  . Not on file  Social History Narrative  . Not on file    Family History  Problem Relation Age of Onset  . Dementia Mother   . Hypertension Mother   . Cancer Father   . Obesity Sister     ROS: no fevers or chills, productive  cough, hemoptysis, dysphasia, odynophagia, melena, hematochezia, dysuria, hematuria, rash, seizure activity, orthopnea, PND, pedal edema, claudication. Remaining systems are negative.  Physical Exam: Well-developed obese in no acute distress.  Skin is warm and dry.  HEENT is normal.  Neck is supple.  Chest is clear to auscultation with normal expansion.  Cardiovascular exam is irregular Abdominal exam nontender or distended. No masses palpated. Extremities show no edema. neuro grossly intact  ECG- Atrial fibrillation; [facility]; personally reviewed  A/P  1 permanent atrial fibrillation-patient declined cardioversion previously.  Continue carvedilol for rate control.  Continue apixaban.  Check hemoglobin and renal function.  2 hypertension-patient's blood pressure is controlled today.  Continue present medical regimen and follow.  3 history of right middle cerebral artery stenosis-continue Plavix.  Kirk Ruths, MD

## 2019-06-16 ENCOUNTER — Encounter: Payer: Self-pay | Admitting: Cardiology

## 2019-06-16 ENCOUNTER — Other Ambulatory Visit: Payer: Self-pay

## 2019-06-16 ENCOUNTER — Ambulatory Visit (INDEPENDENT_AMBULATORY_CARE_PROVIDER_SITE_OTHER): Payer: Medicare Other | Admitting: Cardiology

## 2019-06-16 VITALS — BP 130/74 | HR 74 | Temp 97.8°F | Ht 65.0 in | Wt 256.0 lb

## 2019-06-16 DIAGNOSIS — I10 Essential (primary) hypertension: Secondary | ICD-10-CM

## 2019-06-16 DIAGNOSIS — I4819 Other persistent atrial fibrillation: Secondary | ICD-10-CM

## 2019-06-16 DIAGNOSIS — I6601 Occlusion and stenosis of right middle cerebral artery: Secondary | ICD-10-CM

## 2019-06-16 LAB — CBC
Hematocrit: 41.8 % (ref 34.0–46.6)
Hemoglobin: 13.5 g/dL (ref 11.1–15.9)
MCH: 29 pg (ref 26.6–33.0)
MCHC: 32.3 g/dL (ref 31.5–35.7)
MCV: 90 fL (ref 79–97)
Platelets: 220 10*3/uL (ref 150–450)
RBC: 4.66 x10E6/uL (ref 3.77–5.28)
RDW: 13.7 % (ref 11.7–15.4)
WBC: 8.1 10*3/uL (ref 3.4–10.8)

## 2019-06-16 LAB — BASIC METABOLIC PANEL
BUN/Creatinine Ratio: 21 (ref 12–28)
BUN: 14 mg/dL (ref 8–27)
CO2: 24 mmol/L (ref 20–29)
Calcium: 9.5 mg/dL (ref 8.7–10.3)
Chloride: 104 mmol/L (ref 96–106)
Creatinine, Ser: 0.67 mg/dL (ref 0.57–1.00)
GFR calc Af Amer: 103 mL/min/{1.73_m2} (ref >59)
GFR calc non Af Amer: 89 mL/min/{1.73_m2} (ref >59)
Glucose: 111 mg/dL — ABNORMAL HIGH (ref 65–99)
Potassium: 4 mmol/L (ref 3.5–5.2)
Sodium: 141 mmol/L (ref 134–144)

## 2019-06-16 NOTE — Patient Instructions (Signed)
Medication Instructions:  NO CHANGE If you need a refill on your cardiac medications before your next appointment, please call your pharmacy.   Lab work: Your physician recommends that you HAVE LAB WORK TODAY If you have labs (blood work) drawn today and your tests are completely normal, you will receive your results only by: Marland Kitchen MyChart Message (if you have MyChart) OR . A paper copy in the mail If you have any lab test that is abnormal or we need to change your treatment, we will call you to review the results.  Follow-Up: At [facility], you and your health needs are our priority.  As part of our continuing mission to provide you with exceptional heart care, we have created designated Provider Care Teams.  These Care Teams include your primary Cardiologist (physician) and Advanced Practice Providers (APPs -  Physician Assistants and Nurse Practitioners) who all work together to provide you with the care you need, when you need it. You will need a follow up appointment in 12 months.  Please call our office 2 months in advance to schedule this appointment.  You may see Kirk Ruths MD or one of the following Advanced Practice Providers on your designated Care Team:   Kerin Ransom, PA-C Roby Lofts, [place] . Sande Rives, PA-C

## 2019-06-17 ENCOUNTER — Encounter: Payer: Self-pay | Admitting: *Deleted

## 2019-06-23 ENCOUNTER — Other Ambulatory Visit: Payer: Self-pay

## 2019-06-23 DIAGNOSIS — Z20822 Contact with and (suspected) exposure to covid-19: Secondary | ICD-10-CM

## 2019-06-25 LAB — NOVEL CORONAVIRUS, NAA: SARS-CoV-2, NAA: NOT DETECTED

## 2019-07-08 DIAGNOSIS — Z23 Encounter for immunization: Secondary | ICD-10-CM | POA: Diagnosis not present

## 2019-07-08 DIAGNOSIS — Z1389 Encounter for screening for other disorder: Secondary | ICD-10-CM | POA: Diagnosis not present

## 2019-07-08 DIAGNOSIS — I10 Essential (primary) hypertension: Secondary | ICD-10-CM | POA: Diagnosis not present

## 2019-07-08 DIAGNOSIS — M1990 Unspecified osteoarthritis, unspecified site: Secondary | ICD-10-CM | POA: Diagnosis not present

## 2019-07-08 DIAGNOSIS — D6869 Other thrombophilia: Secondary | ICD-10-CM | POA: Diagnosis not present

## 2019-07-08 DIAGNOSIS — I679 Cerebrovascular disease, unspecified: Secondary | ICD-10-CM | POA: Diagnosis not present

## 2019-07-08 DIAGNOSIS — I4819 Other persistent atrial fibrillation: Secondary | ICD-10-CM | POA: Diagnosis not present

## 2019-07-08 DIAGNOSIS — Z0000 Encounter for general adult medical examination without abnormal findings: Secondary | ICD-10-CM | POA: Diagnosis not present

## 2019-07-08 DIAGNOSIS — E041 Nontoxic single thyroid nodule: Secondary | ICD-10-CM | POA: Diagnosis not present

## 2019-07-08 DIAGNOSIS — R809 Proteinuria, unspecified: Secondary | ICD-10-CM | POA: Diagnosis not present

## 2019-07-08 DIAGNOSIS — I509 Heart failure, unspecified: Secondary | ICD-10-CM | POA: Diagnosis not present

## 2019-07-08 DIAGNOSIS — E1129 Type 2 diabetes mellitus with other diabetic kidney complication: Secondary | ICD-10-CM | POA: Diagnosis not present

## 2019-07-20 DIAGNOSIS — M25561 Pain in right knee: Secondary | ICD-10-CM | POA: Diagnosis not present

## 2019-07-20 DIAGNOSIS — M25562 Pain in left knee: Secondary | ICD-10-CM | POA: Diagnosis not present

## 2019-07-28 ENCOUNTER — Telehealth: Payer: Self-pay | Admitting: Cardiology

## 2019-07-28 NOTE — Telephone Encounter (Signed)
Patient wanted to make sure she had the correct medications- list is updated Adding medication to list-Atorvastatin 10 mg daily and Turmeric and Osteo biflex.

## 2019-07-28 NOTE — Telephone Encounter (Signed)
New message   Patient wants to go over medication list and add a new medication. Please call.

## 2019-07-29 DIAGNOSIS — Z1231 Encounter for screening mammogram for malignant neoplasm of breast: Secondary | ICD-10-CM | POA: Diagnosis not present

## 2019-08-03 ENCOUNTER — Other Ambulatory Visit: Payer: Self-pay | Admitting: Cardiology

## 2019-08-03 DIAGNOSIS — M25562 Pain in left knee: Secondary | ICD-10-CM | POA: Diagnosis not present

## 2019-08-03 DIAGNOSIS — M25561 Pain in right knee: Secondary | ICD-10-CM | POA: Diagnosis not present

## 2019-08-05 DIAGNOSIS — Z7901 Long term (current) use of anticoagulants: Secondary | ICD-10-CM | POA: Diagnosis not present

## 2019-08-05 DIAGNOSIS — Z1211 Encounter for screening for malignant neoplasm of colon: Secondary | ICD-10-CM | POA: Diagnosis not present

## 2019-08-13 DIAGNOSIS — M25561 Pain in right knee: Secondary | ICD-10-CM | POA: Diagnosis not present

## 2019-08-13 DIAGNOSIS — M25562 Pain in left knee: Secondary | ICD-10-CM | POA: Diagnosis not present

## 2019-08-17 DIAGNOSIS — F419 Anxiety disorder, unspecified: Secondary | ICD-10-CM | POA: Diagnosis not present

## 2019-09-12 DIAGNOSIS — Z1212 Encounter for screening for malignant neoplasm of rectum: Secondary | ICD-10-CM | POA: Diagnosis not present

## 2019-09-12 DIAGNOSIS — Z1211 Encounter for screening for malignant neoplasm of colon: Secondary | ICD-10-CM | POA: Diagnosis not present

## 2019-09-13 ENCOUNTER — Other Ambulatory Visit: Payer: Self-pay | Admitting: Internal Medicine

## 2019-09-13 DIAGNOSIS — I4891 Unspecified atrial fibrillation: Secondary | ICD-10-CM

## 2019-09-15 DIAGNOSIS — F419 Anxiety disorder, unspecified: Secondary | ICD-10-CM | POA: Diagnosis not present

## 2019-10-05 DIAGNOSIS — F419 Anxiety disorder, unspecified: Secondary | ICD-10-CM | POA: Diagnosis not present

## 2019-11-16 DIAGNOSIS — H2513 Age-related nuclear cataract, bilateral: Secondary | ICD-10-CM | POA: Diagnosis not present

## 2019-11-16 DIAGNOSIS — H40033 Anatomical narrow angle, bilateral: Secondary | ICD-10-CM | POA: Diagnosis not present

## 2019-12-07 ENCOUNTER — Encounter (INDEPENDENT_AMBULATORY_CARE_PROVIDER_SITE_OTHER): Payer: Self-pay

## 2019-12-15 ENCOUNTER — Ambulatory Visit (INDEPENDENT_AMBULATORY_CARE_PROVIDER_SITE_OTHER): Payer: Medicare Other | Admitting: Family Medicine

## 2019-12-23 DIAGNOSIS — Z23 Encounter for immunization: Secondary | ICD-10-CM | POA: Diagnosis not present

## 2019-12-28 DIAGNOSIS — J069 Acute upper respiratory infection, unspecified: Secondary | ICD-10-CM | POA: Diagnosis not present

## 2019-12-29 ENCOUNTER — Other Ambulatory Visit (INDEPENDENT_AMBULATORY_CARE_PROVIDER_SITE_OTHER): Payer: Self-pay | Admitting: Family Medicine

## 2019-12-29 ENCOUNTER — Encounter (INDEPENDENT_AMBULATORY_CARE_PROVIDER_SITE_OTHER): Payer: Self-pay | Admitting: Family Medicine

## 2019-12-29 ENCOUNTER — Other Ambulatory Visit: Payer: Self-pay

## 2019-12-29 ENCOUNTER — Ambulatory Visit (INDEPENDENT_AMBULATORY_CARE_PROVIDER_SITE_OTHER): Payer: Medicare Other | Admitting: Family Medicine

## 2019-12-29 VITALS — BP 151/74 | HR 74 | Temp 98.0°F | Ht 65.0 in | Wt 259.0 lb

## 2019-12-29 DIAGNOSIS — R7303 Prediabetes: Secondary | ICD-10-CM | POA: Diagnosis not present

## 2019-12-29 DIAGNOSIS — R0602 Shortness of breath: Secondary | ICD-10-CM | POA: Diagnosis not present

## 2019-12-29 DIAGNOSIS — I10 Essential (primary) hypertension: Secondary | ICD-10-CM | POA: Diagnosis not present

## 2019-12-29 DIAGNOSIS — R5383 Other fatigue: Secondary | ICD-10-CM | POA: Diagnosis not present

## 2019-12-29 DIAGNOSIS — Z9189 Other specified personal risk factors, not elsewhere classified: Secondary | ICD-10-CM | POA: Diagnosis not present

## 2019-12-29 DIAGNOSIS — E559 Vitamin D deficiency, unspecified: Secondary | ICD-10-CM

## 2019-12-29 DIAGNOSIS — Z1331 Encounter for screening for depression: Secondary | ICD-10-CM | POA: Diagnosis not present

## 2019-12-29 DIAGNOSIS — E66813 Obesity, class 3: Secondary | ICD-10-CM

## 2019-12-29 DIAGNOSIS — Z0289 Encounter for other administrative examinations: Secondary | ICD-10-CM

## 2019-12-29 DIAGNOSIS — Z6841 Body Mass Index (BMI) 40.0 and over, adult: Secondary | ICD-10-CM | POA: Diagnosis not present

## 2019-12-29 NOTE — Progress Notes (Signed)
Chief Complaint:   OBESITY Cynthia Frost (MR# [id number]) is a 69 y.o. female who presents for evaluation and treatment of obesity and related comorbidities. Cynthia Frost was told about our clinic from her water aerobics friend. Cynthia Frost tries to exercise in the mornings. Current BMI is Body mass index is 43.1 kg/m.Marland Kitchen Cynthia Frost has been struggling with her weight for many years and has been unsuccessful in either losing weight, maintaining weight loss, or reaching her healthy weight goal.  Cynthia Frost is currently in the action stage of change and ready to dedicate time achieving and maintaining a healthier weight. Cynthia Frost is interested in becoming our patient and working on intensive lifestyle modifications including (but not limited to) diet and exercise for weight loss.  Cynthia Frost's habits were reviewed today and are as follows: she struggles with family and or coworkers weight loss sabotage, her desired weight loss is 79 pounds, she has been heavy most of her life, her heaviest weight ever was 271 pounds, she has significant food cravings issues, she snacks frequently in the evenings, she is frequently drinking liquids with calories, she frequently makes poor food choices, she frequently eats larger portions than normal and she struggles with emotional eating.  Cynthia Frost has a Premier shake at approximately [id number] AM, 2 slices of toast, 2 eggs, 2 [place] slices (feels full). For lunch she has chili (1 cup), or Lean Cuisine, or Atkins with extra veggies (feels full). Dinner consists of 2 [place] burgers and broccoli. After dinner, she has fruit.  Depression Screen Cynthia Frost's Food and Mood (modified PHQ-9) score was strongly positive. PHQ Score is 19  Depression screen PHQ 2/9 12/29/2019  Decreased Interest 3  Down, Depressed, Hopeless 3  PHQ - 2 Score 6  Altered sleeping 0  Tired, decreased energy 2  Change in appetite 3  Feeling bad or failure about yourself  3  Trouble concentrating 0  Moving  slowly or fidgety/restless 3  Suicidal thoughts 2  PHQ-9 Score 19  Difficult doing work/chores Extremely dIfficult   Subjective:   Other fatigue  Cynthia Frost admits to daytime somnolence and denies waking up still tired. Patent has a history of symptoms of daytime fatigue and hypertension. Cynthia Frost generally gets 8 hours of sleep per night, and states that she has generally restful sleep. Snoring is present. Apneic episodes are not present. Epworth Sleepiness Score is 4. EKG ordered today shows Atrial Fibrillation at 76 BPM.  Shortness of breath on exertion  Cynthia Frost notes increasing shortness of breath with exercising and seems to be worsening over time with weight gain. She notes getting out of breath sooner with activity than she used to. This has not gotten worse recently. Cynthia Frost denies shortness of breath at rest or orthopnea.  Prediabetes Cynthia Frost has a diagnosis of prediabetes and her blood sugars average in the 100's. Her last A1c was at 6.2 and there is no insulin level in Epic. She was informed this puts her at greater risk of developing diabetes. She continues to work on diet and exercise to decrease her risk of diabetes.  Essential hypertension Cynthia Frost's blood pressure is 151/74. She sees Dr. Stanford Breed at [facility].  BP Readings from Last 3 Encounters:  12/29/19 (!) 151/74  06/16/19 130/74  01/01/18 132/70   Lab Results  Component Value Date   CREATININE 0.67 06/16/2019   CREATININE 0.81 01/01/2018   CREATININE 0.75 05/29/2017   Vitamin D deficiency Cynthia Frost is currently taking 1,000 IU vit D daily. She admits fatigue and denies nausea, vomiting or muscle weakness.  At risk for deficient intake of food The patient is at a higher than average risk of deficient intake of food due to current food recall.  Assessment/Plan:   Other fatigue  Cynthia Frost does feel that her weight is causing her energy to be lower than it should be. Fatigue may be related to obesity,  depression or many other causes. Labs and EKG will be ordered, and in the meanwhile, Cynthia Frost will focus on self care including making healthy food choices, increasing physical activity and focusing on stress reduction.  Shortness of breath on exertion Cynthia Frost does feel that she gets out of breath more easily that she used to when she exercises. Cynthia Frost's shortness of breath appears to be obesity related and exercise induced. She has agreed to work on weight loss and gradually increase exercise to treat her exercise induced shortness of breath. Labs and indirect calorimetry will be ordered today and we will continue to monitor closely.  Prediabetes  Cynthia Frost will begin to work on weight loss, exercise, and decreasing simple carbohydrates to help decrease the risk of diabetes. We will check Hgb A1c and insulin level today.  Essential hypertension Cynthia Frost is working on healthy weight loss and exercise to improve blood pressure control. We will order EKG and CMP today. We will watch for signs of hypotension as she continues her lifestyle modifications.  Vitamin D deficiency  Low Vitamin D level contributes to fatigue and are associated with obesity, breast, and colon cancer. Cynthia Frost will continue OTC Vitamin D 1,000 IU daily and we will check vitamin D level today. She will follow-up for routine testing of Vitamin D, at least 2-3 times per year to avoid over-replacement.  Depression screening Cynthia Frost had a positive depression screening. Depression is commonly associated with obesity and often results in emotional eating behaviors. We will monitor this closely and work on CBT to help improve the non-hunger eating patterns. Referral to Psychology may be required if no improvement is seen as she continues in our clinic.  At risk for deficient intake of food Cynthia Frost was given approximately 15 minutes of deficit intake of food prevention counseling today. Cynthia Frost is at risk for eating too few  calories based on current food recall. She was encouraged to focus on meeting caloric and protein goals according to her recommended meal plan.   Class 3 severe obesity with serious comorbidity and body mass index (BMI) of 40.0 to 44.9 in adult, unspecified obesity type (HCC) Cynthia Frost is currently in the action stage of change and her goal is to continue with weight loss efforts. I recommend Cynthia Frost begin the structured treatment plan as follows:  She has agreed to the Category 3 Plan.  Behavioral modification strategies: increasing lean protein intake, increasing vegetables, meal planning and cooking strategies, keeping healthy foods in the home and dealing with family or coworker sabotage.  She was informed of the importance of frequent follow-up visits to maximize her success with intensive lifestyle modifications for her multiple health conditions. She was informed we would discuss her lab results at her next visit unless there is a critical issue that needs to be addressed sooner. Cynthia Frost agreed to keep her next visit at the agreed upon time to discuss these results.  Objective:   Blood pressure (!) 151/74, pulse 74, temperature 98 F (36.7 C), temperature source Oral, height 5\' 5"  (1.651 m), weight 259 lb (117.5 kg), SpO2 96 %. Body mass index is 43.1 kg/m.  EKG: Normal sinus rhythm, rate 76 BPM.  Indirect Calorimeter completed  today shows a VO2 of 262 and a REE of 1827.  Her calculated basal metabolic rate is [id number] thus her basal metabolic rate is better than expected.  General: Cooperative, alert, well developed, in no acute distress. HEENT: Conjunctivae and lids unremarkable. Cardiovascular: Regular rhythm.  Lungs: Normal work of breathing. Neurologic: No focal deficits.   Lab Results  Component Value Date   CREATININE 0.67 06/16/2019   BUN 14 06/16/2019   NA 141 06/16/2019   K 4.0 06/16/2019   CL 104 06/16/2019   CO2 24 06/16/2019   No results found for: ALT, AST, GGT,  ALKPHOS, BILITOT No results found for: HGBA1C No results found for: INSULIN Lab Results  Component Value Date   TSH 0.80 10/24/2016   No results found for: CHOL, HDL, LDLCALC, LDLDIRECT, TRIG, CHOLHDL Lab Results  Component Value Date   WBC 8.1 06/16/2019   HGB 13.5 06/16/2019   HCT 41.8 06/16/2019   MCV 90 06/16/2019   PLT 220 06/16/2019   No results found for: IRON, TIBC, FERRITIN  Obesity Behavioral Intervention Visit Documentation for Insurance:   Approximately 15 minutes were spent on the discussion below.  ASK: We discussed the diagnosis of obesity with Cynthia Frost today and Cynthia Frost agreed to give [name] permission to discuss obesity behavioral modification therapy today.  ASSESS: Cynthia Frost has the diagnosis of obesity and her BMI today is 43.1. Cynthia Frost is in the action stage of change.   ADVISE: Bradlie was educated on the multiple health risks of obesity as well as the benefit of weight loss to improve her health. She was advised of the need for long term treatment and the importance of lifestyle modifications to improve her current health and to decrease her risk of future health problems.  AGREE: Multiple dietary modification options and treatment options were discussed and Lelania agreed to follow the recommendations documented in the above note.  ARRANGE: Cynthia Frost was educated on the importance of frequent visits to treat obesity as outlined per CMS and USPSTF guidelines and agreed to schedule her next follow up appointment today.  Attestation Statements:   This is the patient's first visit at [facility]. The patient's NEW PATIENT PACKET was reviewed at length. Included in the packet: current and past health history, medications, allergies, ROS, gynecologic history (women only), surgical history, family history, social history, weight history, weight loss surgery history (for those that have had weight loss surgery), nutritional evaluation, mood and  food questionnaire, PHQ9, Epworth questionnaire, sleep habits questionnaire, patient life and health improvement goals questionnaire. These will all be scanned into the patient's chart under media.   During the visit, I independently reviewed the patient's EKG, bioimpedance scale results, and indirect calorimeter results. I used this information to tailor a meal plan for the patient that will help her to lose weight and will improve her obesity-related conditions going forward. I performed a medically necessary appropriate examination and/or evaluation. I discussed the assessment and treatment plan with the patient. The patient was provided an opportunity to ask questions and all were answered. The patient agreed with the plan and demonstrated an understanding of the instructions. Labs were ordered at this visit and will be reviewed at the next visit unless more critical results need to be addressed immediately.Clinical information was updated and documented in the EMR.   Time spent on visit including pre-visit chart review and post-visit care was 50 minutes.   A separate 15 minutes was spent on risk counseling (see above).    I,  Doreene Nest, am acting as transcriptionist for Eber Jones, MD.  I have reviewed the above documentation for accuracy and completeness, and I agree with the above. - Ilene Qua, MD

## 2019-12-30 LAB — COMPREHENSIVE METABOLIC PANEL
ALT: 18 IU/L (ref 0–32)
AST: 19 IU/L (ref 0–40)
Albumin/Globulin Ratio: 1.2 (ref 1.2–2.2)
Albumin: 4.1 g/dL (ref 3.7–4.7)
Alkaline Phosphatase: 90 IU/L (ref 39–117)
BUN/Creatinine Ratio: 15 (ref 12–28)
BUN: 11 mg/dL (ref 8–27)
Bilirubin Total: 0.8 mg/dL (ref 0.0–1.2)
CO2: 25 mmol/L (ref 20–29)
Calcium: 9.2 mg/dL (ref 8.7–10.3)
Chloride: 97 mmol/L (ref 96–106)
Creatinine, Ser: 0.75 mg/dL (ref 0.57–1.00)
GFR calc Af Amer: 93 mL/min/{1.73_m2} (ref >59)
GFR calc non Af Amer: 80 mL/min/{1.73_m2} (ref >59)
Globulin, Total: 3.3 g/dL (ref 1.5–4.5)
Glucose: 106 mg/dL — ABNORMAL HIGH (ref 65–99)
Potassium: 3.9 mmol/L (ref 3.5–5.2)
Sodium: 137 mmol/L (ref 134–144)
Total Protein: 7.4 g/dL (ref 6.0–8.5)

## 2019-12-30 LAB — CBC WITH DIFFERENTIAL/PLATELET
Basophils Absolute: 0.1 10*3/uL (ref 0.0–0.2)
Basos: 1 %
EOS (ABSOLUTE): 0.2 10*3/uL (ref 0.0–0.4)
Eos: 2 %
Hematocrit: 42.3 % (ref 34.0–46.6)
Hemoglobin: 13.5 g/dL (ref 11.1–15.9)
Immature Grans (Abs): 0 10*3/uL (ref 0.0–0.1)
Immature Granulocytes: 0 %
Lymphocytes Absolute: 1.6 10*3/uL (ref 0.7–3.1)
Lymphs: 23 %
MCH: 28.7 pg (ref 26.6–33.0)
MCHC: 31.9 g/dL (ref 31.5–35.7)
MCV: 90 fL (ref 79–97)
Monocytes Absolute: 0.7 10*3/uL (ref 0.1–0.9)
Monocytes: 9 %
Neutrophils Absolute: 4.5 10*3/uL (ref 1.4–7.0)
Neutrophils: 65 %
Platelets: 220 10*3/uL (ref 150–450)
RBC: 4.7 x10E6/uL (ref 3.77–5.28)
RDW: 13.2 % (ref 11.7–15.4)
WBC: 7 10*3/uL (ref 3.4–10.8)

## 2019-12-30 LAB — LIPID PANEL WITH LDL/HDL RATIO
Cholesterol, Total: 136 mg/dL (ref 100–199)
HDL: 58 mg/dL (ref >39)
LDL Chol Calc (NIH): 59 mg/dL (ref 0–99)
LDL/HDL Ratio: 1 ratio (ref 0.0–3.2)
Triglycerides: 105 mg/dL (ref 0–149)
VLDL Cholesterol Cal: 19 mg/dL (ref 5–40)

## 2019-12-30 LAB — T4, FREE: Free T4: 1.54 ng/dL (ref 0.82–1.77)

## 2019-12-30 LAB — INSULIN, RANDOM: INSULIN: 19.4 u[IU]/mL (ref 2.6–24.9)

## 2019-12-30 LAB — T3: T3, Total: 88 ng/dL (ref 71–180)

## 2019-12-30 LAB — TSH: TSH: 1.15 u[IU]/mL (ref 0.450–4.500)

## 2019-12-30 LAB — HEMOGLOBIN A1C
Est. average glucose Bld gHb Est-mCnc: 154 mg/dL
Hgb A1c MFr Bld: 7 % — ABNORMAL HIGH (ref 4.8–5.6)

## 2019-12-30 LAB — VITAMIN D 25 HYDROXY (VIT D DEFICIENCY, FRACTURES): Vit D, 25-Hydroxy: 46.7 ng/mL (ref 30.0–100.0)

## 2020-01-07 DIAGNOSIS — E1139 Type 2 diabetes mellitus with other diabetic ophthalmic complication: Secondary | ICD-10-CM | POA: Diagnosis not present

## 2020-01-07 DIAGNOSIS — I679 Cerebrovascular disease, unspecified: Secondary | ICD-10-CM | POA: Diagnosis not present

## 2020-01-07 DIAGNOSIS — I509 Heart failure, unspecified: Secondary | ICD-10-CM | POA: Diagnosis not present

## 2020-01-07 DIAGNOSIS — D6869 Other thrombophilia: Secondary | ICD-10-CM | POA: Diagnosis not present

## 2020-01-07 DIAGNOSIS — E113299 Type 2 diabetes mellitus with mild nonproliferative diabetic retinopathy without macular edema, unspecified eye: Secondary | ICD-10-CM | POA: Diagnosis not present

## 2020-01-07 DIAGNOSIS — F419 Anxiety disorder, unspecified: Secondary | ICD-10-CM | POA: Diagnosis not present

## 2020-01-07 DIAGNOSIS — R809 Proteinuria, unspecified: Secondary | ICD-10-CM | POA: Diagnosis not present

## 2020-01-07 DIAGNOSIS — E041 Nontoxic single thyroid nodule: Secondary | ICD-10-CM | POA: Diagnosis not present

## 2020-01-07 DIAGNOSIS — E1129 Type 2 diabetes mellitus with other diabetic kidney complication: Secondary | ICD-10-CM | POA: Diagnosis not present

## 2020-01-07 DIAGNOSIS — I10 Essential (primary) hypertension: Secondary | ICD-10-CM | POA: Diagnosis not present

## 2020-01-07 DIAGNOSIS — I4811 Longstanding persistent atrial fibrillation: Secondary | ICD-10-CM | POA: Diagnosis not present

## 2020-01-12 ENCOUNTER — Encounter (INDEPENDENT_AMBULATORY_CARE_PROVIDER_SITE_OTHER): Payer: Self-pay | Admitting: Family Medicine

## 2020-01-12 ENCOUNTER — Ambulatory Visit (INDEPENDENT_AMBULATORY_CARE_PROVIDER_SITE_OTHER): Payer: Medicare Other | Admitting: Family Medicine

## 2020-01-12 ENCOUNTER — Other Ambulatory Visit: Payer: Self-pay

## 2020-01-12 VITALS — BP 153/79 | HR 75 | Temp 97.8°F | Ht 65.0 in | Wt 262.0 lb

## 2020-01-12 DIAGNOSIS — I10 Essential (primary) hypertension: Secondary | ICD-10-CM | POA: Diagnosis not present

## 2020-01-12 DIAGNOSIS — E1159 Type 2 diabetes mellitus with other circulatory complications: Secondary | ICD-10-CM | POA: Diagnosis not present

## 2020-01-12 DIAGNOSIS — E559 Vitamin D deficiency, unspecified: Secondary | ICD-10-CM | POA: Diagnosis not present

## 2020-01-12 DIAGNOSIS — E66813 Obesity, class 3: Secondary | ICD-10-CM

## 2020-01-12 DIAGNOSIS — E1165 Type 2 diabetes mellitus with hyperglycemia: Secondary | ICD-10-CM

## 2020-01-12 DIAGNOSIS — F418 Other specified anxiety disorders: Secondary | ICD-10-CM | POA: Diagnosis not present

## 2020-01-12 DIAGNOSIS — Z6841 Body Mass Index (BMI) 40.0 and over, adult: Secondary | ICD-10-CM | POA: Diagnosis not present

## 2020-01-12 DIAGNOSIS — I152 Hypertension secondary to endocrine disorders: Secondary | ICD-10-CM

## 2020-01-12 NOTE — Progress Notes (Signed)
Chief Complaint:   OBESITY Cynthia Frost is here to discuss her progress with her obesity treatment plan along with follow-up of her obesity related diagnoses. Cynthia Frost is on the Category 3 Plan and states she is following her eating plan approximately 50% of the time. Cynthia Frost states she is exercising for 0 minutes 0 times per week.  Today's visit was #: 2 Starting weight: 259 lbs Starting date: 12/29/2019 Today's weight: 262 lbs Today's date: 01/12/2020 Total lbs lost to date: 0 Total lbs lost since last in-office visit: 0  Interim History: Cynthia Frost finished up all food prior to starting the plan and then got started on duloxetine for depression, and it has been causing the patient to sleep 16 hours a day.  The patient is interested in having chili for lunch.  She says she has significant familial stress with her daughter, who is a paranoid schizophrenic.  Subjective:   1. Type 2 diabetes mellitus with hyperglycemia, without long-term current use of insulin ([place]) This is a new diagnosis for the patient.  Lab Results  Component Value Date   HGBA1C 7.0 (H) 12/29/2019   Lab Results  Component Value Date   LDLCALC 59 12/29/2019   CREATININE 0.75 12/29/2019   Lab Results  Component Value Date   INSULIN 19.4 12/29/2019   2. Vitamin D deficiency Cynthia Frost's Vitamin D level was 46.7 on 12/29/2019. She is currently taking vit D 1000 IU daily. She denies nausea, vomiting or muscle weakness.  3. Hypertension associated with type 2 diabetes mellitus ([place]) Review: taking medications as instructed, no medication side effects noted, no chest pain on exertion, no dyspnea on exertion, no swelling of ankles.  Her blood pressure is elevated at 153/79 today.  No chest pressure or headache.  BP Readings from Last 3 Encounters:  01/12/20 (!) 153/79  12/29/19 (!) 151/74  06/16/19 130/74   4. Depression with anxiety Cynthia Frost was started on duloxetine 30 mg daily.  Her symptoms improved but she  voices that the dose is too strong.  Assessment/Plan:   1. Type 2 diabetes mellitus with hyperglycemia, without long-term current use of insulin ([place]) Cynthia Frost would like to think about starting metformin and discuss at next appointment.  2. Vitamin D deficiency Cynthia Frost can continue 1000 IU daily.  Will recheck her labs in 3 months.  3. Hypertension associated with type 2 diabetes mellitus ([place]) Will follow-up on blood pressure at next appointment.  4. Depression with anxiety Cynthia Frost will follow-up with Dr. Marisue Humble.  5. Class 3 severe obesity with serious comorbidity and body mass index (BMI) of 40.0 to 44.9 in adult, unspecified obesity type (HCC) Cynthia Frost is currently in the action stage of change. As such, her goal is to continue with weight loss efforts. She has agreed to the Category 3 Plan.   Exercise goals: No exercise has been prescribed at this time.  Behavioral modification strategies: increasing lean protein intake, increasing vegetables, meal planning and cooking strategies, keeping healthy foods in the home and planning for success.  Cynthia Frost has agreed to follow-up with our clinic in 2 weeks. She was informed of the importance of frequent follow-up visits to maximize her success with intensive lifestyle modifications for her multiple health conditions.   Objective:   Blood pressure (!) 153/79, pulse 75, temperature 97.8 F (36.6 C), temperature source Oral, height 5\' 5"  (1.651 m), weight 262 lb (118.8 kg), SpO2 97 %. Body mass index is 43.6 kg/m.  General: Cooperative, alert, well developed, in no acute distress. HEENT:  Conjunctivae and lids unremarkable. Cardiovascular: Regular rhythm.  Lungs: Normal work of breathing. Neurologic: No focal deficits.   Lab Results  Component Value Date   CREATININE 0.75 12/29/2019   BUN 11 12/29/2019   NA 137 12/29/2019   K 3.9 12/29/2019   CL 97 12/29/2019   CO2 25 12/29/2019   Lab Results  Component Value Date   ALT  18 12/29/2019   AST 19 12/29/2019   ALKPHOS 90 12/29/2019   BILITOT 0.8 12/29/2019   Lab Results  Component Value Date   HGBA1C 7.0 (H) 12/29/2019   Lab Results  Component Value Date   INSULIN 19.4 12/29/2019   Lab Results  Component Value Date   TSH 1.150 12/29/2019   Lab Results  Component Value Date   CHOL 136 12/29/2019   HDL 58 12/29/2019   LDLCALC 59 12/29/2019   TRIG 105 12/29/2019   Lab Results  Component Value Date   WBC 7.0 12/29/2019   HGB 13.5 12/29/2019   HCT 42.3 12/29/2019   MCV 90 12/29/2019   PLT 220 12/29/2019   Attestation Statements:   Reviewed by clinician on day of visit: allergies, medications, problem list, medical history, surgical history, family history, social history, and previous encounter notes.  Time spent on visit including pre-visit chart review and post-visit care and charting was 30 minutes.   I, Water quality scientist, CMA, am acting as transcriptionist for [name], MD.  I have reviewed the above documentation for accuracy and completeness, and I agree with the above. - Ilene Qua, MD

## 2020-01-13 ENCOUNTER — Ambulatory Visit (INDEPENDENT_AMBULATORY_CARE_PROVIDER_SITE_OTHER): Payer: Medicare Other | Admitting: Licensed Clinical Social Worker

## 2020-01-13 ENCOUNTER — Encounter (INDEPENDENT_AMBULATORY_CARE_PROVIDER_SITE_OTHER): Payer: Self-pay | Admitting: Family Medicine

## 2020-01-13 DIAGNOSIS — Z638 Other specified problems related to primary support group: Secondary | ICD-10-CM

## 2020-01-13 DIAGNOSIS — F419 Anxiety disorder, unspecified: Secondary | ICD-10-CM

## 2020-01-13 DIAGNOSIS — F331 Major depressive disorder, recurrent, moderate: Secondary | ICD-10-CM | POA: Diagnosis not present

## 2020-01-13 MED ORDER — METFORMIN HCL 500 MG PO TABS: 500.0000 mg | ORAL_TABLET | Freq: Every day | ORAL | 0 refills | Status: DC

## 2020-01-13 NOTE — Telephone Encounter (Signed)
Please advise 
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
## 2020-01-26 DIAGNOSIS — Z23 Encounter for immunization: Secondary | ICD-10-CM | POA: Diagnosis not present

## 2020-02-02 ENCOUNTER — Ambulatory Visit (INDEPENDENT_AMBULATORY_CARE_PROVIDER_SITE_OTHER): Payer: Medicare Other | Admitting: Family Medicine

## 2020-02-03 ENCOUNTER — Encounter (INDEPENDENT_AMBULATORY_CARE_PROVIDER_SITE_OTHER): Payer: Self-pay | Admitting: Family Medicine

## 2020-02-08 DIAGNOSIS — F419 Anxiety disorder, unspecified: Secondary | ICD-10-CM | POA: Diagnosis not present

## 2020-02-10 ENCOUNTER — Ambulatory Visit (INDEPENDENT_AMBULATORY_CARE_PROVIDER_SITE_OTHER): Payer: Medicare Other | Admitting: Family Medicine

## 2020-02-10 ENCOUNTER — Encounter (INDEPENDENT_AMBULATORY_CARE_PROVIDER_SITE_OTHER): Payer: Self-pay | Admitting: Family Medicine

## 2020-02-10 ENCOUNTER — Other Ambulatory Visit: Payer: Self-pay

## 2020-02-10 VITALS — BP 163/77 | HR 77 | Temp 97.6°F | Ht 65.0 in | Wt 253.0 lb

## 2020-02-10 DIAGNOSIS — I10 Essential (primary) hypertension: Secondary | ICD-10-CM

## 2020-02-10 DIAGNOSIS — E1165 Type 2 diabetes mellitus with hyperglycemia: Secondary | ICD-10-CM

## 2020-02-10 DIAGNOSIS — Z6841 Body Mass Index (BMI) 40.0 and over, adult: Secondary | ICD-10-CM

## 2020-02-10 MED ORDER — METFORMIN HCL 500 MG PO TABS: 500.0000 mg | ORAL_TABLET | Freq: Every day | ORAL | 0 refills | Status: DC

## 2020-02-10 NOTE — Progress Notes (Signed)
Chief Complaint:   OBESITY Cynthia Frost is here to discuss her progress with her obesity treatment plan along with follow-up of her obesity related diagnoses. Cynthia Frost is on the Category 3 Plan and states she is following her eating plan approximately 80-85% of the time. Beckey states she is biking 10 minutes 7 times per week.  Today's visit was #: 3 Starting weight: 259 lbs Starting date: 12/29/2019 Today's weight: 253 lbs Today's date: 02/10/2020 Total lbs lost to date: 6 Total lbs lost since last in-office visit: 9  Interim History: Cynthia Frost had an emergency with her daughter and had to cancel her appointment at the last minute. She voices she learned she has to be careful with seasonings and salt content. She did go out to eat and tried to make good healthy choices. She is not doing dairy in the plan because it exacerbates her arthritis.  Subjective:   Essential hypertension. Blood pressure is elevated today and has been elevated on the last 2 appointments as well. She hasn't taken her medication. No chest pain, chest pressure, or headache.  BP Readings from Last 3 Encounters:  02/10/20 (!) 163/77  01/12/20 (!) 153/79  12/29/19 (!) 151/74   Lab Results  Component Value Date   CREATININE 0.75 12/29/2019   CREATININE 0.67 06/16/2019   CREATININE 0.81 01/01/2018   Type 2 diabetes mellitus with hyperglycemia, without long-term current use of insulin ([place]). Cynthia Frost is doing well on metformin and denies GI side effects. She reports feeling well taking it.  Lab Results  Component Value Date   HGBA1C 7.0 (H) 12/29/2019   Lab Results  Component Value Date   LDLCALC 59 12/29/2019   CREATININE 0.75 12/29/2019   Lab Results  Component Value Date   INSULIN 19.4 12/29/2019   Assessment/Plan:   Essential hypertension. Cynthia Frost is working on healthy weight loss and exercise to improve blood pressure control. We will watch for signs of hypotension as she continues her  lifestyle modifications. Will follow-up blood pressure at her next appointment.  Type 2 diabetes mellitus with hyperglycemia, without long-term current use of insulin ([place]). Good blood sugar control is important to decrease the likelihood of diabetic complications such as nephropathy, neuropathy, limb loss, blindness, coronary artery disease, and death. Intensive lifestyle modification including diet, exercise and weight loss are the first line of treatment for diabetes. Cynthia Frost was given a refill on her metFORMIN (GLUCOPHAGE) 500 MG tablet PO QAM #30 with 0 refills.  Class 3 severe obesity with serious comorbidity and body mass index (BMI) of 40.0 to 44.9 in adult, unspecified obesity type ([place]).  Cynthia Frost is currently in the action stage of change. As such, her goal is to continue with weight loss efforts. She has agreed to the Category 3 Plan with protein equivalents.   Exercise goals: Cynthia Frost will continue her current exercise regimen.  Behavioral modification strategies: increasing lean protein intake, increasing vegetables, meal planning and cooking strategies, keeping healthy foods in the home and planning for success.  Cynthia Frost has agreed to follow-up with our clinic in 2 weeks. She was informed of the importance of frequent follow-up visits to maximize her success with intensive lifestyle modifications for her multiple health conditions.   Objective:   Blood pressure (!) 163/77, pulse 77, temperature 97.6 F (36.4 C), temperature source Oral, height 5\' 5"  (1.651 m), weight 253 lb (114.8 kg), SpO2 95 %. Body mass index is 42.1 kg/m.  General: Cooperative, alert, well developed, in no acute distress. HEENT: Conjunctivae and  lids unremarkable. Cardiovascular: Regular rhythm.  Lungs: Normal work of breathing. Neurologic: No focal deficits.   Lab Results  Component Value Date   CREATININE 0.75 12/29/2019   BUN 11 12/29/2019   NA 137 12/29/2019   K 3.9 12/29/2019   CL 97  12/29/2019   CO2 25 12/29/2019   Lab Results  Component Value Date   ALT 18 12/29/2019   AST 19 12/29/2019   ALKPHOS 90 12/29/2019   BILITOT 0.8 12/29/2019   Lab Results  Component Value Date   HGBA1C 7.0 (H) 12/29/2019   Lab Results  Component Value Date   INSULIN 19.4 12/29/2019   Lab Results  Component Value Date   TSH 1.150 12/29/2019   Lab Results  Component Value Date   CHOL 136 12/29/2019   HDL 58 12/29/2019   LDLCALC 59 12/29/2019   TRIG 105 12/29/2019   Lab Results  Component Value Date   WBC 7.0 12/29/2019   HGB 13.5 12/29/2019   HCT 42.3 12/29/2019   MCV 90 12/29/2019   PLT 220 12/29/2019   No results found for: IRON, TIBC, FERRITIN  Obesity Behavioral Intervention Documentation for Insurance:   Approximately 15 minutes were spent on the discussion below.  ASK: We discussed the diagnosis of obesity with Cynthia Frost today and Cynthia Frost agreed to give Cynthia Frost permission to discuss obesity behavioral modification therapy today.  ASSESS: [name] has the diagnosis of obesity and her BMI today is 42.1. [name] is in the action stage of change.   ADVISE: Cynthia Frost was educated on the multiple health risks of obesity as well as the benefit of weight loss to improve her health. She was advised of the need for long term treatment and the importance of lifestyle modifications to improve her current health and to decrease her risk of future health problems.  AGREE: Multiple dietary modification options and treatment options were discussed and Cynthia Frost agreed to follow the recommendations documented in the above note.  ARRANGE: Cynthia Frost was educated on the importance of frequent visits to treat obesity as outlined per CMS and USPSTF guidelines and agreed to schedule her next follow up appointment today.  Attestation Statements:   Reviewed by clinician on day of visit: allergies, medications, problem list, medical history, surgical history, family history, social  history, and previous encounter notes.  I, Michaelene Song, am acting as transcriptionist for Cynthia Common, MD   I have reviewed the above documentation for accuracy and completeness, and I agree with the above. - Ilene Qua, MD

## 2020-02-14 ENCOUNTER — Other Ambulatory Visit (INDEPENDENT_AMBULATORY_CARE_PROVIDER_SITE_OTHER): Payer: Self-pay | Admitting: Family Medicine

## 2020-02-14 DIAGNOSIS — E1165 Type 2 diabetes mellitus with hyperglycemia: Secondary | ICD-10-CM

## 2020-03-01 ENCOUNTER — Ambulatory Visit (INDEPENDENT_AMBULATORY_CARE_PROVIDER_SITE_OTHER): Payer: Medicare Other | Admitting: Family Medicine

## 2020-03-01 ENCOUNTER — Encounter (INDEPENDENT_AMBULATORY_CARE_PROVIDER_SITE_OTHER): Payer: Self-pay | Admitting: Family Medicine

## 2020-03-01 ENCOUNTER — Other Ambulatory Visit: Payer: Self-pay

## 2020-03-01 VITALS — BP 156/74 | HR 74 | Temp 97.9°F | Ht 65.0 in | Wt 259.0 lb

## 2020-03-01 DIAGNOSIS — Z6841 Body Mass Index (BMI) 40.0 and over, adult: Secondary | ICD-10-CM | POA: Diagnosis not present

## 2020-03-01 DIAGNOSIS — F3289 Other specified depressive episodes: Secondary | ICD-10-CM | POA: Diagnosis not present

## 2020-03-01 DIAGNOSIS — I10 Essential (primary) hypertension: Secondary | ICD-10-CM | POA: Diagnosis not present

## 2020-03-02 NOTE — Progress Notes (Signed)
Chief Complaint:   OBESITY Cynthia Frost is here to discuss her progress with her obesity treatment plan along with follow-up of her obesity related diagnoses. Cynthia Frost is on the Category 3 Plan. Cynthia Frost states she is biking 10 minutes 3 times per week, exercising on the elliptical 5 minutes 3 times per week, and working with weights 5-10 minutes 1-2 times per week.  Today's visit was #: 4 Starting weight: 259 lbs Starting date: 12/29/2019 Today's weight: 259 lbs Today's date: 03/01/2020 Total lbs lost to date: 0 Total lbs lost since last in-office visit: 0  Interim History: Cynthia Frost's daughter has attempted suicide 2 times in the past 3 weeks and so Cynthia Frost voices she has been stress eating. She went shopping this a.m. for groceries. She does not anticipate obstacles in the next few weeks. According to the scale, she gained ~8 lbs of water.  Subjective:   Other depression, with emotional eating. Cynthia Frost is struggling with emotional eating and using food for comfort to the extent that it is negatively impacting her health. She has been working on behavior modification techniques to help reduce her emotional eating and has been somewhat successful. She shows no sign of suicidal or homicidal ideations. Cynthia Frost couldn't make her appointment with the therapist secondary to life stressors.  Essential hypertension. No chest pain, chest pressure, or headache. Cynthia Frost is on Hyzaar, Coreg, and Norvasc. She states she did take her blood pressure medications today.  BP Readings from Last 3 Encounters:  03/01/20 (!) 156/74  02/10/20 (!) 163/77  01/12/20 (!) 153/79   Lab Results  Component Value Date   CREATININE 0.75 12/29/2019   CREATININE 0.67 06/16/2019   CREATININE 0.81 01/01/2018   Assessment/Plan:   Other depression, with emotional eating. Behavior modification techniques were discussed today to help Cynthia Frost deal with her emotional/non-hunger eating behaviors.  Orders and  follow up as documented in patient record. Cynthia Frost will continue Cymbalta as directed.  Essential hypertension. Cynthia Frost is working on healthy weight loss and exercise to improve blood pressure control. We will watch for signs of hypotension as she continues her lifestyle modifications. Will have follow-up blood pressure at her next appointment.  Class 3 severe obesity with serious comorbidity and body mass index (BMI) of 40.0 to 44.9 in adult, unspecified obesity type ([place]).  Cynthia Frost is currently in the action stage of change. As such, her goal is to continue with weight loss efforts. She has agreed to the Category 3 Plan.   Exercise goals: Cynthia Frost will continue her current exercise regimen.  Behavioral modification strategies: increasing lean protein intake, meal planning and cooking strategies, keeping healthy foods in the home and planning for success.  Cynthia Frost has agreed to follow-up with our clinic in 2 weeks. She was informed of the importance of frequent follow-up visits to maximize her success with intensive lifestyle modifications for her multiple health conditions.   Objective:   Blood pressure (!) 156/74, pulse 74, temperature 97.9 F (36.6 C), temperature source Oral, height 5\' 5"  (1.651 m), weight 259 lb (117.5 kg), SpO2 96 %. Body mass index is 43.1 kg/m.  General: Cooperative, alert, well developed, in no acute distress. HEENT: Conjunctivae and lids unremarkable. Cardiovascular: Regular rhythm.  Lungs: Normal work of breathing. Neurologic: No focal deficits.   Lab Results  Component Value Date   CREATININE 0.75 12/29/2019   BUN 11 12/29/2019   NA 137 12/29/2019   K 3.9 12/29/2019   CL 97 12/29/2019   CO2 25 12/29/2019   Lab  Results  Component Value Date   ALT 18 12/29/2019   AST 19 12/29/2019   ALKPHOS 90 12/29/2019   BILITOT 0.8 12/29/2019   Lab Results  Component Value Date   HGBA1C 7.0 (H) 12/29/2019   Lab Results  Component Value Date   INSULIN  19.4 12/29/2019   Lab Results  Component Value Date   TSH 1.150 12/29/2019   Lab Results  Component Value Date   CHOL 136 12/29/2019   HDL 58 12/29/2019   LDLCALC 59 12/29/2019   TRIG 105 12/29/2019   Lab Results  Component Value Date   WBC 7.0 12/29/2019   HGB 13.5 12/29/2019   HCT 42.3 12/29/2019   MCV 90 12/29/2019   PLT 220 12/29/2019   No results found for: IRON, TIBC, FERRITIN  Attestation Statements:   Reviewed by clinician on day of visit: allergies, medications, problem list, medical history, surgical history, family history, social history, and previous encounter notes.  Time spent on visit including pre-visit chart review and post-visit charting and care was 16 minutes.   I, Michaelene Song, am acting as transcriptionist for [name], MD   I have reviewed the above documentation for accuracy and completeness, and I agree with the above. - Ilene Qua, MD

## 2020-03-16 ENCOUNTER — Ambulatory Visit (INDEPENDENT_AMBULATORY_CARE_PROVIDER_SITE_OTHER): Payer: Medicare Other | Admitting: Family Medicine

## 2020-03-21 ENCOUNTER — Other Ambulatory Visit (INDEPENDENT_AMBULATORY_CARE_PROVIDER_SITE_OTHER): Payer: Self-pay | Admitting: Family Medicine

## 2020-03-21 DIAGNOSIS — J209 Acute bronchitis, unspecified: Secondary | ICD-10-CM | POA: Diagnosis not present

## 2020-03-21 DIAGNOSIS — J302 Other seasonal allergic rhinitis: Secondary | ICD-10-CM | POA: Diagnosis not present

## 2020-03-21 DIAGNOSIS — Z6841 Body Mass Index (BMI) 40.0 and over, adult: Secondary | ICD-10-CM | POA: Diagnosis not present

## 2020-03-21 DIAGNOSIS — E1165 Type 2 diabetes mellitus with hyperglycemia: Secondary | ICD-10-CM

## 2020-03-21 DIAGNOSIS — I10 Essential (primary) hypertension: Secondary | ICD-10-CM | POA: Diagnosis not present

## 2020-03-23 ENCOUNTER — Other Ambulatory Visit (INDEPENDENT_AMBULATORY_CARE_PROVIDER_SITE_OTHER): Payer: Self-pay

## 2020-03-23 ENCOUNTER — Encounter (INDEPENDENT_AMBULATORY_CARE_PROVIDER_SITE_OTHER): Payer: Self-pay

## 2020-03-23 DIAGNOSIS — E1165 Type 2 diabetes mellitus with hyperglycemia: Secondary | ICD-10-CM

## 2020-03-23 MED ORDER — METFORMIN HCL 500 MG PO TABS: 500.0000 mg | ORAL_TABLET | Freq: Every day | ORAL | 0 refills | Status: DC

## 2020-04-11 ENCOUNTER — Telehealth (HOSPITAL_COMMUNITY): Payer: Self-pay | Admitting: Licensed Clinical Social Worker

## 2020-04-11 NOTE — Telephone Encounter (Signed)
Attempted contact w/ pt to inform her of this clinician's resignation effective 04/28/20 and to discuss transition plan for services moving forward. Left message asking for return of contact.

## 2020-04-24 ENCOUNTER — Ambulatory Visit (INDEPENDENT_AMBULATORY_CARE_PROVIDER_SITE_OTHER): Payer: Medicare Other | Admitting: Psychiatry

## 2020-04-24 ENCOUNTER — Other Ambulatory Visit: Payer: Self-pay

## 2020-04-24 DIAGNOSIS — F331 Major depressive disorder, recurrent, moderate: Secondary | ICD-10-CM

## 2020-04-24 DIAGNOSIS — F419 Anxiety disorder, unspecified: Secondary | ICD-10-CM | POA: Diagnosis not present

## 2020-04-24 NOTE — Progress Notes (Signed)
Virtual Visit via Telephone Note  I connected with Cynthia Frost on 04/24/20 at  2:30 PM EDT by telephone and verified that I am speaking with the correct person using two identifiers.  Location: Patient: Patient Home Provider: Home Office   I discussed the limitations of evaluation and management by telemedicine and the availability of in person appointments. The patient expressed understanding and agreed to proceed.  History of Present Illness: MDD   Treatment Plan Goals: 1) Cynthia Frost would like to change core belief of "I don't trust people" in connection with childhood traumas. 2) Cynthia Frost would like to better connect with her self-concept in order to improve self-esteem and self-acceptance.   Observations/Objective: Counselor met with Client for individual therapy via Webex. Counselor assessed MH symptoms and progress on treatment plan goals, with patient reported that she has made significant progress on goals related to family stressors, initially developed with previous Counselor at initiation of services. Client presents with moderate depression and moderate anxiety. Client denied suicidal ideation or self-harm behaviors.   Client shared that her core issue is that she does not trust people, which impacts her self-concept and connections with others. She noted isolating self from others and lack of a support system. Counselor and Client worked to reestablish treatment plan goals. Counselor gathered information on historical context to trust issues. Counselor discussed trauma informed treatment options. Client would like to meet 1-2 times per month to address treatment plan goals (see above).   Assessment and Plan: Counselor will continue to meet with patient to address treatment plan goals. Patient will continue to follow recommendations of providers and implement skills learned in session.  Follow Up Instructions: Counselor will send information for next session via Webex.     The patient was advised to call back or seek an in-person evaluation if the symptoms worsen or if the condition fails to improve as anticipated.  I provided 55 minutes of non-face-to-face time during this encounter.   Lise Auer, LCSW

## 2020-04-25 DIAGNOSIS — I4891 Unspecified atrial fibrillation: Secondary | ICD-10-CM | POA: Diagnosis not present

## 2020-04-25 DIAGNOSIS — I10 Essential (primary) hypertension: Secondary | ICD-10-CM | POA: Diagnosis not present

## 2020-04-25 DIAGNOSIS — Z8249 Family history of ischemic heart disease and other diseases of the circulatory system: Secondary | ICD-10-CM | POA: Diagnosis not present

## 2020-04-28 ENCOUNTER — Ambulatory Visit (HOSPITAL_COMMUNITY): Payer: Medicare Other | Admitting: Licensed Clinical Social Worker

## 2020-05-02 ENCOUNTER — Encounter (HOSPITAL_COMMUNITY): Payer: Self-pay | Admitting: Psychiatry

## 2020-05-22 ENCOUNTER — Other Ambulatory Visit: Payer: Self-pay

## 2020-05-22 ENCOUNTER — Encounter (INDEPENDENT_AMBULATORY_CARE_PROVIDER_SITE_OTHER): Payer: Self-pay | Admitting: Family Medicine

## 2020-05-22 ENCOUNTER — Ambulatory Visit (INDEPENDENT_AMBULATORY_CARE_PROVIDER_SITE_OTHER): Payer: Medicare Other | Admitting: Family Medicine

## 2020-05-22 ENCOUNTER — Ambulatory Visit (INDEPENDENT_AMBULATORY_CARE_PROVIDER_SITE_OTHER): Payer: Medicare Other | Admitting: Psychiatry

## 2020-05-22 ENCOUNTER — Encounter (HOSPITAL_COMMUNITY): Payer: Self-pay | Admitting: Psychiatry

## 2020-05-22 VITALS — BP 127/75 | HR 70 | Temp 97.7°F | Ht 65.0 in | Wt 262.0 lb

## 2020-05-22 DIAGNOSIS — Z8249 Family history of ischemic heart disease and other diseases of the circulatory system: Secondary | ICD-10-CM | POA: Diagnosis not present

## 2020-05-22 DIAGNOSIS — Z638 Other specified problems related to primary support group: Secondary | ICD-10-CM | POA: Diagnosis not present

## 2020-05-22 DIAGNOSIS — Z6841 Body Mass Index (BMI) 40.0 and over, adult: Secondary | ICD-10-CM | POA: Diagnosis not present

## 2020-05-22 DIAGNOSIS — I10 Essential (primary) hypertension: Secondary | ICD-10-CM | POA: Diagnosis not present

## 2020-05-22 DIAGNOSIS — E1169 Type 2 diabetes mellitus with other specified complication: Secondary | ICD-10-CM | POA: Diagnosis not present

## 2020-05-22 DIAGNOSIS — I4891 Unspecified atrial fibrillation: Secondary | ICD-10-CM | POA: Diagnosis not present

## 2020-05-22 DIAGNOSIS — F419 Anxiety disorder, unspecified: Secondary | ICD-10-CM

## 2020-05-22 MED ORDER — VITAMIN D3 75 MCG (3000 UT) PO TABS: 1.0000 | ORAL_TABLET | Freq: Every day | ORAL | 0 refills | Status: DC

## 2020-05-22 NOTE — Progress Notes (Signed)
Virtual Visit via Telephone Note  I connected with Cynthia Frost on 05/22/20 at  1:30 PM EDT by telephone and verified that I am speaking with the correct person using two identifiers.  Location: Patient: Patient Home Provider: Home Office   I discussed the limitations of evaluation and management by telemedicine and the availability of in person appointments. The patient expressed understanding and agreed to proceed.  History of Present Illness: Family conflict and Anxiety Disorder, unspecified   Treatment Plan Goals: 1) [name] would like to change core belief of "I don't trust people" in connection with childhood traumas. 2) [name] would like to better connect with her self-concept in order to improve self-esteem and self-acceptance.   Observations/Objective: Counselor met with Client for individual therapy via telephone. Counselor assessed MH symptoms and progress on treatment plan goals, with patient reporting that she was no longer in need of therapy because she has eliminated stressors. Counselor explored the need for discharge, with client noting she has come to accept her daughters addiction and is no longer allowing herself to be negatively impacted. Client presents with no symptoms of depression and mild anxiety. Client denied suicidal ideation or self-harm behaviors. Counselor prompted the client to discuss how she plans to fill time, once daughter in treatment program, officially starting her "empty nesting stage". Client noted desire to spend more time with husband, reducing hours at work, engaging in aquatic exercise group, singing group, dance group, looking into activities at [employer] and [employer]. Counselor celebrated the clients success in treatment and progress on goals. Counselor reviewed numbers to call in case of emergency or to reconnect with therapist. Client noted awareness of these resources and expressed gratitude for therapeutic  interventions.    Assessment and Plan: Counselor discharging client from counseling services due to progress on treatment plan goals, stabilization of self and environment and reduction of depressive and anxiety symptoms.   Follow Up Instructions: The patient was advised to call back or seek an in-person evaluation if the symptoms worsen or if the condition fails to improve as anticipated.  I provided 15 minutes of non-face-to-face time during this encounter.   Lise Auer, LCSW

## 2020-05-24 ENCOUNTER — Encounter (INDEPENDENT_AMBULATORY_CARE_PROVIDER_SITE_OTHER): Payer: Self-pay | Admitting: Family Medicine

## 2020-05-24 DIAGNOSIS — I10 Essential (primary) hypertension: Secondary | ICD-10-CM | POA: Insufficient documentation

## 2020-05-24 DIAGNOSIS — E119 Type 2 diabetes mellitus without complications: Secondary | ICD-10-CM | POA: Insufficient documentation

## 2020-05-24 DIAGNOSIS — Z6841 Body Mass Index (BMI) 40.0 and over, adult: Secondary | ICD-10-CM | POA: Insufficient documentation

## 2020-05-24 NOTE — Progress Notes (Signed)
Chief Complaint:   OBESITY Cynthia Frost is here to discuss her progress with her obesity treatment plan along with follow-up of her obesity related diagnoses. Cynthia Frost is on the Category 3 Plan and states she is following her eating plan approximately 0% of the time. Cynthia Frost states she is doing 0 minutes 0 times per week.  Today's visit was #: 5 Starting weight: 259 lbs Starting date: 12/29/2019 Today's weight: 262 lbs Today's date: 05/22/2020 Total lbs lost to date: 0 Total lbs lost since last in-office visit: 0  Interim History: Gayla has not had an office visit since 02/22/2020 due to family issues (her daughter has mental illness). She has been off the plan over the past few months. She skips meals at times.  Subjective:   1. Type 2 diabetes mellitus with other specified complication, without long-term current use of insulin (HCC) Cynthia Frost's last A1c was 7.0. She stopped metformin due to insomnia 3 weeks ago. Her fasting BGs range in 120's, and 2 hour post prandial ranges in 150's. She would like to hold off on starting another medication at this time. I discussed labs with the patient today.  Lab Results  Component Value Date   HGBA1C 7.0 (H) 12/29/2019   Lab Results  Component Value Date   LDLCALC 59 12/29/2019   CREATININE 0.75 12/29/2019   Lab Results  Component Value Date   INSULIN 19.4 12/29/2019   2. Essential hypertension Cynthia Frost's blood pressure is well controlled. Cardiovascular ROS: no chest pain or dyspnea on exertion.  BP Readings from Last 3 Encounters:  05/22/20 127/75  03/01/20 (!) 156/74  02/10/20 (!) 163/77   Lab Results  Component Value Date   CREATININE 0.75 12/29/2019   CREATININE 0.67 06/16/2019   CREATININE 0.81 01/01/2018   Assessment/Plan:   1. Type 2 diabetes mellitus with other specified complication, without long-term current use of insulin (HCC) Good blood sugar control is important to decrease the likelihood of diabetic  complications such as nephropathy, neuropathy, limb loss, blindness, coronary artery disease, and death. Intensive lifestyle modification including diet, exercise and weight loss are the first line of treatment for diabetes. Cynthia Frost will continue to work on diet and will follow as directed. I advised her that if A1c increases above 7 we will need to discuss adding a medication, possibly a GLP-1 agonist since she is not tolerating metformin (insomnia).    2. Essential hypertension Cynthia Frost will continue her medications, and will continue working on healthy weight loss and exercise to improve blood pressure control. We will watch for signs of hypotension as she continues her lifestyle modifications.  3. Class 3 severe obesity with serious comorbidity and body mass index (BMI) of 40.0 to 44.9 in adult, unspecified obesity type (HCC) Cynthia Frost is currently in the action stage of change. As such, her goal is to continue with weight loss efforts. She has agreed to the Category 3 Plan.   Additional lunch options given. Cynthia Frost may substitute fruit for a carbohydrate.  Exercise goals: No exercise has been prescribed at this time.  Behavioral modification strategies: increasing lean protein intake, decreasing simple carbohydrates, no skipping meals and planning for success.  Cynthia Frost has agreed to follow-up with our clinic in 2 weeks. She was informed of the importance of frequent follow-up visits to maximize her success with intensive lifestyle modifications for her multiple health conditions.   Objective:   Blood pressure 127/75, pulse 70, temperature 97.7 F (36.5 C), temperature source Oral, height 5\' 5"  (1.651 m), weight 262  lb (118.8 kg), SpO2 100 %. Body mass index is 43.6 kg/m.  General: Cooperative, alert, well developed, in no acute distress. HEENT: Conjunctivae and lids unremarkable. Cardiovascular: Regular rhythm.  Lungs: Normal work of breathing. Neurologic: No focal deficits.   Lab  Results  Component Value Date   CREATININE 0.75 12/29/2019   BUN 11 12/29/2019   NA 137 12/29/2019   K 3.9 12/29/2019   CL 97 12/29/2019   CO2 25 12/29/2019   Lab Results  Component Value Date   ALT 18 12/29/2019   AST 19 12/29/2019   ALKPHOS 90 12/29/2019   BILITOT 0.8 12/29/2019   Lab Results  Component Value Date   HGBA1C 7.0 (H) 12/29/2019   Lab Results  Component Value Date   INSULIN 19.4 12/29/2019   Lab Results  Component Value Date   TSH 1.150 12/29/2019   Lab Results  Component Value Date   CHOL 136 12/29/2019   HDL 58 12/29/2019   LDLCALC 59 12/29/2019   TRIG 105 12/29/2019   Lab Results  Component Value Date   WBC 7.0 12/29/2019   HGB 13.5 12/29/2019   HCT 42.3 12/29/2019   MCV 90 12/29/2019   PLT 220 12/29/2019   No results found for: IRON, TIBC, FERRITIN  Obesity Behavioral Intervention Documentation for Insurance:   Approximately 15 minutes were spent on the discussion below.  ASK: We discussed the diagnosis of obesity with Cynthia Frost today and Cynthia Frost agreed to give [name] permission to discuss obesity behavioral modification therapy today.  ASSESS: Cynthia Frost has the diagnosis of obesity and her BMI today is 43.6. Cynthia Frost is in the action stage of change.   ADVISE: Cynthia Frost was educated on the multiple health risks of obesity as well as the benefit of weight loss to improve her health. She was advised of the need for long term treatment and the importance of lifestyle modifications to improve her current health and to decrease her risk of future health problems.  AGREE: Multiple dietary modification options and treatment options were discussed and Cynthia Frost agreed to follow the recommendations documented in the above note.  ARRANGE: Cynthia Frost was educated on the importance of frequent visits to treat obesity as outlined per CMS and USPSTF guidelines and agreed to schedule her next follow up appointment today.  Attestation Statements:    Reviewed by clinician on day of visit: allergies, medications, problem list, medical history, surgical history, family history, social history, and previous encounter notes.   Wilhemena Durie, am acting as Location manager for [employer], FNP-C.  I have reviewed the above documentation for accuracy and completeness, and I agree with the above. -  Georgianne Fick, FNP

## 2020-06-05 ENCOUNTER — Encounter (INDEPENDENT_AMBULATORY_CARE_PROVIDER_SITE_OTHER): Payer: Self-pay | Admitting: Family Medicine

## 2020-06-05 ENCOUNTER — Other Ambulatory Visit: Payer: Self-pay

## 2020-06-05 ENCOUNTER — Ambulatory Visit (INDEPENDENT_AMBULATORY_CARE_PROVIDER_SITE_OTHER): Payer: Medicare Other | Admitting: Family Medicine

## 2020-06-05 VITALS — BP 132/70 | HR 63 | Temp 97.3°F | Ht 65.0 in | Wt 261.0 lb

## 2020-06-05 DIAGNOSIS — E1159 Type 2 diabetes mellitus with other circulatory complications: Secondary | ICD-10-CM | POA: Diagnosis not present

## 2020-06-05 DIAGNOSIS — Z9189 Other specified personal risk factors, not elsewhere classified: Secondary | ICD-10-CM | POA: Diagnosis not present

## 2020-06-05 DIAGNOSIS — I10 Essential (primary) hypertension: Secondary | ICD-10-CM | POA: Diagnosis not present

## 2020-06-05 DIAGNOSIS — E66813 Obesity, class 3: Secondary | ICD-10-CM

## 2020-06-05 DIAGNOSIS — I739 Peripheral vascular disease, unspecified: Secondary | ICD-10-CM

## 2020-06-05 DIAGNOSIS — E1169 Type 2 diabetes mellitus with other specified complication: Secondary | ICD-10-CM

## 2020-06-05 DIAGNOSIS — Z6841 Body Mass Index (BMI) 40.0 and over, adult: Secondary | ICD-10-CM

## 2020-06-05 DIAGNOSIS — E559 Vitamin D deficiency, unspecified: Secondary | ICD-10-CM | POA: Diagnosis not present

## 2020-06-05 DIAGNOSIS — I152 Hypertension secondary to endocrine disorders: Secondary | ICD-10-CM

## 2020-06-05 DIAGNOSIS — I4891 Unspecified atrial fibrillation: Secondary | ICD-10-CM | POA: Diagnosis not present
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
## 2020-06-06 LAB — VITAMIN D 25 HYDROXY (VIT D DEFICIENCY, FRACTURES): Vit D, 25-Hydroxy: 50.9 ng/mL (ref 30.0–100.0)

## 2020-06-06 LAB — HEMOGLOBIN A1C
Est. average glucose Bld gHb Est-mCnc: 148 mg/dL
Hgb A1c MFr Bld: 6.8 % — ABNORMAL HIGH (ref 4.8–5.6)

## 2020-06-06 LAB — INSULIN, RANDOM: INSULIN: 17.3 u[IU]/mL (ref 2.6–24.9)

## 2020-06-09 ENCOUNTER — Ambulatory Visit
Admission: EM | Admit: 2020-06-09 | Discharge: 2020-06-09 | Disposition: A | Discharge status: Home / Self Care | Payer: Medicare Other

## 2020-06-09 ENCOUNTER — Other Ambulatory Visit: Payer: Self-pay

## 2020-06-09 DIAGNOSIS — Z9109 Other allergy status, other than to drugs and biological substances: Secondary | ICD-10-CM | POA: Diagnosis not present

## 2020-06-09 DIAGNOSIS — R059 Cough, unspecified: Secondary | ICD-10-CM

## 2020-06-09 DIAGNOSIS — R05 Cough: Secondary | ICD-10-CM

## 2020-06-09 MED ORDER — BENZONATATE 100 MG PO CAPS
100.0000 mg | ORAL_CAPSULE | Freq: Three times a day (TID) | ORAL | 0 refills | Status: DC
Start: 2020-06-09 — End: 2020-11-01

## 2020-06-09 MED ORDER — FLUTICASONE PROPIONATE 50 MCG/ACT NA SUSP
1.0000 | Freq: Every day | NASAL | 0 refills | Status: DC
Start: 2020-06-09 — End: 2020-11-01

## 2020-06-09 MED ORDER — CETIRIZINE HCL 10 MG PO TABS
10.0000 mg | ORAL_TABLET | Freq: Every day | ORAL | 0 refills | Status: DC
Start: 2020-06-09 — End: 2022-05-20

## 2020-06-09 NOTE — ED Provider Notes (Addendum)
[facility]    CSN: [id number] Arrival date & time: 06/09/20  1118      History   Chief Complaint Chief Complaint  Patient presents with  . Sore Throat  . Nasal Congestion  . Tinnitus    HPI Cynthia Frost is a 69 y.o. female with extensive medical history as outlined below including atrial fibrillation, hypertension, diabetes presenting for possible allergies.  Endorsing nasal congestion, sore throat/postnasal drip for the last few days.  Did have cough 1 week prior, though this resolved.  Patient does note that she had new floors put in this past week.  No difficulty breathing, productive cough, chest pain or palpitations.  No fatigue.  Is fully Covid vaccinated, denies known sick contacts.   Past Medical History:  Diagnosis Date  . Anxiety   . Arthritis   . Atrial fibrillation ([place])   . Depression   . Diabetes mellitus without complication ([place])    no meds yet  . Full dentures   . Hypertension   . Hypertension   . Joint pain   . Lactose intolerance   . Osteoarthritis   . Palpitations   . Prediabetes   . Stenosis of right middle cerebral artery 2011  . Wears glasses     Patient Active Problem List   Diagnosis Date Noted  . Diabetes mellitus ([place]) 05/24/2020  . Essential hypertension 05/24/2020  . Class 3 severe obesity with serious comorbidity and body mass index (BMI) of 40.0 to 44.9 in adult ([place]) 05/24/2020  . Plantar fasciitis 06/16/2018  . Stenosis of right middle cerebral artery     Past Surgical History:  Procedure Laterality Date  . CEREBRAL ANGIOGRAM    . COLONOSCOPY    . DIAGNOSTIC LAPAROSCOPY     fibroid  . MASS EXCISION Right 04/27/2014   Procedure: EXCISION MASS RIGHT RING FINGER DEBRIDEMENT PROXIMAL INTERPHALANGEAL JOINT RIGHT RING FINGER (PIP);  Surgeon: Wynonia Sours, MD;  Location: [place];  Service: Orthopedics;  Laterality: Right;    OB History    Gravida  3   Para      Term      Preterm      AB        Living        SAB      TAB      Ectopic      Multiple      Live Births               Home Medications    Prior to Admission medications   Medication Sig Start Date End Date Taking? Authorizing Provider  amLODipine (NORVASC) 10 MG tablet Take 1 tablet (10 mg total) by mouth daily. 08/03/19   Lelon Perla, MD  atorvastatin (LIPITOR) 10 MG tablet Take 10 mg by mouth daily.    [provider]  benzonatate (TESSALON) 100 MG capsule Take 1 capsule (100 mg total) by mouth every 8 (eight) hours. 06/09/20   Hall-Potvin, [place], PA-C  Boswellia-Glucosamine-Vit D (OSTEO BI-FLEX ONE PER DAY PO) Take by mouth. 2 tabs per day    [provider]  carvedilol (COREG) 25 MG tablet Take 0.5 tablets (12.5 mg total) by mouth daily. 02/19/17   Lelon Perla, MD  cetirizine (ZYRTEC ALLERGY) 10 MG tablet Take 1 tablet (10 mg total) by mouth daily. 06/09/20   Hall-Potvin, [place], PA-C  Cholecalciferol (VITAMIN D3) 75 MCG (3000 UT) TABS Take 1 tablet by mouth daily. 05/22/20   [place],  Joneen Boers, FNP  clopidogrel (PLAVIX) 75 MG tablet Take 75 mg by mouth daily.    [provider]  cyanocobalamin 100 MCG tablet Take 100 mcg by mouth daily.    [provider]  doxazosin (CARDURA) 8 MG tablet Take 8 mg by mouth daily.     [provider]  DULoxetine (CYMBALTA) 30 MG capsule Take 30 mg by mouth daily.    [provider]  ELIQUIS 5 MG TABS tablet TAKE 1 TABLET TWICE A DAY 09/13/19   Lelon Perla, MD  Emollient (COLLAGEN EX) Apply 3 g topically. 3 once per day    [provider]  fluticasone (FLONASE) 50 MCG/ACT nasal spray Place 1 spray into both nostrils daily. 06/09/20   Hall-Potvin, [place], PA-C  Lido-Menthol-Methyl Sal-Camph (CBD KINGS EX) Place 2,000 mg under the tongue. CBD 2,000 once per day     [provider]  losartan-hydrochlorothiazide (HYZAAR) 100-25 MG per tablet Take 1 tablet by mouth daily.    [provider]    Family History Family History  Problem Relation Age of Onset  . Dementia Mother   . Hypertension Mother   . Cancer Father   . Obesity Father   . Obesity Sister     Social History Social History   Tobacco Use  . Smoking status: Never Smoker  . Smokeless tobacco: Never Used  Vaping Use  . Vaping Use: Never used  Substance Use Topics  . Alcohol use: Yes    Comment: Occasional  . Drug use: No     Allergies   Corticosteroids, Resource thickenup dairy [compleat], and Tomato   Review of Systems As per HPI   Physical Exam Triage Vital Signs ED Triage Vitals  Enc Vitals Group     BP 06/09/20 1130 (!) 143/80     Pulse Rate 06/09/20 1130 81     Resp 06/09/20 1130 20     Temp 06/09/20 1130 98.5 F (36.9 C)     Temp Source 06/09/20 1130 Oral     SpO2 06/09/20 1130 96 %     Weight --      Height --      Head Circumference --      Peak Flow --      Pain Score 06/09/20 1129 3     Pain Loc --      Pain Edu? --      Excl. in [place]? --    No data found.  Updated Vital Signs BP (!) 143/80 (BP Location: Right Arm)   Pulse 81   Temp 98.5 F (36.9 C) (Oral)   Resp 20   SpO2 96%   Visual Acuity Right Eye Distance:   Left Eye Distance:   Bilateral Distance:    Right Eye Near:   Left Eye Near:    Bilateral Near:     Physical Exam Constitutional:      General: She is not in acute distress.    Appearance: She is obese. She is not ill-appearing or diaphoretic.  HENT:     Head: Normocephalic and atraumatic.     Mouth/Throat:     Mouth: Mucous membranes are moist.     Pharynx: Oropharynx is clear. No oropharyngeal exudate or posterior oropharyngeal erythema.  Eyes:     General: No scleral icterus.    Conjunctiva/sclera: Conjunctivae normal.     Pupils: Pupils are equal, round, and reactive to light.  Neck:     Comments: Trachea midline, negative JVD Cardiovascular:  Rate and Rhythm: Normal rate. Rhythm irregular.     Heart sounds: No  murmur heard.  No gallop.   Pulmonary:     Effort: Pulmonary effort is normal. No respiratory distress.     Breath sounds: No wheezing, rhonchi or rales.  Musculoskeletal:     Cervical back: Neck supple. No tenderness.  Lymphadenopathy:     Cervical: No cervical adenopathy.  Skin:    Capillary Refill: Capillary refill takes less than 2 seconds.     Coloration: Skin is not jaundiced or pale.     Findings: No rash.  Neurological:     General: No focal deficit present.     Mental Status: She is alert and oriented to person, place, and time.      UC Treatments / Results  Labs (all labs ordered are listed, but only abnormal results are displayed) Labs Reviewed  NOVEL CORONAVIRUS, NAA    EKG   Radiology No results found.  Procedures Procedures (including critical care time)  Medications Ordered in UC Medications - No data to display  Initial Impression / Assessment and Plan / UC Course  I have reviewed the triage vital signs and the nursing notes.  Pertinent labs & imaging results that were available during my care of the patient were reviewed by me and considered in my medical decision making (see chart for details).     Patient afebrile, nontoxic, with SpO2 96%.  Covid PCR pending.  Patient to quarantine until results are back.  We will treat supportively as outlined below.  Return precautions discussed, patient verbalized understanding and is agreeable to plan. Final Clinical Impressions(s) / UC Diagnoses   Final diagnoses:  Environmental allergies  Cough     Discharge Instructions     Tessalon for cough. Start flonase, atrovent nasal spray for nasal congestion/drainage. You can use over the counter nasal saline rinse such as neti pot for nasal congestion. Keep hydrated, your urine should be clear to pale yellow in color. Tylenol/motrin for fever and pain. Monitor for any worsening of symptoms, chest pain, shortness of breath, wheezing, swelling of the throat, go  to the emergency department for further evaluation needed.     ED Prescriptions    Medication Sig Dispense Auth. Provider   cetirizine (ZYRTEC ALLERGY) 10 MG tablet Take 1 tablet (10 mg total) by mouth daily. 30 tablet Hall-Potvin, [place], PA-C   fluticasone (FLONASE) 50 MCG/ACT nasal spray Place 1 spray into both nostrils daily. 16 g Hall-Potvin, [place], PA-C   benzonatate (TESSALON) 100 MG capsule Take 1 capsule (100 mg total) by mouth every 8 (eight) hours. 21 capsule Hall-Potvin, [place], PA-C     PDMP not reviewed this encounter.   Hall-Potvin, [place], PA-C 06/09/20 1257    Hall-Potvin, [place], [place] 06/09/20 1257

## 2020-06-09 NOTE — ED Triage Notes (Signed)
Pt presents with nasal congestion, sore throat and ringing ear x 2 days. Denies fever, body aches, cough. Flonase gives relief.

## 2020-06-09 NOTE — Discharge Instructions (Signed)

## 2020-06-10 LAB — SARS-COV-2, NAA 2 DAY TAT

## 2020-06-10 LAB — NOVEL CORONAVIRUS, NAA: SARS-CoV-2, NAA: NOT DETECTED

## 2020-06-12 NOTE — Progress Notes (Signed)
HPI: FU atrial fibrillation and dyspnea. Chest x-ray November 2017 showed CHF with mild interstitial edema and small pleural effusions.Patient states she was told 20 years ago that she may have had a heart attack. This was in [place]. No records available. Echocardiogram December [id number] normal LV systolic function, mild left atrial enlargement. Nuclear study December 2017 showed ejection fraction 51% and no ischemia or infarction.At last ov, pt noted to be in atrial fibrillation and she elected not to pursue cardioversion.Since last seenpatient denies dyspnea, chest pain, palpitations, syncope or bleeding.  Current Outpatient Medications  Medication Sig Dispense Refill  . amLODipine (NORVASC) 10 MG tablet Take 1 tablet (10 mg total) by mouth daily. 90 tablet 3  . atorvastatin (LIPITOR) 10 MG tablet Take 10 mg by mouth daily.    . benzonatate (TESSALON) 100 MG capsule Take 1 capsule (100 mg total) by mouth every 8 (eight) hours. 21 capsule 0  . Boswellia-Glucosamine-Vit D (OSTEO BI-FLEX ONE PER DAY PO) Take by mouth. 2 tabs per day    . carvedilol (COREG) 25 MG tablet Take 0.5 tablets (12.5 mg total) by mouth daily. 30 tablet 2  . cetirizine (ZYRTEC ALLERGY) 10 MG tablet Take 1 tablet (10 mg total) by mouth daily. 30 tablet 0  . Cholecalciferol (VITAMIN D3) 75 MCG (3000 UT) TABS Take 1 tablet by mouth daily. 30 tablet 0  . clopidogrel (PLAVIX) 75 MG tablet Take 75 mg by mouth daily.    . cyanocobalamin 100 MCG tablet Take 100 mcg by mouth daily.    Marland Kitchen doxazosin (CARDURA) 8 MG tablet Take 8 mg by mouth daily.     . DULoxetine (CYMBALTA) 30 MG capsule Take 30 mg by mouth daily.    Marland Kitchen ELIQUIS 5 MG TABS tablet TAKE 1 TABLET TWICE A DAY 180 tablet 3  . Emollient (COLLAGEN EX) Apply 3 g topically. 3 once per day    . fluticasone (FLONASE) 50 MCG/ACT nasal spray Place 1 spray into both nostrils daily. 16 g 0  . Lido-Menthol-Methyl Sal-Camph (CBD KINGS EX) Place 2,000 mg under the  tongue. CBD 2,000 once per day     . losartan-hydrochlorothiazide (HYZAAR) 100-25 MG per tablet Take 1 tablet by mouth daily.     No current facility-administered medications for this visit.     Past Medical History:  Diagnosis Date  . Anxiety   . Arthritis   . Atrial fibrillation ([place])   . Depression   . Diabetes mellitus without complication ([place])    no meds yet  . Full dentures   . Hypertension   . Hypertension   . Joint pain   . Lactose intolerance   . Osteoarthritis   . Palpitations   . Prediabetes   . Stenosis of right middle cerebral artery 2011  . Wears glasses     Past Surgical History:  Procedure Laterality Date  . CEREBRAL ANGIOGRAM    . COLONOSCOPY    . DIAGNOSTIC LAPAROSCOPY     fibroid  . MASS EXCISION Right 04/27/2014   Procedure: EXCISION MASS RIGHT RING FINGER DEBRIDEMENT PROXIMAL INTERPHALANGEAL JOINT RIGHT RING FINGER (PIP);  Surgeon: Wynonia Sours, MD;  Location: [place];  Service: Orthopedics;  Laterality: Right;    Social History   Socioeconomic History  . Marital status: Married    Spouse name: Not on file  . Number of children: 2  . Years of education: Not on file  . Highest education level: Not on file  Occupational History  . Not on file  Tobacco Use  . Smoking status: Never Smoker  . Smokeless tobacco: Never Used  Vaping Use  . Vaping Use: Never used  Substance and Sexual Activity  . Alcohol use: Yes    Comment: Occasional  . Drug use: No  . Sexual activity: Not on file  Other Topics Concern  . Not on file  Social History Narrative  . Not on file   Social Determinants of Health   Financial Resource Strain:   . Difficulty of Paying Living Expenses:   Food Insecurity:   . Worried About Charity fundraiser in the Last Year:   . Arboriculturist in the Last Year:   Transportation Needs:   . Film/video editor (Medical):   Marland Kitchen Lack of Transportation (Non-Medical):   Physical Activity:   . Days of Exercise  per Week:   . Minutes of Exercise per Session:   Stress:   . Feeling of Stress :   Social Connections:   . Frequency of Communication with Friends and Family:   . Frequency of Social Gatherings with Friends and Family:   . Attends Religious Services:   . Active Member of Clubs or Organizations:   . Attends Archivist Meetings:   Marland Kitchen Marital Status:   Intimate Partner Violence:   . Fear of Current or Ex-Partner:   . Emotionally Abused:   Marland Kitchen Physically Abused:   . Sexually Abused:     Family History  Problem Relation Age of Onset  . Dementia Mother   . Hypertension Mother   . Cancer Father   . Obesity Father   . Obesity Sister     ROS: no fevers or chills, productive cough, hemoptysis, dysphasia, odynophagia, melena, hematochezia, dysuria, hematuria, rash, seizure activity, orthopnea, PND, pedal edema, claudication. Remaining systems are negative.  Physical Exam: Well-developed well-nourished in no acute distress.  Skin is warm and dry.  HEENT is normal.  Neck is supple.  Chest is clear to auscultation with normal expansion.  Cardiovascular exam is irregular Abdominal exam nontender or distended. No masses palpated. Extremities show no edema. neuro grossly intact   A/P  1 permanent atrial fibrillation-patient previously declined cardioversion.  Continue carvedilol at present dose for rate control.  Continue apixaban.    2 hypertension-patient's blood pressure is controlled.  Continue present medical regimen.  3 history of right middle cerebral artery stenosis-continue Plavix.  Kirk Ruths, MD

## 2020-06-19 ENCOUNTER — Other Ambulatory Visit: Payer: Self-pay

## 2020-06-19 ENCOUNTER — Encounter: Payer: Self-pay | Admitting: Cardiology

## 2020-06-19 ENCOUNTER — Ambulatory Visit (INDEPENDENT_AMBULATORY_CARE_PROVIDER_SITE_OTHER): Payer: Medicare Other | Admitting: Cardiology

## 2020-06-19 VITALS — BP 134/76 | HR 69 | Temp 97.2°F | Ht 65.0 in | Wt 262.0 lb

## 2020-06-19 DIAGNOSIS — I10 Essential (primary) hypertension: Secondary | ICD-10-CM | POA: Diagnosis not present

## 2020-06-19 DIAGNOSIS — I4821 Permanent atrial fibrillation: Secondary | ICD-10-CM

## 2020-06-19 DIAGNOSIS — I6601 Occlusion and stenosis of right middle cerebral artery: Secondary | ICD-10-CM

## 2020-06-19 NOTE — Patient Instructions (Signed)
Medication Instructions:  NO CHANGE *If you need a refill on your cardiac medications before your next appointment, please call your pharmacy*   Lab Work: If you have labs (blood work) drawn today and your tests are completely normal, you will receive your results only by: . MyChart Message (if you have MyChart) OR . A paper copy in the mail If you have any lab test that is abnormal or we need to change your treatment, we will call you to review the results.   Follow-Up: At [facility], you and your health needs are our priority.  As part of our continuing mission to provide you with exceptional heart care, we have created designated Provider Care Teams.  These Care Teams include your primary Cardiologist (physician) and Advanced Practice Providers (APPs -  Physician Assistants and Nurse Practitioners) who all work together to provide you with the care you need, when you need it.  We recommend signing up for the patient portal called "MyChart".  Sign up information is provided on this After Visit Summary.  MyChart is used to connect with patients for Virtual Visits (Telemedicine).  Patients are able to view lab/test results, encounter notes, upcoming appointments, etc.  Non-urgent messages can be sent to your provider as well.   To learn more about what you can do with MyChart, go to [email or web address].    Your next appointment:   12 month(s)  The format for your next appointment:   In Person  Provider:   You may see BRIAN CRENSHAW MD or one of the following Advanced Practice Providers on your designated Care Team:    Luke Kilroy, PA-C  Callie Goodrich, PA-C  Jesse Cleaver, FNP     

## 2020-06-20 ENCOUNTER — Ambulatory Visit (INDEPENDENT_AMBULATORY_CARE_PROVIDER_SITE_OTHER): Payer: Medicare Other | Admitting: Family Medicine

## 2020-06-20 ENCOUNTER — Other Ambulatory Visit: Payer: Self-pay

## 2020-06-20 ENCOUNTER — Encounter (INDEPENDENT_AMBULATORY_CARE_PROVIDER_SITE_OTHER): Payer: Self-pay | Admitting: Family Medicine

## 2020-06-20 VITALS — BP 130/75 | HR 79 | Temp 97.6°F | Ht 65.0 in | Wt 259.0 lb

## 2020-06-20 DIAGNOSIS — I10 Essential (primary) hypertension: Secondary | ICD-10-CM | POA: Diagnosis not present

## 2020-06-20 DIAGNOSIS — E1159 Type 2 diabetes mellitus with other circulatory complications: Secondary | ICD-10-CM

## 2020-06-20 DIAGNOSIS — E559 Vitamin D deficiency, unspecified: Secondary | ICD-10-CM | POA: Diagnosis not present

## 2020-06-20 DIAGNOSIS — Z6841 Body Mass Index (BMI) 40.0 and over, adult: Secondary | ICD-10-CM | POA: Diagnosis not present

## 2020-06-20 DIAGNOSIS — E1169 Type 2 diabetes mellitus with other specified complication: Secondary | ICD-10-CM | POA: Diagnosis not present

## 2020-06-21 NOTE — Progress Notes (Signed)
Chief Complaint:   OBESITY Cynthia Frost is here to discuss her progress with her obesity treatment plan along with follow-up of her obesity related diagnoses. Cynthia Frost is on the Category 3 Plan and states she is following her eating plan approximately 90% of the time. Cynthia Frost states she is using the stationary bike for 15 minutes 7 times per week.  Today's visit was #: 7 Starting weight: 259 lbs Starting date: 12/29/2019 Today's weight: 259 lbs Today's date: 06/20/2020 Total lbs lost to date: 0 Total lbs lost since last in-office visit: 2 lbs  Interim History: Cynthia Frost says she stopped going to restaurants and thinks this is why she lost the weight.  She is not weighing foods at all, but she is drinking the [employer].  She eats all breakfast and lunch items and gets in her dinner items, but is not weighing.  For snacks, she has graham crackers and peanut butter, pork rinds, and nuts, but is not calculating the amounts.  Also, last OV, she had labs that we will review together today.  Subjective:   1. Type 2 diabetes mellitus with other specified complication, without long-term current use of insulin (HCC) Medications reviewed.  Diabetic ROS: no polyuria or polydipsia, no chest pain, dyspnea or TIA's, no numbness, tingling or pain in extremities.  She is not checking blood sugars at all.  She denies any symptoms or concerns.    Lab Results  Component Value Date   HGBA1C 6.8 (H) 06/05/2020   HGBA1C 7.0 (H) 12/29/2019   Lab Results  Component Value Date   LDLCALC 59 12/29/2019   CREATININE 0.75 12/29/2019   Lab Results  Component Value Date   INSULIN 17.3 06/05/2020   INSULIN 19.4 12/29/2019   2. Hypertension associated with type 2 diabetes mellitus ([place]) Review: taking medications as instructed, no medication side effects noted, no chest pain on exertion, no dyspnea on exertion, no swelling of ankles.  Blood pressure is at goal today.  BP Readings from Last 3  Encounters:  06/20/20 130/75  06/19/20 134/76  06/09/20 (!) 143/80   3. Vitamin D deficiency Cynthia Frost's Vitamin D level was 50.9 on 06/05/2020. She is currently taking OTC vitamin D 3000 IU each day. She denies nausea, vomiting or muscle weakness.  Assessment/Plan:   1. Type 2 diabetes mellitus with other specified complication, without long-term current use of insulin (HCC) LABS R/W pt.  A1c at goal for age etc at 6.8;  Fast insulin 17.3.   - Good blood sugar control is important to decrease the likelihood of diabetic complications such as nephropathy, neuropathy, limb loss, blindness, coronary artery disease, and death. Intensive lifestyle modification including diet, exercise and weight loss are the first line of treatment for diabetes.  Continue prudent nutritional plan, weight loss, medications per PCP.  2. Hypertension associated with type 2 diabetes mellitus ([place]) Cynthia Frost is working on healthy weight loss and exercise to improve blood pressure control. We will watch for signs of hypotension as she continues her lifestyle modifications.  Continue prudent nutritional plan, weight loss, medications per PCP.  3. Vitamin D deficiency LABS R/W pt.  Level- 50.9 and at goal of over 50.   - Low Vitamin D level contributes to fatigue and are associated with obesity, breast, and colon cancer. -  She agrees to continue to take OTC Vitamin D @3 ,000 IU daily and will follow-up for routine testing of Vitamin D, at least 2-3 times per year to avoid over-replacement.  4. Class 3 severe  obesity with serious comorbidity and body mass index (BMI) of 40.0 to 44.9 in adult, unspecified obesity type (HCC) Cynthia Frost is currently in the action stage of change. As such, her goal is to continue with weight loss efforts. She has agreed to the Category 3 Plan, but must weight foods and calculate all snack calories.  Exercise goals: As is.  Behavioral modification strategies: increasing lean protein intake,  decreasing simple carbohydrates, increasing water intake, meal planning and cooking strategies and planning for success.    Cynthia Frost has agreed to follow-up with our clinic in 2 weeks. She was informed of the importance of frequent follow-up visits to maximize her success with intensive lifestyle modifications for her multiple health conditions.     Objective:   Blood pressure 130/75, pulse 79, temperature 97.6 F (36.4 C), height 5\' 5"  (1.651 m), weight 259 lb (117.5 kg), SpO2 97 %. Body mass index is 43.1 kg/m.  General: Cooperative, alert, well developed, in no acute distress. HEENT: Conjunctivae and lids unremarkable. Cardiovascular: Regular rhythm.  Lungs: Normal work of breathing. Neurologic: No focal deficits.   Lab Results  Component Value Date   CREATININE 0.75 12/29/2019   BUN 11 12/29/2019   NA 137 12/29/2019   K 3.9 12/29/2019   CL 97 12/29/2019   CO2 25 12/29/2019   Lab Results  Component Value Date   ALT 18 12/29/2019   AST 19 12/29/2019   ALKPHOS 90 12/29/2019   BILITOT 0.8 12/29/2019   Lab Results  Component Value Date   HGBA1C 6.8 (H) 06/05/2020   HGBA1C 7.0 (H) 12/29/2019   Lab Results  Component Value Date   INSULIN 17.3 06/05/2020   INSULIN 19.4 12/29/2019   Lab Results  Component Value Date   TSH 1.150 12/29/2019   Lab Results  Component Value Date   CHOL 136 12/29/2019   HDL 58 12/29/2019   LDLCALC 59 12/29/2019   TRIG 105 12/29/2019   Lab Results  Component Value Date   WBC 7.0 12/29/2019   HGB 13.5 12/29/2019   HCT 42.3 12/29/2019   MCV 90 12/29/2019   PLT 220 12/29/2019   Obesity Behavioral Intervention Documentation for Insurance:   Approximately 15 minutes were spent on the discussion below.  ASK: We discussed the diagnosis of obesity with Cynthia Frost today and Cynthia Frost agreed to give [name] permission to discuss obesity behavioral modification therapy today.  ASSESS: Cynthia Frost has the diagnosis of obesity and her BMI today  is 43.1. Cynthia Frost is in the action stage of change.   ADVISE: Cynthia Frost was educated on the multiple health risks of obesity as well as the benefit of weight loss to improve her health. She was advised of the need for long term treatment and the importance of lifestyle modifications to improve her current health and to decrease her risk of future health problems.  AGREE: Multiple dietary modification options and treatment options were discussed and Cynthia Frost agreed to follow the recommendations documented in the above note.  ARRANGE: Cynthia Frost was educated on the importance of frequent visits to treat obesity as outlined per CMS and USPSTF guidelines and agreed to schedule her next follow up appointment today.  Attestation Statements:   Reviewed by clinician on day of visit: allergies, medications, problem list, medical history, surgical history, family history, social history, and previous encounter notes.  I, Water quality scientist, CMA, am acting as Location manager for [employer], DO.  I have reviewed the above documentation for accuracy and completeness, and I agree with the above. -  Mellody Dance, DO

## 2020-07-05 ENCOUNTER — Ambulatory Visit (INDEPENDENT_AMBULATORY_CARE_PROVIDER_SITE_OTHER): Payer: Medicare Other | Admitting: Family Medicine

## 2020-07-25 ENCOUNTER — Encounter (INDEPENDENT_AMBULATORY_CARE_PROVIDER_SITE_OTHER): Payer: Self-pay

## 2020-07-25 ENCOUNTER — Ambulatory Visit (INDEPENDENT_AMBULATORY_CARE_PROVIDER_SITE_OTHER): Payer: Medicare Other | Admitting: Family Medicine

## 2020-07-29 ENCOUNTER — Other Ambulatory Visit: Payer: Self-pay | Admitting: Cardiology

## 2020-08-01 DIAGNOSIS — Z1231 Encounter for screening mammogram for malignant neoplasm of breast: Secondary | ICD-10-CM | POA: Diagnosis not present

## 2020-08-05 DIAGNOSIS — Z23 Encounter for immunization: Secondary | ICD-10-CM | POA: Diagnosis not present

## 2020-08-17 DIAGNOSIS — D6869 Other thrombophilia: Secondary | ICD-10-CM | POA: Diagnosis not present

## 2020-08-17 DIAGNOSIS — E1129 Type 2 diabetes mellitus with other diabetic kidney complication: Secondary | ICD-10-CM | POA: Diagnosis not present

## 2020-08-17 DIAGNOSIS — I679 Cerebrovascular disease, unspecified: Secondary | ICD-10-CM | POA: Diagnosis not present

## 2020-08-17 DIAGNOSIS — Z0000 Encounter for general adult medical examination without abnormal findings: Secondary | ICD-10-CM | POA: Diagnosis not present

## 2020-08-17 DIAGNOSIS — I509 Heart failure, unspecified: Secondary | ICD-10-CM | POA: Diagnosis not present

## 2020-08-17 DIAGNOSIS — E041 Nontoxic single thyroid nodule: Secondary | ICD-10-CM | POA: Diagnosis not present

## 2020-08-17 DIAGNOSIS — I480 Paroxysmal atrial fibrillation: Secondary | ICD-10-CM | POA: Diagnosis not present

## 2020-08-17 DIAGNOSIS — M1990 Unspecified osteoarthritis, unspecified site: Secondary | ICD-10-CM | POA: Diagnosis not present

## 2020-08-17 DIAGNOSIS — I10 Essential (primary) hypertension: Secondary | ICD-10-CM | POA: Diagnosis not present

## 2020-08-17 DIAGNOSIS — R809 Proteinuria, unspecified: Secondary | ICD-10-CM | POA: Diagnosis not present

## 2020-08-17 DIAGNOSIS — F419 Anxiety disorder, unspecified: Secondary | ICD-10-CM | POA: Diagnosis not present

## 2020-09-07 ENCOUNTER — Other Ambulatory Visit: Payer: Self-pay | Admitting: Internal Medicine

## 2020-09-07 DIAGNOSIS — I4891 Unspecified atrial fibrillation: Secondary | ICD-10-CM

## 2020-10-26 DIAGNOSIS — M25362 Other instability, left knee: Secondary | ICD-10-CM | POA: Diagnosis not present

## 2020-10-26 DIAGNOSIS — M25361 Other instability, right knee: Secondary | ICD-10-CM | POA: Diagnosis not present

## 2020-10-26 DIAGNOSIS — M1712 Unilateral primary osteoarthritis, left knee: Secondary | ICD-10-CM | POA: Diagnosis not present

## 2020-10-26 DIAGNOSIS — M25562 Pain in left knee: Secondary | ICD-10-CM | POA: Diagnosis not present

## 2020-10-26 DIAGNOSIS — M25561 Pain in right knee: Secondary | ICD-10-CM | POA: Diagnosis not present

## 2020-10-26 DIAGNOSIS — M170 Bilateral primary osteoarthritis of knee: Secondary | ICD-10-CM | POA: Diagnosis not present

## 2020-10-27 DIAGNOSIS — M1711 Unilateral primary osteoarthritis, right knee: Secondary | ICD-10-CM | POA: Diagnosis not present

## 2020-10-27 DIAGNOSIS — M25561 Pain in right knee: Secondary | ICD-10-CM | POA: Diagnosis not present

## 2020-11-01 ENCOUNTER — Other Ambulatory Visit: Payer: Self-pay

## 2020-11-01 ENCOUNTER — Ambulatory Visit: Payer: Medicare Other | Admitting: Physician Assistant

## 2020-11-01 DIAGNOSIS — J069 Acute upper respiratory infection, unspecified: Secondary | ICD-10-CM | POA: Diagnosis not present

## 2020-11-01 DIAGNOSIS — Z20822 Contact with and (suspected) exposure to covid-19: Secondary | ICD-10-CM

## 2020-11-01 MED ORDER — FLUTICASONE PROPIONATE 50 MCG/ACT NA SUSP: 1.0000 | Freq: Every day | NASAL | 0 refills | Status: DC

## 2020-11-01 MED ORDER — BENZONATATE 100 MG PO CAPS: 100.0000 mg | ORAL_CAPSULE | Freq: Three times a day (TID) | ORAL | 0 refills | Status: DC

## 2020-11-01 NOTE — Progress Notes (Signed)
New Patient Office Visit  Subjective:  Patient ID: Cynthia Frost, female    DOB: Mar 11, 1951  Age: 69 y.o. MRN: [id number]  CC:  Chief Complaint  Patient presents with  . URI   Virtual Visit via Telephone Note  I connected with Cynthia Frost on 11/01/20 at  3:50 PM EST by telephone and verified that I am speaking with the correct person using two identifiers.  Location: Patient: vehicle Provider: [facility]    I discussed the limitations, risks, security and privacy concerns of performing an evaluation and management service by telephone and the availability of in person appointments. I also discussed with the patient that there may be a patient responsible charge related to this service. The patient expressed understanding and agreed to proceed.   History of Present Illness: Cynthia Frost reports that she started feeling poorly 6 days ago.  Reports she has been having body aches, loss of smell, loss of taste, fatigue, runny nose, episodes of diarrhea, sore throat, headache, unmeasured temperature, and productive cough with green sputum.  Reports that she has been using Coricidin with some relief, states she did use NyQuil one evening to help rest.  Reports she is eating and drinking okay, does feel diarrhea has resolved.  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  Reports Covid vaccine booster administered September 08, 2020    Observations/Objective: Medical history and current medications reviewed, no physical exam completed     Past Medical History:  Diagnosis Date  . Anxiety   . Arthritis   . Atrial fibrillation ([place])   . Depression   . Diabetes mellitus without complication ([place])    no meds yet  . Full dentures   . Hypertension   . Hypertension   . Joint pain   . Lactose intolerance   . Osteoarthritis   . Palpitations   . Prediabetes   . Stenosis of right middle  cerebral artery 2011  . Wears glasses     Past Surgical History:  Procedure Laterality Date  . CEREBRAL ANGIOGRAM    . COLONOSCOPY    . DIAGNOSTIC LAPAROSCOPY     fibroid  . MASS EXCISION Right 04/27/2014   Procedure: EXCISION MASS RIGHT RING FINGER DEBRIDEMENT PROXIMAL INTERPHALANGEAL JOINT RIGHT RING FINGER (PIP);  Surgeon: Wynonia Sours, MD;  Location: [facility];  Service: Orthopedics;  Laterality: Right;    Family History  Problem Relation Age of Onset  . Dementia Mother   . Hypertension Mother   . Cancer Father   . Obesity Father   . Obesity Sister     Social History   Socioeconomic History  . Marital status: Married    Spouse name: Not on file  . Number of children: 2  . Years of education: Not on file  . Highest education level: Not on file  Occupational History  . Not on file  Tobacco Use  . Smoking status: Never Smoker  . Smokeless tobacco: Never Used  Vaping Use  . Vaping Use: Never used  Substance and Sexual Activity  . Alcohol use: Yes    Comment: Occasional  . Drug use: No  . Sexual activity: Not on file  Other Topics Concern  . Not on file  Social History Narrative  . Not on file   Social Determinants of Health   Financial Resource Strain: Not on file  Food Insecurity: Not on file  Transportation Needs: Not on file  Physical Activity: Not on file  Stress: Not on file  Social Connections: Not on file  Intimate Partner Violence: Not on file    ROS Review of Systems  Constitutional: Positive for chills and fatigue. Negative for fever.  HENT: Positive for congestion and rhinorrhea. Negative for ear pain, postnasal drip and sinus pressure.   Eyes: Negative.   Respiratory: Positive for cough. Negative for shortness of breath and wheezing.   Cardiovascular: Negative.   Gastrointestinal: Positive for diarrhea. Negative for nausea and vomiting.  Endocrine: Negative.   Genitourinary: Negative.   Musculoskeletal: Positive for  myalgias.  Skin: Negative.   Allergic/Immunologic: Negative.   Neurological: Positive for headaches.  Hematological: Negative.   Psychiatric/Behavioral: Negative.     Objective:   Today's Vitals: There were no vitals taken for this visit.    Assessment & Plan:   Problem List Items Addressed This Visit      Respiratory   Acute upper respiratory infection - Primary   Relevant Medications   benzonatate (TESSALON) 100 MG capsule    Other Visit Diagnoses    Suspected COVID-19 virus infection       Relevant Orders   POC COVID-19   Novel Coronavirus, NAA ([facility])      Outpatient Encounter Medications as of 11/01/2020  Medication Sig  . amLODipine (NORVASC) 10 MG tablet TAKE 1 TABLET DAILY  . atorvastatin (LIPITOR) 10 MG tablet Take 10 mg by mouth daily.  . benzonatate (TESSALON) 100 MG capsule Take 1 capsule (100 mg total) by mouth every 8 (eight) hours.  . [place]wellia-Glucosamine-Vit D (OSTEO BI-FLEX ONE PER DAY PO) Take by mouth. 2 tabs per day  . carvedilol (COREG) 25 MG tablet Take 0.5 tablets (12.5 mg total) by mouth daily.  . cetirizine (ZYRTEC ALLERGY) 10 MG tablet Take 1 tablet (10 mg total) by mouth daily.  . Cholecalciferol (VITAMIN D3) 75 MCG (3000 UT) TABS Take 1 tablet by mouth daily.  . clopidogrel (PLAVIX) 75 MG tablet Take 75 mg by mouth daily.  . cyanocobalamin 100 MCG tablet Take 100 mcg by mouth daily.  Marland Kitchen. doxazosin (CARDURA) 8 MG tablet Take 8 mg by mouth daily.   . DULoxetine (CYMBALTA) 30 MG capsule Take 30 mg by mouth daily.  Marland Kitchen. ELIQUIS 5 MG TABS tablet TAKE 1 TABLET TWICE A DAY  . Emollient (COLLAGEN EX) Apply 3 g topically. 3 once per day  . Lido-Menthol-Methyl Sal-Camph (CBD KINGS EX) Place 2,000 mg under the tongue. CBD 2,000 once per day   . losartan-hydrochlorothiazide (HYZAAR) 100-25 MG per tablet Take 1 tablet by mouth daily.  . [DISCONTINUED] benzonatate (TESSALON) 100 MG capsule Take 1 capsule (100 mg total) by mouth every 8 (eight) hours.  .  [DISCONTINUED] fluticasone (FLONASE) 50 MCG/ACT nasal spray Place 1 spray into both nostrils daily.  . [DISCONTINUED] fluticasone (FLONASE) 50 MCG/ACT nasal spray Place 1 spray into both nostrils daily.   No facility-administered encounter medications on file as of 11/01/2020.    Assessment and Plan:  1. Suspected COVID-19 virus infection Rapid testing negative, Patient evaluated, tested and sent home with instructions for home care and self quarantine. Instructed to seek further care if symptoms worsen.  Patient education given on the danger of cold medications not approved for people with hypertension..  - POC COVID-19 - Novel Coronavirus, NAA ([facility])  2. Acute upper respiratory infection  - benzonatate (TESSALON) 100 MG capsule; Take 1 capsule (100 mg total) by mouth every 8 (eight) hours.  Dispense: 21 capsule; Refill: 0   Follow Up Instructions:    I discussed the assessment and treatment plan with the patient. The patient was provided an opportunity to ask questions and all were answered. The patient agreed with the plan and demonstrated an understanding of the instructions.   The patient was advised to call back or seek an in-person evaluation if the symptoms worsen or if the condition fails to improve as anticipated.  I provided 21 minutes of non-face-to-face time during this encounter.   Jomari Bartnik S Mayers, PA-C  Follow-up: Return if symptoms worsen or fail to improve.   Loraine Grip Mayers, PA-C

## 2020-11-01 NOTE — Patient Instructions (Addendum)
Your rapid test for Covid was negative,We will call you with the results of your send off test, and you will see the result in your MyChart account.  Continue using the Coricidin, drink plenty of liquids and get plenty of rest.  I refilled your Flonase and your Tessalon Perles, these should offer you some relief as well.  Please feel free to return to the mobile unit at the beginning of next week if you have not improved.  Cynthia Rad, PA-C Physician Assistant [facility] [email or web address]    Upper Respiratory Infection, Adult An upper respiratory infection (URI) is a common viral infection of the nose, throat, and upper air passages that lead to the lungs. The most common type of URI is the common cold. URIs usually get better on their own, without medical treatment. What are the causes? A URI is caused by a virus. You may catch a virus by:  Breathing in droplets from an infected person's cough or sneeze.  Touching something that has been exposed to the virus (contaminated) and then touching your mouth, nose, or eyes. What increases the risk? You are more likely to get a URI if:  You are very young or very old.  It is autumn or winter.  You have close contact with others, such as at a daycare, school, or health care facility.  You smoke.  You have long-term (chronic) heart or lung disease.  You have a weakened disease-fighting (immune) system.  You have nasal allergies or asthma.  You are experiencing a lot of stress.  You work in an area that has poor air circulation.  You have poor nutrition. What are the signs or symptoms? A URI usually involves some of the following symptoms:  Runny or stuffy (congested) nose.  Sneezing.  Cough.  Sore throat.  Headache.  Fatigue.  Fever.  Loss of appetite.  Pain in your forehead, behind your eyes, and over your cheekbones (sinus pain).  Muscle  aches.  Redness or irritation of the eyes.  Pressure in the ears or face. How is this diagnosed? This condition may be diagnosed based on your medical history and symptoms, and a physical exam. Your health care provider may use a cotton swab to take a mucus sample from your nose (nasal swab). This sample can be tested to determine what virus is causing the illness. How is this treated? URIs usually get better on their own within 7-10 days. You can take steps at home to relieve your symptoms. Medicines cannot cure URIs, but your health care provider may recommend certain medicines to help relieve symptoms, such as:  Over-the-counter cold medicines.  Cough suppressants. Coughing is a type of defense against infection that helps to clear the respiratory system, so take these medicines only as recommended by your health care provider.  Fever-reducing medicines. Follow these instructions at home: Activity  Rest as needed.  If you have a fever, stay home from work or school until your fever is gone or until your health care provider says you are no longer contagious. Your health care provider may have you wear a face mask to prevent your infection from spreading. Relieving symptoms  Gargle with a salt-water mixture 3-4 times a day or as needed. To make a salt-water mixture, completely dissolve -1 tsp of salt in 1 cup of warm water.  Use a cool-mist humidifier to add moisture to the air. This can help you breathe more easily. Eating and drinking   Drink enough fluid  to keep your urine pale yellow.  Eat soups and other clear broths. General instructions   Take over-the-counter and prescription medicines only as told by your health care provider. These include cold medicines, fever reducers, and cough suppressants.  Do not use any products that contain nicotine or tobacco, such as cigarettes and e-cigarettes. If you need help quitting, ask your health care provider.  Stay away from  secondhand smoke.  Stay up to date on all immunizations, including the yearly (annual) flu vaccine.  Keep all follow-up visits as told by your health care provider. This is important. How to prevent the spread of infection to others   URIs can be passed from person to person (are contagious). To prevent the infection from spreading: ? Wash your hands often with soap and water. If soap and water are not available, use hand sanitizer. ? Avoid touching your mouth, face, eyes, or nose. ? Cough or sneeze into a tissue or your sleeve or elbow instead of into your hand or into the air. Contact a health care provider if:  You are getting worse instead of better.  You have a fever or chills.  Your mucus is brown or red.  You have yellow or brown discharge coming from your nose.  You have pain in your face, especially when you bend forward.  You have swollen neck glands.  You have pain while swallowing.  You have white areas in the back of your throat. Get help right away if:  You have shortness of breath that gets worse.  You have severe or persistent: ? Headache. ? Ear pain. ? Sinus pain. ? Chest pain.  You have chronic lung disease along with any of the following: ? Wheezing. ? Prolonged cough. ? Coughing up blood. ? A change in your usual mucus.  You have a stiff neck.  You have changes in your: ? Vision. ? Hearing. ? Thinking. ? Mood. Summary  An upper respiratory infection (URI) is a common infection of the nose, throat, and upper air passages that lead to the lungs.  A URI is caused by a virus.  URIs usually get better on their own within 7-10 days.  Medicines cannot cure URIs, but your health care provider may recommend certain medicines to help relieve symptoms. This information is not intended to replace advice given to you by your health care provider. Make sure you discuss any questions you have with your health care provider. Document Revised:  11/05/2018 Document Reviewed: 06/13/2017 Elsevier Patient Education  [place].

## 2020-11-01 NOTE — Progress Notes (Signed)
Patient presents with all covid/URI symptoms

## 2020-11-02 DIAGNOSIS — M25562 Pain in left knee: Secondary | ICD-10-CM | POA: Diagnosis not present

## 2020-11-02 DIAGNOSIS — M1712 Unilateral primary osteoarthritis, left knee: Secondary | ICD-10-CM | POA: Diagnosis not present

## 2020-11-02 LAB — POC COVID19 BINAXNOW: SARS Coronavirus 2 Ag: NEGATIVE

## 2020-11-03 LAB — NOVEL CORONAVIRUS, NAA: SARS-CoV-2, NAA: NOT DETECTED

## 2020-11-03 LAB — SARS-COV-2, NAA 2 DAY TAT

## 2020-11-07 ENCOUNTER — Ambulatory Visit
Admission: EM | Admit: 2020-11-07 | Discharge: 2020-11-07 | Discharge status: Against Medical Advice | Payer: Medicare Other

## 2020-11-07 ENCOUNTER — Other Ambulatory Visit: Payer: Self-pay

## 2020-11-07 ENCOUNTER — Telehealth: Payer: Self-pay | Admitting: *Deleted

## 2020-11-07 NOTE — Telephone Encounter (Signed)
Medical Assistant left message on patient's home and cell voicemail. Voicemail states to give a call back to [place] with [facility] at [phone]. Patient is aware of PCR being negative per signed DPR

## 2020-11-07 NOTE — ED Notes (Signed)
Called pt. Walked outside to see if pt was there. No show

## 2020-11-07 NOTE — Telephone Encounter (Signed)
-----   Message from Roney Jaffe, [place] sent at 11/07/2020  8:42 AM EST ----- Please let patient know that their PCR testing for Covid was negative

## 2020-11-07 NOTE — ED Triage Notes (Signed)
Pt c/o cough and sore throat since 12/10, had 2 neg covid test. States cough is worse, now having chest tightness, and having SOB. States cough is worse at night.

## 2020-11-07 NOTE — ED Notes (Signed)
Pt not in waiting area 

## 2020-11-08 DIAGNOSIS — J069 Acute upper respiratory infection, unspecified: Secondary | ICD-10-CM | POA: Diagnosis not present

## 2020-11-09 DIAGNOSIS — M25561 Pain in right knee: Secondary | ICD-10-CM | POA: Diagnosis not present

## 2020-11-09 DIAGNOSIS — M1711 Unilateral primary osteoarthritis, right knee: Secondary | ICD-10-CM | POA: Diagnosis not present

## 2020-11-16 DIAGNOSIS — M1712 Unilateral primary osteoarthritis, left knee: Secondary | ICD-10-CM | POA: Diagnosis not present

## 2020-11-16 DIAGNOSIS — M25562 Pain in left knee: Secondary | ICD-10-CM | POA: Diagnosis not present

## 2020-12-01 DIAGNOSIS — J9801 Acute bronchospasm: Secondary | ICD-10-CM | POA: Diagnosis not present

## 2020-12-01 DIAGNOSIS — R059 Cough, unspecified: Secondary | ICD-10-CM | POA: Diagnosis not present

## 2020-12-04 ENCOUNTER — Ambulatory Visit
Admission: RE | Admit: 2020-12-04 | Discharge: 2020-12-04 | Disposition: A | Discharge status: Home / Self Care | Payer: Medicare Other | Source: Ambulatory Visit | Attending: Family Medicine | Admitting: Family Medicine

## 2020-12-04 ENCOUNTER — Other Ambulatory Visit: Payer: Self-pay | Admitting: Family Medicine

## 2020-12-04 DIAGNOSIS — R0602 Shortness of breath: Secondary | ICD-10-CM | POA: Diagnosis not present

## 2020-12-04 DIAGNOSIS — R059 Cough, unspecified: Secondary | ICD-10-CM | POA: Diagnosis not present

## 2020-12-18 ENCOUNTER — Encounter: Payer: Self-pay | Admitting: Cardiology

## 2020-12-18 ENCOUNTER — Ambulatory Visit (INDEPENDENT_AMBULATORY_CARE_PROVIDER_SITE_OTHER): Payer: Medicare Other | Admitting: Cardiology

## 2020-12-18 ENCOUNTER — Other Ambulatory Visit: Payer: Self-pay

## 2020-12-18 VITALS — BP 140/72 | HR 80 | Ht 64.0 in | Wt 270.0 lb

## 2020-12-18 DIAGNOSIS — I10 Essential (primary) hypertension: Secondary | ICD-10-CM | POA: Diagnosis not present

## 2020-12-18 DIAGNOSIS — I5032 Chronic diastolic (congestive) heart failure: Secondary | ICD-10-CM

## 2020-12-18 DIAGNOSIS — E785 Hyperlipidemia, unspecified: Secondary | ICD-10-CM | POA: Diagnosis not present

## 2020-12-18 DIAGNOSIS — I4821 Permanent atrial fibrillation: Secondary | ICD-10-CM | POA: Diagnosis not present

## 2020-12-18 MED ORDER — FUROSEMIDE 40 MG PO TABS: 40.0000 mg | ORAL_TABLET | Freq: Every day | ORAL | 3 refills | Status: DC

## 2020-12-18 MED ORDER — LOSARTAN POTASSIUM 100 MG PO TABS: 100.0000 mg | ORAL_TABLET | Freq: Every day | ORAL | 3 refills | Status: DC

## 2020-12-18 NOTE — Progress Notes (Signed)
HPI: FU atrial fibrillation and dyspnea. Chest x-ray November 2017 showed CHF with mild interstitial edema and small pleural effusions.Patient states she was told 20 years ago that she may have had a heart attack. This was in [place]. No records available. Echocardiogram December [id number] normal LV systolic function, mild left atrial enlargement. Nuclear study December 2017 showed ejection fraction 51% and no ischemia or infarction.At last ov, pt noted to be in atrial fibrillation and she elected not to pursue cardioversion.Since last seenshe notes increased dyspnea on exertion.  No orthopnea or PND.  Minimal pedal edema.  She denies exertional chest pain or syncope.  Current Outpatient Medications  Medication Sig Dispense Refill  . amLODipine (NORVASC) 10 MG tablet TAKE 1 TABLET DAILY 90 tablet 3  . atorvastatin (LIPITOR) 10 MG tablet Take 10 mg by mouth daily.    . benzonatate (TESSALON) 100 MG capsule Take 1 capsule (100 mg total) by mouth every 8 (eight) hours. 21 capsule 0  . Boswellia-Glucosamine-Vit D (OSTEO BI-FLEX ONE PER DAY PO) Take by mouth. 2 tabs per day    . carvedilol (COREG) 25 MG tablet Take 0.5 tablets (12.5 mg total) by mouth daily. 30 tablet 2  . cetirizine (ZYRTEC ALLERGY) 10 MG tablet Take 1 tablet (10 mg total) by mouth daily. 30 tablet 0  . Cholecalciferol (VITAMIN D3) 75 MCG (3000 UT) TABS Take 1 tablet by mouth daily. 30 tablet 0  . clopidogrel (PLAVIX) 75 MG tablet Take 75 mg by mouth daily.    . cyanocobalamin 100 MCG tablet Take 100 mcg by mouth daily.    Marland Kitchen doxazosin (CARDURA) 8 MG tablet Take 8 mg by mouth daily.     . DULoxetine (CYMBALTA) 30 MG capsule Take 30 mg by mouth daily.    Marland Kitchen ELIQUIS 5 MG TABS tablet TAKE 1 TABLET TWICE A DAY 180 tablet 3  . Emollient (COLLAGEN EX) Apply 3 g topically. 3 once per day    . Lido-Menthol-Methyl Sal-Camph (CBD KINGS EX) Place 2,000 mg under the tongue. CBD 2,000 once per day    .  losartan-hydrochlorothiazide (HYZAAR) 100-25 MG per tablet Take 1 tablet by mouth daily.     No current facility-administered medications for this visit.     Past Medical History:  Diagnosis Date  . Anxiety   . Arthritis   . Atrial fibrillation ([place])   . Depression   . Diabetes mellitus without complication ([place])    no meds yet  . Full dentures   . Hypertension   . Hypertension   . Joint pain   . Lactose intolerance   . Osteoarthritis   . Palpitations   . Prediabetes   . Stenosis of right middle cerebral artery 2011  . Wears glasses     Past Surgical History:  Procedure Laterality Date  . CEREBRAL ANGIOGRAM    . COLONOSCOPY    . DIAGNOSTIC LAPAROSCOPY     fibroid  . MASS EXCISION Right 04/27/2014   Procedure: EXCISION MASS RIGHT RING FINGER DEBRIDEMENT PROXIMAL INTERPHALANGEAL JOINT RIGHT RING FINGER (PIP);  Surgeon: Wynonia Sours, MD;  Location: [facility];  Service: Orthopedics;  Laterality: Right;    Social History   Socioeconomic History  . Marital status: Married    Spouse name: Not on file  . Number of children: 2  . Years of education: Not on file  . Highest education level: Not on file  Occupational History  . Not on file  Tobacco  Use  . Smoking status: Never Smoker  . Smokeless tobacco: Never Used  Vaping Use  . Vaping Use: Never used  Substance and Sexual Activity  . Alcohol use: Yes    Comment: Occasional  . Drug use: No  . Sexual activity: Not on file  Other Topics Concern  . Not on file  Social History Narrative  . Not on file   Social Determinants of Health   Financial Resource Strain: Not on file  Food Insecurity: Not on file  Transportation Needs: Not on file  Physical Activity: Not on file  Stress: Not on file  Social Connections: Not on file  Intimate Partner Violence: Not on file    Family History  Problem Relation Age of Onset  . Dementia Mother   . Hypertension Mother   . Cancer Father   . Obesity Father    . Obesity Sister     ROS: no fevers or chills, productive cough, hemoptysis, dysphasia, odynophagia, melena, hematochezia, dysuria, hematuria, rash, seizure activity, orthopnea, PND, claudication. Remaining systems are negative.  Physical Exam: Well-developed obese in no acute distress.  Skin is warm and dry.  HEENT is normal.  Neck is supple.  Chest is clear to auscultation with normal expansion.  Cardiovascular exam is irregular Abdominal exam nontender or distended. No masses palpated. Extremities show trace edema. neuro grossly intact  ECG-atrial fibrillation with PVCs or aberrantly conducted beats, left axis deviation, cannot rule out septal infarct, nonspecific T wave changes.  Personally reviewed  A/P  1 permanent atrial fibrillation-patient has previously declined cardioversion.  Plan is rate control and anticoagulation.  Continue carvedilol and apixaban.  Check hemoglobin and renal function.  2 hypertension-blood pressure controlled continue present medical regimen.  3 history right medial cerebral artery stenosis-continue medical therapy with Plavix.  4 hyperlipidemia-continue statin.  Check lipids and liver.  5 chronic diastolic congestive heart failure-patient complains of increased dyspnea on exertion.  There may be a component of volume excess.  Discontinue HCTZ.  Treat with Lasix 40 mg daily.  Check potassium, renal function and BNP in 1 week.  Repeat echocardiogram.  Kirk Ruths, MD

## 2020-12-18 NOTE — Patient Instructions (Signed)
Medication Instructions:   STOP LOSARTAN/HCTZ  START LOSARTAN 100 MG ONCE DAILY  START FUROSEMIDE 40 MG ONCE DAILY  *If you need a refill on your cardiac medications before your next appointment, please call your pharmacy*   Lab Work:  Your physician recommends that you return for lab work in: [facility]  If you have labs (blood work) drawn today and your tests are completely normal, you will receive your results only by: Marland Kitchen MyChart Message (if you have MyChart) OR . A paper copy in the mail If you have any lab test that is abnormal or we need to change your treatment, we will call you to review the results.  Testing/Procedures:  Your physician has requested that you have an echocardiogram. Echocardiography is a painless test that uses sound waves to create images of your heart. It provides your doctor with information about the size and shape of your heart and how well your heart's chambers and valves are working. This procedure takes approximately one hour. There are no restrictions for this procedure.[place]   Follow-Up: At [facility], you and your health needs are our priority.  As part of our continuing mission to provide you with exceptional heart care, we have created designated Provider Care Teams.  These Care Teams include your primary Cardiologist (physician) and Advanced Practice Providers (APPs -  Physician Assistants and Nurse Practitioners) who all work together to provide you with the care you need, when you need it.  We recommend signing up for the patient portal called "MyChart".  Sign up information is provided on this After Visit Summary.  MyChart is used to connect with patients for Virtual Visits (Telemedicine).  Patients are able to view lab/test results, encounter notes, upcoming appointments, etc.  Non-urgent messages can be sent to your provider as well.   To learn more about what you can do with MyChart, go to [email or web address].     Your next appointment:   6-8 week(s)  The format for your next appointment:   In Person  Provider:   You will see one of the following Advanced Practice Providers on your designated Care Team:    Kerin Ransom, PA-C  [place], [place]  Coletta Memos, [place]  Kirk Ruths MD will plan to see you again in 6 month(s).

## 2020-12-19 DIAGNOSIS — R059 Cough, unspecified: Secondary | ICD-10-CM | POA: Diagnosis not present

## 2020-12-19 DIAGNOSIS — R0609 Other forms of dyspnea: Secondary | ICD-10-CM | POA: Diagnosis not present

## 2020-12-19 DIAGNOSIS — I509 Heart failure, unspecified: Secondary | ICD-10-CM | POA: Diagnosis not present

## 2020-12-19 DIAGNOSIS — R9389 Abnormal findings on diagnostic imaging of other specified body structures: Secondary | ICD-10-CM | POA: Diagnosis not present

## 2020-12-19 DIAGNOSIS — E1129 Type 2 diabetes mellitus with other diabetic kidney complication: Secondary | ICD-10-CM | POA: Diagnosis not present

## 2020-12-22 DIAGNOSIS — E785 Hyperlipidemia, unspecified: Secondary | ICD-10-CM | POA: Diagnosis not present

## 2020-12-22 DIAGNOSIS — I4821 Permanent atrial fibrillation: Secondary | ICD-10-CM | POA: Diagnosis not present

## 2020-12-22 DIAGNOSIS — I5032 Chronic diastolic (congestive) heart failure: Secondary | ICD-10-CM | POA: Diagnosis not present

## 2020-12-23 LAB — CBC
Hematocrit: 40.5 % (ref 34.0–46.6)
Hemoglobin: 13.2 g/dL (ref 11.1–15.9)
MCH: 29.2 pg (ref 26.6–33.0)
MCHC: 32.6 g/dL (ref 31.5–35.7)
MCV: 90 fL (ref 79–97)
Platelets: 212 10*3/uL (ref 150–450)
RBC: 4.52 x10E6/uL (ref 3.77–5.28)
RDW: 13.3 % (ref 11.7–15.4)
WBC: 6.6 10*3/uL (ref 3.4–10.8)

## 2020-12-23 LAB — COMPREHENSIVE METABOLIC PANEL
ALT: 17 IU/L (ref 0–32)
AST: 21 IU/L (ref 0–40)
Albumin/Globulin Ratio: 1.3 (ref 1.2–2.2)
Albumin: 4.1 g/dL (ref 3.7–4.7)
Alkaline Phosphatase: 91 IU/L (ref 44–121)
BUN/Creatinine Ratio: 14 (ref 12–28)
BUN: 10 mg/dL (ref 8–27)
Bilirubin Total: 0.8 mg/dL (ref 0.0–1.2)
CO2: 24 mmol/L (ref 20–29)
Calcium: 9.1 mg/dL (ref 8.7–10.3)
Chloride: 101 mmol/L (ref 96–106)
Creatinine, Ser: 0.72 mg/dL (ref 0.57–1.00)
GFR calc Af Amer: 97 mL/min/{1.73_m2} (ref >59)
GFR calc non Af Amer: 85 mL/min/{1.73_m2} (ref >59)
Globulin, Total: 3.1 g/dL (ref 1.5–4.5)
Glucose: 116 mg/dL — ABNORMAL HIGH (ref 65–99)
Potassium: 3.8 mmol/L (ref 3.5–5.2)
Sodium: 139 mmol/L (ref 134–144)
Total Protein: 7.2 g/dL (ref 6.0–8.5)

## 2020-12-23 LAB — LIPID PANEL
Chol/HDL Ratio: 2.4 ratio (ref 0.0–4.4)
Cholesterol, Total: 133 mg/dL (ref 100–199)
HDL: 56 mg/dL (ref >39)
LDL Chol Calc (NIH): 62 mg/dL (ref 0–99)
Triglycerides: 78 mg/dL (ref 0–149)
VLDL Cholesterol Cal: 15 mg/dL (ref 5–40)

## 2020-12-23 LAB — PRO B NATRIURETIC PEPTIDE: NT-Pro BNP: 420 pg/mL — ABNORMAL HIGH (ref 0–301)

## 2021-01-02 ENCOUNTER — Encounter: Payer: Self-pay | Admitting: *Deleted

## 2021-01-05 ENCOUNTER — Other Ambulatory Visit: Payer: Self-pay

## 2021-01-05 ENCOUNTER — Ambulatory Visit (HOSPITAL_COMMUNITY): Payer: Medicare Other | Attending: Internal Medicine

## 2021-01-05 DIAGNOSIS — I4821 Permanent atrial fibrillation: Secondary | ICD-10-CM | POA: Insufficient documentation

## 2021-01-05 DIAGNOSIS — I5032 Chronic diastolic (congestive) heart failure: Secondary | ICD-10-CM | POA: Diagnosis not present

## 2021-01-06 LAB — ECHOCARDIOGRAM COMPLETE
Area-P 1/2: 3.82 cm2
MV M vel: 4.76 m/s
MV Peak grad: 90.6 mmHg
S' Lateral: 2.1 cm

## 2021-01-09 ENCOUNTER — Encounter: Payer: Self-pay | Admitting: *Deleted

## 2021-01-16 DIAGNOSIS — B2710 Cytomegaloviral mononucleosis without complications: Secondary | ICD-10-CM | POA: Diagnosis not present

## 2021-01-16 DIAGNOSIS — E282 Polycystic ovarian syndrome: Secondary | ICD-10-CM | POA: Diagnosis not present

## 2021-01-16 DIAGNOSIS — R890 Abnormal level of enzymes in specimens from other organs, systems and tissues: Secondary | ICD-10-CM | POA: Diagnosis not present

## 2021-01-16 DIAGNOSIS — B960 Mycoplasma pneumoniae [M. pneumoniae] as the cause of diseases classified elsewhere: Secondary | ICD-10-CM | POA: Diagnosis not present

## 2021-01-16 DIAGNOSIS — B2790 Infectious mononucleosis, unspecified without complication: Secondary | ICD-10-CM | POA: Diagnosis not present

## 2021-01-16 DIAGNOSIS — E559 Vitamin D deficiency, unspecified: Secondary | ICD-10-CM | POA: Diagnosis not present

## 2021-01-16 DIAGNOSIS — E279 Disorder of adrenal gland, unspecified: Secondary | ICD-10-CM | POA: Diagnosis not present

## 2021-01-16 DIAGNOSIS — Z131 Encounter for screening for diabetes mellitus: Secondary | ICD-10-CM | POA: Diagnosis not present

## 2021-01-16 DIAGNOSIS — E038 Other specified hypothyroidism: Secondary | ICD-10-CM | POA: Diagnosis not present

## 2021-01-16 DIAGNOSIS — E237 Disorder of pituitary gland, unspecified: Secondary | ICD-10-CM | POA: Diagnosis not present

## 2021-01-16 DIAGNOSIS — Z1159 Encounter for screening for other viral diseases: Secondary | ICD-10-CM | POA: Diagnosis not present

## 2021-01-16 DIAGNOSIS — E59 Dietary selenium deficiency: Secondary | ICD-10-CM | POA: Diagnosis not present

## 2021-01-16 DIAGNOSIS — J160 Chlamydial pneumonia: Secondary | ICD-10-CM | POA: Diagnosis not present

## 2021-01-16 DIAGNOSIS — E2839 Other primary ovarian failure: Secondary | ICD-10-CM | POA: Diagnosis not present

## 2021-01-16 DIAGNOSIS — E539 Vitamin B deficiency, unspecified: Secondary | ICD-10-CM | POA: Diagnosis not present

## 2021-01-16 DIAGNOSIS — D649 Anemia, unspecified: Secondary | ICD-10-CM | POA: Diagnosis not present

## 2021-01-16 DIAGNOSIS — D899 Disorder involving the immune mechanism, unspecified: Secondary | ICD-10-CM | POA: Diagnosis not present

## 2021-01-16 DIAGNOSIS — B589 Toxoplasmosis, unspecified: Secondary | ICD-10-CM | POA: Diagnosis not present

## 2021-01-16 DIAGNOSIS — Z1152 Encounter for screening for COVID-19: Secondary | ICD-10-CM | POA: Diagnosis not present

## 2021-01-30 NOTE — Progress Notes (Signed)
Cardiology Clinic Note   Patient Name: Cynthia Frost Date of Encounter: 02/01/2021  Primary Care Provider:  Gaynelle Arabian, MD Primary Cardiologist:  Kirk Ruths, MD  Patient Profile    Cynthia Frost 70 year old female presents the clinic today for follow-up evaluation of her atrial fibrillation and dyspnea.  Past Medical History    Past Medical History:  Diagnosis Date  . Anxiety   . Arthritis   . Atrial fibrillation ([place])   . Depression   . Diabetes mellitus without complication ([place])    no meds yet  . Full dentures   . Hypertension   . Hypertension   . Joint pain   . Lactose intolerance   . Osteoarthritis   . Palpitations   . Prediabetes   . Stenosis of right middle cerebral artery 2011  . Wears glasses    Past Surgical History:  Procedure Laterality Date  . CEREBRAL ANGIOGRAM    . COLONOSCOPY    . DIAGNOSTIC LAPAROSCOPY     fibroid  . MASS EXCISION Right 04/27/2014   Procedure: EXCISION MASS RIGHT RING FINGER DEBRIDEMENT PROXIMAL INTERPHALANGEAL JOINT RIGHT RING FINGER (PIP);  Surgeon: Wynonia Sours, MD;  Location: [facility];  Service: Orthopedics;  Laterality: Right;    Allergies  Allergies  Allergen Reactions  . Corticosteroids Other (See Comments)    Increases glucose levels  . Resource [employer]  . Tomato     History of Present Illness    Ms. Rolland is a PMH of atrial fibrillation, essential hypertension, chronic diastolic CHF, and hyperlipidemia.  She was last seen by Dr. Stanford Breed on 12/18/2020.  Her CXR on 1117 showed CHF with mild interstitial edema and small pleural effusion.  She reported that she had been told 20 years ago that she had a heart attack.  She lived in [place] at the time.  No records are available.  Echocardiogram 12/17 showed normal LV function, mild left atrial enlargement.  Nuclear stress test 12/17 showed ejection fraction 51% no ischemia or infarct.  During her previous  office visit she was noted to have atrial fibrillation.  At that time she did not wish to undergo DCCV.  However, when she was seen by Dr. Stanford Breed on 12/18/2020 she noted increased dyspnea on exertion.  She denied orthopnea or PND.  She had minimal lower extremity swelling.  She denied exertional chest pain and syncope.  Her CBC, CMP, and lipid panel were unremarkable.  Her medical therapy was continued.  She presents the clinic today for follow-up evaluation states she is feeling well.  She reports that she has lost a little bit of weight since her last visit.  She feels that her breathing is better.  She also reports that her daughter has moved back in with her and she has fully retired.  She is using her exercise bike for 30 minutes most days of the week and was going to water aerobics.  She continues to follow a low-sodium diet.  She reports she intermittently wears lower extremity support stockings.  I will give her the [place] support stocking sheet, have her continue to follow her low-sodium diet, have her increase her physical activity as tolerated and follow-up with Dr. Stanford Breed in 6 months.  Today we reviewed her echocardiogram and she expressed understanding.  We also discussed atrial fibrillation and DCCV process.  Today she denies chest pain, shortness of breath, lower extremity edema, fatigue, palpitations, melena, hematuria, hemoptysis, diaphoresis, weakness,  presyncope, syncope, orthopnea, and PND.   Home Medications    Prior to Admission medications   Medication Sig Start Date End Date Taking? Authorizing Provider  amLODipine (NORVASC) 10 MG tablet TAKE 1 TABLET DAILY 07/31/20   Lelon Perla, MD  atorvastatin (LIPITOR) 10 MG tablet Take 10 mg by mouth daily.    [provider]  benzonatate (TESSALON) 100 MG capsule Take 1 capsule (100 mg total) by mouth every 8 (eight) hours. 11/01/20   Mayers, Cari S, PA-C  Boswellia-Glucosamine-Vit D (OSTEO BI-FLEX ONE PER DAY PO) Take  by mouth. 2 tabs per day    [provider]  carvedilol (COREG) 25 MG tablet Take 0.5 tablets (12.5 mg total) by mouth daily. 02/19/17   Lelon Perla, MD  cetirizine (ZYRTEC ALLERGY) 10 MG tablet Take 1 tablet (10 mg total) by mouth daily. 06/09/20   Hall-Potvin, [place], PA-C  Cholecalciferol (VITAMIN D3) 75 MCG (3000 UT) TABS Take 1 tablet by mouth daily. 05/22/20   Whitmire, Joneen Boers, FNP  clopidogrel (PLAVIX) 75 MG tablet Take 75 mg by mouth daily.    [provider]  cyanocobalamin 100 MCG tablet Take 100 mcg by mouth daily.    [provider]  doxazosin (CARDURA) 8 MG tablet Take 8 mg by mouth daily.     [provider]  DULoxetine (CYMBALTA) 30 MG capsule Take 30 mg by mouth daily.    [provider]  ELIQUIS 5 MG TABS tablet TAKE 1 TABLET TWICE A DAY 09/07/20   Lelon Perla, MD  Emollient (COLLAGEN EX) Apply 3 g topically. 3 once per day    [provider]  furosemide (LASIX) 40 MG tablet Take 1 tablet (40 mg total) by mouth daily. 12/18/20 03/18/21  Lelon Perla, MD  Lido-Menthol-Methyl Sal-Camph (CBD KINGS EX) Place 2,000 mg under the tongue. CBD 2,000 once per day    [provider]  losartan (COZAAR) 100 MG tablet Take 1 tablet (100 mg total) by mouth daily. 12/18/20 03/18/21  Lelon Perla, MD    Family History    Family History  Problem Relation Age of Onset  . Dementia Mother   . Hypertension Mother   . Cancer Father   . Obesity Father   . Obesity Sister    She indicated that her mother is deceased. She indicated that her father is deceased. She indicated that only one of her two sisters is alive. She indicated that only one of her two brothers is alive. She indicated that her maternal grandmother is deceased. She indicated that her maternal grandfather is deceased. She indicated that her paternal grandmother is deceased. She indicated that her paternal grandfather is deceased.  Social History    Social  History   Socioeconomic History  . Marital status: Married    Spouse name: Not on file  . Number of children: 2  . Years of education: Not on file  . Highest education level: Not on file  Occupational History  . Not on file  Tobacco Use  . Smoking status: Never Smoker  . Smokeless tobacco: Never Used  Vaping Use  . Vaping Use: Never used  Substance and Sexual Activity  . Alcohol use: Yes    Comment: Occasional  . Drug use: No  . Sexual activity: Not on file  Other Topics Concern  . Not on file  Social History Narrative  . Not on file   Social Determinants of Health   Financial Resource Strain: Not  on file  Food Insecurity: Not on file  Transportation Needs: Not on file  Physical Activity: Not on file  Stress: Not on file  Social Connections: Not on file  Intimate Partner Violence: Not on file     Review of Systems    General:  No chills, fever, night sweats or weight changes.  Cardiovascular:  No chest pain, dyspnea on exertion, edema, orthopnea, palpitations, paroxysmal nocturnal dyspnea. Dermatological: No rash, lesions/masses Respiratory: No cough, dyspnea Urologic: No hematuria, dysuria Abdominal:   No nausea, vomiting, diarrhea, bright red blood per rectum, melena, or hematemesis Neurologic:  No visual changes, wkns, changes in mental status. All other systems reviewed and are otherwise negative except as noted above.  Physical Exam    VS:  BP 138/62   Pulse 73   Ht 5\' 5"  (1.651 m)   Wt 262 lb 3.2 oz (118.9 kg)   SpO2 94%   BMI 43.63 kg/m  , BMI Body mass index is 43.63 kg/m. GEN: Well nourished, well developed, in no acute distress. HEENT: normal. Neck: Supple, no JVD, carotid bruits, or masses. Cardiac: Irregularly irregular, no murmurs, rubs, or gallops. No clubbing, cyanosis, edema.  Radials/DP/PT 2+ and equal bilaterally.  Respiratory:  Respirations regular and unlabored, clear to auscultation bilaterally. GI: Soft, nontender, nondistended, BS +  x 4. MS: no deformity or atrophy. Skin: warm and dry, no rash. Neuro:  Strength and sensation are intact. Psych: Normal affect.  Accessory Clinical Findings    Recent Labs: 12/22/2020: ALT 17; BUN 10; Creatinine, Ser 0.72; Hemoglobin 13.2; NT-Pro BNP 420; Platelets 212; Potassium 3.8; Sodium 139   Recent Lipid Panel    Component Value Date/Time   CHOL 133 12/22/2020 1038   TRIG 78 12/22/2020 1038   HDL 56 12/22/2020 1038   CHOLHDL 2.4 12/22/2020 1038   LDLCALC 62 12/22/2020 1038    ECG personally reviewed by me today-none today. Echocardiogram 01/05/2021 IMPRESSIONS    1. Left ventricular ejection fraction, by estimation, is 60 to 65%. The  left ventricle has normal function. The left ventricle has no regional  wall motion abnormalities. There is mild left ventricular hypertrophy.  Left ventricular diastolic parameters  are indeterminate.  2. Right ventricular systolic function is mildly reduced. The right  ventricular size is normal. There is moderately elevated pulmonary artery  systolic pressure. The estimated right ventricular systolic pressure is  58.0 mmHg.  3. Left atrial size was moderately dilated.  4. The mitral valve is normal in structure. Mild mitral valve  regurgitation. No evidence of mitral stenosis.  5. The aortic valve is normal in structure. Aortic valve regurgitation is  not visualized. No aortic stenosis is present.  6. The inferior vena cava is dilated in size with >50% respiratory  variability, suggesting right atrial pressure of 8 mmHg.  Assessment & Plan   1.  Permanent atrial fibrillation-heart rate today 73 bpm.  Patient previously wished to defer DCCV. Continue carvedilol, apixaban Heart healthy low-sodium diet-salty 6 given Increase physical activity as tolerated  Chronic diastolic CHF-no increased DOE or activity intolerance.  Echocardiogram 01/05/2021 showed normal LV EF.  Details above. Continue furosemide, carvedilol Heart  healthy low-sodium diet-salty 6 given Increase physical activity as tolerated  Essential hypertension-BP today 138/62.  Well-controlled at home. Continue carvedilol, amlodipine, Cardura, losartan Heart healthy low-sodium diet-salty 6 given Increase physical activity as tolerated  Hyperlipidemia-12/22/2020: Cholesterol, Total 133; HDL 56; LDL Chol Calc (NIH) 62; Triglycerides 78 Continue atorvastatin Heart healthy low-sodium diet-salty 6 given Increase physical activity  as tolerated  Disposition: Follow-up with Dr. Stanford Breed in 6 months.  Jossie Ng. Kamie Korber NP-C    02/01/2021, 3:03 PM [facility] [address] Office [phone] Fax [phone]  Notice: This dictation was prepared with Dragon dictation along with smaller phrase technology. Any transcriptional errors that result from this process are unintentional and may not be corrected upon review.  I spent 12 minutes examining this patient, reviewing medications, and using patient centered shared decision making involving her cardiac care.  Prior to her visit I spent greater than 20 minutes reviewing her past medical history,  medications, and prior cardiac tests.

## 2021-02-01 ENCOUNTER — Ambulatory Visit (INDEPENDENT_AMBULATORY_CARE_PROVIDER_SITE_OTHER): Payer: Medicare Other | Admitting: General Practice

## 2021-02-01 ENCOUNTER — Encounter: Payer: Self-pay | Admitting: General Practice

## 2021-02-01 ENCOUNTER — Other Ambulatory Visit: Payer: Self-pay

## 2021-02-01 VITALS — BP 138/62 | HR 73 | Ht 65.0 in | Wt 262.2 lb

## 2021-02-01 DIAGNOSIS — I5032 Chronic diastolic (congestive) heart failure: Secondary | ICD-10-CM | POA: Diagnosis not present

## 2021-02-01 DIAGNOSIS — E785 Hyperlipidemia, unspecified: Secondary | ICD-10-CM | POA: Diagnosis not present

## 2021-02-01 DIAGNOSIS — I10 Essential (primary) hypertension: Secondary | ICD-10-CM

## 2021-02-01 DIAGNOSIS — I4821 Permanent atrial fibrillation: Secondary | ICD-10-CM

## 2021-02-01 NOTE — Patient Instructions (Signed)
Medication Instructions:  The current medical regimen is effective;  continue present plan and medications as directed. Please refer to the Current Medication list given to you today.  *If you need a refill on your cardiac medications before your next appointment, please call your pharmacy*  Lab Work:   Testing/Procedures:  NONE    NONE  Special Instructions PLEASE READ AND FOLLOW SALTY 6-ATTACHED-1,800mg  daily  PLEASE INCREASE PHYSICAL ACTIVITY AS TOLERATED  PLEASE PURCHASE AND WEAR COMPRESSION STOCKINGS DAILY AND TAKE OFF AT BEDTIME. Compression stockings are elastic socks that squeeze the legs. They help to increase blood flow to the legs and to decrease swelling in the legs from fluid retention, and reduce the chance of developing blood clots in the lower legs. Please put on in the AM when dressing and off at night when dressing for bed.  LET THEM KNOW THAT YOU NEED KNEE HIGH'S WITH COMPRESSION OF 15-20 mmhg. [employer]  [place] [place] ([place] [phone]); [place], [place] [address]; [phone]  EMAIL   [email or web address].  PLEASE MAKE SURE TO ELEVATE YOUR FEET & LEGS WHILE SITTING, THIS WILL HELP WITH THE SWELLING ALSO.   Follow-Up: Your next appointment:  6 month(s) In Person with Kirk Ruths, MD OR IF UNAVAILABLE [place], FNP-C  At [facility], you and your health needs are our priority.  As part of our continuing mission to provide you with exceptional heart care, we have created designated Provider Care Teams.  These Care Teams include your primary Cardiologist (physician) and Advanced Practice Providers (APPs -  Physician Assistants and Nurse Practitioners) who all work together to provide you with the care you need, when you need it.            6 SALTY THINGS TO AVOID     1,800MG  DAILY

## 2021-02-15 DIAGNOSIS — I679 Cerebrovascular disease, unspecified: Secondary | ICD-10-CM | POA: Diagnosis not present

## 2021-02-15 DIAGNOSIS — F419 Anxiety disorder, unspecified: Secondary | ICD-10-CM | POA: Diagnosis not present

## 2021-02-15 DIAGNOSIS — R809 Proteinuria, unspecified: Secondary | ICD-10-CM | POA: Diagnosis not present

## 2021-02-15 DIAGNOSIS — E1129 Type 2 diabetes mellitus with other diabetic kidney complication: Secondary | ICD-10-CM | POA: Diagnosis not present

## 2021-02-15 DIAGNOSIS — I4819 Other persistent atrial fibrillation: Secondary | ICD-10-CM | POA: Diagnosis not present

## 2021-02-15 DIAGNOSIS — M1990 Unspecified osteoarthritis, unspecified site: Secondary | ICD-10-CM | POA: Diagnosis not present

## 2021-02-15 DIAGNOSIS — E1139 Type 2 diabetes mellitus with other diabetic ophthalmic complication: Secondary | ICD-10-CM | POA: Diagnosis not present

## 2021-02-15 DIAGNOSIS — I509 Heart failure, unspecified: Secondary | ICD-10-CM | POA: Diagnosis not present

## 2021-02-15 DIAGNOSIS — I10 Essential (primary) hypertension: Secondary | ICD-10-CM | POA: Diagnosis not present

## 2021-02-15 DIAGNOSIS — E041 Nontoxic single thyroid nodule: Secondary | ICD-10-CM | POA: Diagnosis not present

## 2021-02-15 DIAGNOSIS — D6869 Other thrombophilia: Secondary | ICD-10-CM | POA: Diagnosis not present

## 2021-02-16 ENCOUNTER — Telehealth: Payer: Self-pay | Admitting: General Practice

## 2021-02-16 NOTE — Telephone Encounter (Signed)
Patient states she went to her PCP and he did not hear afib. She states last time she was there 6 months ago he did not hear afib either. She would like to know when was the last time afib was detected. She states if she no longer has it she would like to discontinue the medication, because it costs her $300 after insurance.

## 2021-02-16 NOTE — Telephone Encounter (Signed)
Returned call to patient, advised patient that last EKG at [facility] with Dr. Stanford Breed was Afib (hx of perm afib?).  Discussed importance of Eliquis and advised to continue at this time.     Patient verbalized understanding.

## 2021-02-22 DIAGNOSIS — M25561 Pain in right knee: Secondary | ICD-10-CM | POA: Diagnosis not present

## 2021-02-22 DIAGNOSIS — M170 Bilateral primary osteoarthritis of knee: Secondary | ICD-10-CM | POA: Diagnosis not present

## 2021-02-22 DIAGNOSIS — M1712 Unilateral primary osteoarthritis, left knee: Secondary | ICD-10-CM | POA: Diagnosis not present

## 2021-02-22 DIAGNOSIS — M25562 Pain in left knee: Secondary | ICD-10-CM | POA: Diagnosis not present

## 2021-02-27 ENCOUNTER — Encounter: Payer: Self-pay | Admitting: Cardiology

## 2021-03-01 DIAGNOSIS — M1711 Unilateral primary osteoarthritis, right knee: Secondary | ICD-10-CM | POA: Diagnosis not present

## 2021-03-01 DIAGNOSIS — M25561 Pain in right knee: Secondary | ICD-10-CM | POA: Diagnosis not present

## 2021-03-06 DIAGNOSIS — M25551 Pain in right hip: Secondary | ICD-10-CM | POA: Diagnosis not present

## 2021-03-06 DIAGNOSIS — M545 Low back pain, unspecified: Secondary | ICD-10-CM | POA: Diagnosis not present

## 2021-03-08 DIAGNOSIS — M25562 Pain in left knee: Secondary | ICD-10-CM | POA: Diagnosis not present

## 2021-03-08 DIAGNOSIS — M1712 Unilateral primary osteoarthritis, left knee: Secondary | ICD-10-CM | POA: Diagnosis not present

## 2021-03-13 DIAGNOSIS — M545 Low back pain, unspecified: Secondary | ICD-10-CM | POA: Diagnosis not present

## 2021-03-13 DIAGNOSIS — M25551 Pain in right hip: Secondary | ICD-10-CM | POA: Diagnosis not present

## 2021-03-15 ENCOUNTER — Telehealth: Payer: Self-pay | Admitting: *Deleted

## 2021-03-15 NOTE — Telephone Encounter (Signed)
   [facility] Pre-operative Risk Assessment    Patient Name: Cynthia Frost  DOB: [birth year]  MRN: [id number]   HEARTCARE STAFF: - Please ensure there is not already an duplicate clearance open for this procedure. - Under Visit Info/Reason for Call, type in Other and utilize the format Clearance MM/DD/YY or Clearance TBD. Do not use dashes or single digits. - If request is for dental extraction, please clarify the # of teeth to be extracted.  Request for surgical clearance:  1. What type of surgery is being performed? PRP-platelet rich plasma-bilateral knees   2. When is this surgery scheduled? TBD   3. What type of clearance is required (medical clearance vs. Pharmacy clearance to hold med vs. Both)? both  4. Are there any medications that need to be held prior to surgery and how long?plavix-for 7 days prior and after procedure and also eliquis 3 days before and after procedure   5. Practice name and name of physician performing surgery? [facility]    6. What is the office phone number? [phone]   7.   What is the office fax number? [phone]  8.   Anesthesia type (None, local, MAC, general) ? Not listed   Fredia Beets 03/15/2021, 10:47 AM  _________________________________________________________________   (provider comments below)

## 2021-03-15 NOTE — Telephone Encounter (Signed)
Patient with diagnosis of afib on Eliquis for anticoagulation.    Procedure: PRP-platelet rich plasma-bilateral knees   Date of procedure: TBD  CHA2DS2-VASc Score = 5  This indicates a 7.2% annual risk of stroke. The patient's score is based upon: CHF History: Yes HTN History: Yes Diabetes History: Yes Stroke History: No Vascular Disease History: No Age Score: 1 Gender Score: 1   CrCl 80mL/min using adjusted body weight due to morbid obesity Platelet count 212K  Request is to hold Eliquis for 3 days prior to and after procedure. This is a low bleed risk procedure, typically do not hold anticoagulation at all for knee or hip injections. If a hold is needed, would recommend 1 day Eliquis hold prior to procedure and resuming within 24 hours after.

## 2021-03-15 NOTE — Telephone Encounter (Signed)
Pharmacy, can you please comment on how long Eliquis can be held for upcoming procedure? Thank you!  Regarding Plavix, patient is on this given history of right medial cerebral artery stenosis not CAD. Therefore, would defer recommendations for holding this to PCP.

## 2021-03-15 NOTE — Telephone Encounter (Signed)
   Name: Cynthia Frost  DOB: 1951-09-26  MRN: [id number]   Primary Cardiologist: Kirk Ruths, MD  Chart reviewed as part of pre-operative protocol coverage. Patient was contacted 03/15/2021 in reference to pre-operative risk assessment for pending surgery as outlined below.  Cynthia Frost was last seen on 02/01/21 by Coletta Memos, NP.  Since that day, Cynthia Frost has done well from a cardiac standpoint. She can easily complete 4 METs without anginal complaints.  Therefore, based on ACC/AHA guidelines, the patient would be at acceptable risk for the planned procedure without further cardiovascular testing.   The patient was advised that if she develops new symptoms prior to surgery to contact our office to arrange for a follow-up visit, and she verbalized understanding.  Per pharmacy recommendations, prefer patient does not interrupt anticoagulation for low bleeding risk procedures, however if necessary, can hold 1 day prior to and restart as soon as she is cleared to do so by her surgeon.   I will route this recommendation to the requesting party via Epic fax function and remove from pre-op pool. Please call with questions.  Abigail Butts, PA-C 03/15/2021, 3:53 PM

## 2021-03-26 DIAGNOSIS — Z23 Encounter for immunization: Secondary | ICD-10-CM | POA: Diagnosis not present

## 2021-05-05 DIAGNOSIS — H43392 Other vitreous opacities, left eye: Secondary | ICD-10-CM | POA: Diagnosis not present

## 2021-05-06 ENCOUNTER — Ambulatory Visit
Admission: EM | Admit: 2021-05-06 | Discharge: 2021-05-06 | Disposition: A | Discharge status: Home / Self Care | Payer: Medicare Other | Attending: Emergency Medicine | Admitting: Emergency Medicine

## 2021-05-06 ENCOUNTER — Encounter: Payer: Self-pay | Admitting: Emergency Medicine

## 2021-05-06 ENCOUNTER — Other Ambulatory Visit: Payer: Self-pay

## 2021-05-06 DIAGNOSIS — L089 Local infection of the skin and subcutaneous tissue, unspecified: Secondary | ICD-10-CM

## 2021-05-06 DIAGNOSIS — S60459A Superficial foreign body of unspecified finger, initial encounter: Secondary | ICD-10-CM

## 2021-05-06 MED ORDER — CEPHALEXIN 500 MG PO CAPS
500.0000 mg | ORAL_CAPSULE | Freq: Four times a day (QID) | ORAL | 0 refills | Status: AC
Start: 2021-05-06 — End: 2021-05-13

## 2021-05-06 NOTE — ED Provider Notes (Signed)
[facility]    CSN: [id number] Arrival date & time: 05/06/21  1019      History   Chief Complaint Chief Complaint  Patient presents with   Foreign Body in Skin    HPI Cynthia Frost is a 70 y.o. female history of A. fib, hypertension, DM type II, presenting today for evaluation of foreign body.  Reports splinter to right index finger for 2 days from a wicker basket.  Has had some soreness around the area.  Denies difficulty bending finger.  HPI  Past Medical History:  Diagnosis Date   Anxiety    Arthritis    Atrial fibrillation ([place])    Depression    Diabetes mellitus without complication ([place])    no meds yet   Full dentures    Hypertension    Hypertension    Joint pain    Lactose intolerance    Osteoarthritis    Palpitations    Prediabetes    Stenosis of right middle cerebral artery 2011   Wears glasses     Patient Active Problem List   Diagnosis Date Noted   Acute upper respiratory infection 11/01/2020   Diabetes mellitus ([place]) 05/24/2020   Essential hypertension 05/24/2020   Class 3 severe obesity with serious comorbidity and body mass index (BMI) of 40.0 to 44.9 in adult ([place]) 05/24/2020   Plantar fasciitis 06/16/2018   Stenosis of right middle cerebral artery     Past Surgical History:  Procedure Laterality Date   CEREBRAL ANGIOGRAM     COLONOSCOPY     DIAGNOSTIC LAPAROSCOPY     fibroid   MASS EXCISION Right 04/27/2014   Procedure: EXCISION MASS RIGHT RING FINGER DEBRIDEMENT PROXIMAL INTERPHALANGEAL JOINT RIGHT RING FINGER (PIP);  Surgeon: Wynonia Sours, MD;  Location: [place];  Service: Orthopedics;  Laterality: Right;    OB History     Gravida  3   Para      Term      Preterm      AB      Living         SAB      IAB      Ectopic      Multiple      Live Births               Home Medications    Prior to Admission medications   Medication Sig Start Date End Date Taking? Authorizing  Provider  cephALEXin (KEFLEX) 500 MG capsule Take 1 capsule (500 mg total) by mouth 4 (four) times daily for 7 days. 05/06/21 05/13/21 Yes Marranda Arakelian C, PA-C  amLODipine (NORVASC) 10 MG tablet TAKE 1 TABLET DAILY 07/31/20   Lelon Perla, MD  atorvastatin (LIPITOR) 10 MG tablet Take 10 mg by mouth daily.    [provider]  benzonatate (TESSALON) 100 MG capsule Take 1 capsule (100 mg total) by mouth every 8 (eight) hours. 11/01/20   Mayers, Cari S, PA-C  Boswellia-Glucosamine-Vit D (OSTEO BI-FLEX ONE PER DAY PO) Take by mouth. 2 tabs per day    [provider]  carvedilol (COREG) 25 MG tablet Take 0.5 tablets (12.5 mg total) by mouth daily. 02/19/17   Lelon Perla, MD  cetirizine (ZYRTEC ALLERGY) 10 MG tablet Take 1 tablet (10 mg total) by mouth daily. 06/09/20   Hall-Potvin, [place], PA-C  Cholecalciferol (VITAMIN D3) 75 MCG (3000 UT) TABS Take 1 tablet by mouth daily. 05/22/20   Whitmire, Joneen Boers, FNP  clopidogrel (PLAVIX) 75 MG tablet Take 75 mg by mouth daily.    [provider]  cyanocobalamin 100 MCG tablet Take 100 mcg by mouth daily.    [provider]  doxazosin (CARDURA) 8 MG tablet Take 8 mg by mouth daily.     [provider]  DULoxetine (CYMBALTA) 30 MG capsule Take 30 mg by mouth daily.    [provider]  ELIQUIS 5 MG TABS tablet TAKE 1 TABLET TWICE A DAY 09/07/20   Lelon Perla, MD  Emollient (COLLAGEN EX) Apply 3 g topically. 3 once per day    [provider]  furosemide (LASIX) 40 MG tablet Take 1 tablet (40 mg total) by mouth daily. 12/18/20 03/18/21  Lelon Perla, MD  Lido-Menthol-Methyl Sal-Camph (CBD KINGS EX) Place 2,000 mg under the tongue. CBD 2,000 once per day    [provider]  losartan (COZAAR) 100 MG tablet Take 1 tablet (100 mg total) by mouth daily. 12/18/20 03/18/21  Lelon Perla, MD    Family History Family History  Problem Relation Age of Onset   Dementia Mother     Hypertension Mother    Cancer Father    Obesity Father    Obesity Sister     Social History Social History   Tobacco Use   Smoking status: Never   Smokeless tobacco: Never  Vaping Use   Vaping Use: Never used  Substance Use Topics   Alcohol use: Yes    Comment: Occasional   Drug use: No     Allergies   Corticosteroids, Resource thickenup dairy [compleat], and Tomato   Review of Systems Review of Systems  Constitutional:  Negative for fatigue and fever.  HENT:  Negative for mouth sores.   Eyes:  Negative for visual disturbance.  Respiratory:  Negative for shortness of breath.   Cardiovascular:  Negative for chest pain.  Gastrointestinal:  Negative for abdominal pain, nausea and vomiting.  Genitourinary:  Negative for genital sores.  Musculoskeletal:  Negative for arthralgias and joint swelling.  Skin:  Positive for color change and wound. Negative for rash.  Neurological:  Negative for dizziness, weakness, light-headedness and headaches.    Physical Exam Triage Vital Signs ED Triage Vitals  Enc Vitals Group     BP 05/06/21 1222 (!) 143/70     Pulse Rate 05/06/21 1222 75     Resp 05/06/21 1222 18     Temp 05/06/21 1222 98.1 F (36.7 C)     Temp Source 05/06/21 1222 Oral     SpO2 05/06/21 1222 96 %     Weight --      Height --      Head Circumference --      Peak Flow --      Pain Score 05/06/21 1223 3     Pain Loc --      Pain Edu? --      Excl. in [place]? --    No data found.  Updated Vital Signs BP (!) 143/70 (BP Location: Left Arm)   Pulse 75   Temp 98.1 F (36.7 C) (Oral)   Resp 18   SpO2 96%   Visual Acuity Right Eye Distance:   Left Eye Distance:   Bilateral Distance:    Right Eye Near:   Left Eye Near:    Bilateral Near:     Physical Exam Vitals and nursing note reviewed.  Constitutional:      Appearance: She is well-developed.  Comments: No acute distress  HENT:     Head: Normocephalic and atraumatic.     Nose: Nose normal.   Eyes:     Conjunctiva/sclera: Conjunctivae normal.  Cardiovascular:     Rate and Rhythm: Normal rate.  Pulmonary:     Effort: Pulmonary effort is normal. No respiratory distress.  Abdominal:     General: There is no distension.  Musculoskeletal:        General: Normal range of motion.     Cervical back: Neck supple.  Skin:    General: Skin is warm and dry.     Comments: Palmar surface of right index finger with small foreign body present over radial aspect of proximal phalanx, after removal pustular drainage present  Neurological:     Mental Status: She is alert and oriented to person, place, and time.     UC Treatments / Results  Labs (all labs ordered are listed, but only abnormal results are displayed) Labs Reviewed - No data to display  EKG   Radiology No results found.  Procedures Foreign Body Removal  Date/Time: 05/06/2021 12:46 PM Performed by: Jaasiel [place], [place] C, PA-C Authorized by: Joshuwa Vecchio, Elesa Hacker, PA-C   Consent:    Consent obtained:  Verbal   Consent given by:  Patient and parent   Risks discussed:  Bleeding, pain and incomplete removal   Alternatives discussed:  No treatment Universal protocol:    Patient identity confirmed:  Verbally with patient Location:    Location:  Finger   Finger location:  R index finger   Depth:  Intradermal   Tendon involvement:  None Pre-procedure details:    Imaging:  None Anesthesia:    Anesthesia method:  None Procedure type:    Procedure complexity:  Simple Procedure details:    Localization method:  Probed and visualized   Dissection of underlying tissues: no     Bloodless field: yes     Removal mechanism:  Forceps   Foreign bodies recovered:  1   Description:  Small wooden foreign body   Intact foreign body removal: yes   Post-procedure details:    Neurovascular status: intact     Confirmation:  No additional foreign bodies on visualization   Skin closure:  None   Dressing:  Non-adherent dressing    Procedure completion:  Tolerated well, no immediate complications (including critical care time)  Medications Ordered in UC Medications - No data to display  Initial Impression / Assessment and Plan / UC Course  I have reviewed the triage vital signs and the nursing notes.  Pertinent labs & imaging results that were available during my care of the patient were reviewed by me and considered in my medical decision making (see chart for details).     Splinter removed with forceps without any complications, does have pustular drainage, placing on Keflex, discussed wound care and monitor for gradual resolution.  Discussed strict return precautions. Patient verbalized understanding and is agreeable with plan.  Final Clinical Impressions(s) / UC Diagnoses   Final diagnoses:  Superficial foreign body (splinter) of fingers, without major open wound, infected, initial encounter     Discharge Instructions      Splinter removed Begin Keflex 4 times daily for 1 week to help with infection Keep wound clean and dry Follow-up for any concerns     ED Prescriptions     Medication Sig Dispense Auth. Provider   cephALEXin (KEFLEX) 500 MG capsule Take 1 capsule (500 mg total) by mouth 4 (four) times  daily for 7 days. 28 capsule Niemah Schwebke, [place] C, PA-C      PDMP not reviewed this encounter.   Janith Lima, [place] 05/06/21 1247

## 2021-05-06 NOTE — ED Triage Notes (Signed)
Pt here for splinter in right index finger x 2 days from [employer]

## 2021-05-06 NOTE — Discharge Instructions (Addendum)
Splinter removed Begin Keflex 4 times daily for 1 week to help with infection Keep wound clean and dry Follow-up for any concerns

## 2021-06-15 ENCOUNTER — Telehealth: Payer: Self-pay | Admitting: *Deleted

## 2021-06-15 NOTE — Telephone Encounter (Signed)
PA for eliquis complete and approved until 06/14/22.

## 2021-07-31 ENCOUNTER — Ambulatory Visit: Payer: POS | Admitting: Cardiology

## 2021-08-03 ENCOUNTER — Ambulatory Visit: Payer: POS | Admitting: Cardiology

## 2021-08-17 DIAGNOSIS — Z23 Encounter for immunization: Secondary | ICD-10-CM | POA: Diagnosis not present

## 2021-08-20 DIAGNOSIS — M13872 Other specified arthritis, left ankle and foot: Secondary | ICD-10-CM | POA: Diagnosis not present

## 2021-08-20 DIAGNOSIS — M2142 Flat foot [pes planus] (acquired), left foot: Secondary | ICD-10-CM | POA: Diagnosis not present

## 2021-08-20 DIAGNOSIS — M25572 Pain in left ankle and joints of left foot: Secondary | ICD-10-CM | POA: Diagnosis not present

## 2021-09-10 ENCOUNTER — Other Ambulatory Visit: Payer: Self-pay | Admitting: Cardiology

## 2021-09-10 DIAGNOSIS — I4819 Other persistent atrial fibrillation: Secondary | ICD-10-CM | POA: Diagnosis not present

## 2021-09-10 DIAGNOSIS — E1129 Type 2 diabetes mellitus with other diabetic kidney complication: Secondary | ICD-10-CM | POA: Diagnosis not present

## 2021-09-10 DIAGNOSIS — I4891 Unspecified atrial fibrillation: Secondary | ICD-10-CM

## 2021-09-10 DIAGNOSIS — F39 Unspecified mood [affective] disorder: Secondary | ICD-10-CM | POA: Diagnosis not present

## 2021-09-10 DIAGNOSIS — Z1389 Encounter for screening for other disorder: Secondary | ICD-10-CM | POA: Diagnosis not present

## 2021-09-10 DIAGNOSIS — Z0000 Encounter for general adult medical examination without abnormal findings: Secondary | ICD-10-CM | POA: Diagnosis not present

## 2021-09-10 DIAGNOSIS — E113293 Type 2 diabetes mellitus with mild nonproliferative diabetic retinopathy without macular edema, bilateral: Secondary | ICD-10-CM | POA: Diagnosis not present

## 2021-09-10 DIAGNOSIS — F419 Anxiety disorder, unspecified: Secondary | ICD-10-CM | POA: Diagnosis not present

## 2021-09-10 DIAGNOSIS — I679 Cerebrovascular disease, unspecified: Secondary | ICD-10-CM | POA: Diagnosis not present

## 2021-09-10 DIAGNOSIS — R809 Proteinuria, unspecified: Secondary | ICD-10-CM | POA: Diagnosis not present

## 2021-09-10 DIAGNOSIS — E1139 Type 2 diabetes mellitus with other diabetic ophthalmic complication: Secondary | ICD-10-CM | POA: Diagnosis not present

## 2021-09-10 DIAGNOSIS — I509 Heart failure, unspecified: Secondary | ICD-10-CM | POA: Diagnosis not present

## 2021-09-10 DIAGNOSIS — I10 Essential (primary) hypertension: Secondary | ICD-10-CM | POA: Diagnosis not present

## 2021-09-10 DIAGNOSIS — Z23 Encounter for immunization: Secondary | ICD-10-CM | POA: Diagnosis not present

## 2021-09-10 NOTE — Telephone Encounter (Signed)
Prescription refill request for Eliquis received. Last office visit:[place] 02/01/21 Scr: 0.72 12/22/20 Age: 75f Weight:118.9kg

## 2021-09-29 ENCOUNTER — Other Ambulatory Visit: Payer: Self-pay

## 2021-09-29 ENCOUNTER — Encounter: Payer: Self-pay | Admitting: Emergency Medicine

## 2021-09-29 ENCOUNTER — Emergency Department (HOSPITAL_COMMUNITY): Payer: Medicare Other

## 2021-09-29 ENCOUNTER — Emergency Department (HOSPITAL_COMMUNITY)
Admission: EM | Admit: 2021-09-29 | Discharge: 2021-09-29 | Disposition: A | Discharge status: Home / Self Care | Payer: Medicare Other | Attending: Emergency Medicine | Admitting: Emergency Medicine

## 2021-09-29 ENCOUNTER — Ambulatory Visit
Admission: EM | Admit: 2021-09-29 | Discharge: 2021-09-29 | Disposition: A | Discharge status: Home / Self Care | Payer: Medicare Other | Attending: Internal Medicine | Admitting: Internal Medicine

## 2021-09-29 DIAGNOSIS — R051 Acute cough: Secondary | ICD-10-CM

## 2021-09-29 DIAGNOSIS — R0789 Other chest pain: Secondary | ICD-10-CM

## 2021-09-29 DIAGNOSIS — E119 Type 2 diabetes mellitus without complications: Secondary | ICD-10-CM | POA: Diagnosis not present

## 2021-09-29 DIAGNOSIS — R059 Cough, unspecified: Secondary | ICD-10-CM | POA: Diagnosis not present

## 2021-09-29 DIAGNOSIS — Z7901 Long term (current) use of anticoagulants: Secondary | ICD-10-CM | POA: Insufficient documentation

## 2021-09-29 DIAGNOSIS — Z79899 Other long term (current) drug therapy: Secondary | ICD-10-CM | POA: Diagnosis not present

## 2021-09-29 DIAGNOSIS — Z7902 Long term (current) use of antithrombotics/antiplatelets: Secondary | ICD-10-CM | POA: Insufficient documentation

## 2021-09-29 DIAGNOSIS — I10 Essential (primary) hypertension: Secondary | ICD-10-CM | POA: Diagnosis not present

## 2021-09-29 DIAGNOSIS — J40 Bronchitis, not specified as acute or chronic: Secondary | ICD-10-CM | POA: Insufficient documentation

## 2021-09-29 DIAGNOSIS — Z20822 Contact with and (suspected) exposure to covid-19: Secondary | ICD-10-CM | POA: Insufficient documentation

## 2021-09-29 DIAGNOSIS — R0602 Shortness of breath: Secondary | ICD-10-CM | POA: Diagnosis not present

## 2021-09-29 LAB — CBC WITH DIFFERENTIAL/PLATELET
Abs Immature Granulocytes: 0.03 10*3/uL (ref 0.00–0.07)
Basophils Absolute: 0.1 10*3/uL (ref 0.0–0.1)
Basophils Relative: 1 %
Eosinophils Absolute: 0.4 10*3/uL (ref 0.0–0.5)
Eosinophils Relative: 5 %
HCT: 37.7 % (ref 36.0–46.0)
Hemoglobin: 12.3 g/dL (ref 12.0–15.0)
Immature Granulocytes: 0 %
Lymphocytes Relative: 25 %
Lymphs Abs: 2.1 10*3/uL (ref 0.7–4.0)
MCH: 30.1 pg (ref 26.0–34.0)
MCHC: 32.6 g/dL (ref 30.0–36.0)
MCV: 92.4 fL (ref 80.0–100.0)
Monocytes Absolute: 0.8 10*3/uL (ref 0.1–1.0)
Monocytes Relative: 9 %
Neutro Abs: 5.1 10*3/uL (ref 1.7–7.7)
Neutrophils Relative %: 60 %
Platelets: 265 10*3/uL (ref 150–400)
RBC: 4.08 MIL/uL (ref 3.87–5.11)
RDW: 15.9 % — ABNORMAL HIGH (ref 11.5–15.5)
WBC: 8.6 10*3/uL (ref 4.0–10.5)
nRBC: 0 % (ref 0.0–0.2)

## 2021-09-29 LAB — RESP PANEL BY RT-PCR (FLU A&B, COVID) ARPGX2
Influenza A by PCR: NEGATIVE
Influenza B by PCR: NEGATIVE
SARS Coronavirus 2 by RT PCR: NEGATIVE

## 2021-09-29 LAB — COMPREHENSIVE METABOLIC PANEL
ALT: 21 U/L (ref 0–44)
AST: 24 U/L (ref 15–41)
Albumin: 3.9 g/dL (ref 3.5–5.0)
Alkaline Phosphatase: 90 U/L (ref 38–126)
Anion gap: 6 (ref 5–15)
BUN: 15 mg/dL (ref 8–23)
CO2: 29 mmol/L (ref 22–32)
Calcium: 9.3 mg/dL (ref 8.9–10.3)
Chloride: 104 mmol/L (ref 98–111)
Creatinine, Ser: 0.7 mg/dL (ref 0.44–1.00)
GFR, Estimated: >60 mL/min (ref >60)
Glucose, Bld: 109 mg/dL — ABNORMAL HIGH (ref 70–99)
Potassium: 3.3 mmol/L — ABNORMAL LOW (ref 3.5–5.1)
Sodium: 139 mmol/L (ref 135–145)
Total Bilirubin: 0.9 mg/dL (ref 0.3–1.2)
Total Protein: 7.5 g/dL (ref 6.5–8.1)

## 2021-09-29 LAB — BRAIN NATRIURETIC PEPTIDE: B Natriuretic Peptide: 206.5 pg/mL — ABNORMAL HIGH (ref 0.0–100.0)

## 2021-09-29 LAB — TROPONIN I (HIGH SENSITIVITY): Troponin I (High Sensitivity): 3 ng/L

## 2021-09-29 MED ORDER — AZITHROMYCIN 250 MG PO TABS: ORAL_TABLET | ORAL | 0 refills | Status: DC

## 2021-09-29 NOTE — ED Notes (Signed)
Patient is being discharged from the Urgent Care and sent to the Emergency Department via POV. Per Oswaldo Conroy NP, patient is in need of higher level of care due to shortness of breath, chest pressure. Patient is aware and verbalizes understanding of plan of care.  Vitals:   09/29/21 1509  Pulse: 73  Resp: 20  Temp: 98.2 F (36.8 C)  SpO2: 94%

## 2021-09-29 NOTE — Discharge Instructions (Signed)
Please go to the hospital as soon as you leave urgent care for further evaluation and management. 

## 2021-09-29 NOTE — ED Triage Notes (Addendum)
Has had a cough with chest congestion since 09/18/2021. States it's interfereing with her ADLs and sleep due to the rattling in her chest and shortness of breath. Hx of bronchitis, has been using her inhaler to help without improvement. O2 on ambulation dropped to 92% in triage, rose to 96% at rest. Denies fever, NVD. Reports that over the last week she's felt a pressure in her chest that radiates through into her middle back that is worsened with activity and breathing, eating does not affect pain, rates it a 6-7/10 pressure like sensation. Hx of a. Fib.

## 2021-09-29 NOTE — ED Provider Notes (Signed)
[facility]    CSN: [id number] Arrival date & time: 09/29/21  1327      History   Chief Complaint Chief Complaint  Patient presents with   Cough   Shortness of Breath    HPI Cynthia Frost is a 70 y.o. female.   Patient presents with productive cough with clear to yellow sputum and chest congestion that has been present since 09/18/21.  Patient also endorses some chest pain and shortness of breath.  Patient describes the chest pain as "someone sitting on her chest".  Denies radiation of chest pressure or pain.  Denies nasal congestion, sore throat, ear pain, headache, blurred vision, dizziness, nausea, vomiting, diarrhea, abdominal pain.  Denies any known fevers or sick contacts.  Patient has been taking over-the-counter cough and cold medications with no improvement in symptoms.  Patient reports history of bronchitis.  Patient also has history of asthma and has been using her albuterol inhaler with no improvement in shortness of breath.  Patient does have persistent atrial fibrillation and takes Eliquis.   Cough Shortness of Breath  Past Medical History:  Diagnosis Date   Anxiety    Arthritis    Atrial fibrillation ([place])    Depression    Diabetes mellitus without complication ([place])    no meds yet   Full dentures    Hypertension    Hypertension    Joint pain    Lactose intolerance    Osteoarthritis    Palpitations    Prediabetes    Stenosis of right middle cerebral artery 2011   Wears glasses     Patient Active Problem List   Diagnosis Date Noted   Acute upper respiratory infection 11/01/2020   Diabetes mellitus ([place]) 05/24/2020   Essential hypertension 05/24/2020   Class 3 severe obesity with serious comorbidity and body mass index (BMI) of 40.0 to 44.9 in adult ([place]) 05/24/2020   Plantar fasciitis 06/16/2018   Stenosis of right middle cerebral artery     Past Surgical History:  Procedure Laterality Date   CEREBRAL ANGIOGRAM     COLONOSCOPY      DIAGNOSTIC LAPAROSCOPY     fibroid   MASS EXCISION Right 04/27/2014   Procedure: EXCISION MASS RIGHT RING FINGER DEBRIDEMENT PROXIMAL INTERPHALANGEAL JOINT RIGHT RING FINGER (PIP);  Surgeon: Wynonia Sours, MD;  Location: [place];  Service: Orthopedics;  Laterality: Right;    OB History     Gravida  3   Para      Term      Preterm      AB      Living         SAB      IAB      Ectopic      Multiple      Live Births               Home Medications    Prior to Admission medications   Medication Sig Start Date End Date Taking? Authorizing Provider  amLODipine (NORVASC) 10 MG tablet TAKE 1 TABLET DAILY 07/31/20   Lelon Perla, MD  apixaban (ELIQUIS) 5 MG TABS tablet TAKE 1 TABLET TWICE A DAY 09/10/21   Deberah Pelton, NP  atorvastatin (LIPITOR) 10 MG tablet Take 10 mg by mouth daily.    [provider]  benzonatate (TESSALON) 100 MG capsule Take 1 capsule (100 mg total) by mouth every 8 (eight) hours. 11/01/20   Mayers, Loraine Grip, PA-C  Boswellia-Glucosamine-Vit D (OSTEO  BI-FLEX ONE PER DAY PO) Take by mouth. 2 tabs per day    [provider]  carvedilol (COREG) 25 MG tablet Take 0.5 tablets (12.5 mg total) by mouth daily. 02/19/17   Lelon Perla, MD  cetirizine (ZYRTEC ALLERGY) 10 MG tablet Take 1 tablet (10 mg total) by mouth daily. 06/09/20   Hall-Potvin, [place], PA-C  Cholecalciferol (VITAMIN D3) 75 MCG (3000 UT) TABS Take 1 tablet by mouth daily. 05/22/20   [place]mire, Joneen Boers, FNP  clopidogrel (PLAVIX) 75 MG tablet Take 75 mg by mouth daily.    [provider]  cyanocobalamin 100 MCG tablet Take 100 mcg by mouth daily.    [provider]  doxazosin (CARDURA) 8 MG tablet Take 8 mg by mouth daily.     [provider]  DULoxetine (CYMBALTA) 30 MG capsule Take 30 mg by mouth daily.    [provider]  Emollient (COLLAGEN EX) Apply 3 g topically. 3 once per day    [provider]   furosemide (LASIX) 40 MG tablet Take 1 tablet (40 mg total) by mouth daily. 12/18/20 03/18/21  Lelon Perla, MD  Lido-Menthol-Methyl Sal-Camph (CBD KINGS EX) Place 2,000 mg under the tongue. CBD 2,000 once per day    [provider]  losartan (COZAAR) 100 MG tablet Take 1 tablet (100 mg total) by mouth daily. 12/18/20 03/18/21  Lelon Perla, MD    Family History Family History  Problem Relation Age of Onset   Dementia Mother    Hypertension Mother    Cancer Father    Obesity Father    Obesity Sister     Social History Social History   Tobacco Use   Smoking status: Never   Smokeless tobacco: Never  Vaping Use   Vaping Use: Never used  Substance Use Topics   Alcohol use: Yes    Comment: Occasional   Drug use: No     Allergies   Corticosteroids, Resource thickenup dairy [compleat], and Tomato   Review of Systems Review of Systems Per HPI  Physical Exam Triage Vital Signs ED Triage Vitals  Enc Vitals Group     BP --      Pulse Rate 09/29/21 1509 73     Resp 09/29/21 1509 20     Temp 09/29/21 1509 98.2 F (36.8 C)     Temp Source 09/29/21 1509 Oral     SpO2 09/29/21 1509 94 %     Weight --      Height --      Head Circumference --      Peak Flow --      Pain Score 09/29/21 1512 6     Pain Loc --      Pain Edu? --      Excl. in [place]? --    No data found.  Updated Vital Signs Pulse 73   Temp 98.2 F (36.8 C) (Oral)   Resp 20   SpO2 94%   Visual Acuity Right Eye Distance:   Left Eye Distance:   Bilateral Distance:    Right Eye Near:   Left Eye Near:    Bilateral Near:     Physical Exam Constitutional:      General: She is not in acute distress.    Appearance: Normal appearance. She is not toxic-appearing or diaphoretic.  HENT:     Head: Normocephalic and atraumatic.     Right Ear: Tympanic membrane and ear canal normal.     Left  Ear: Tympanic membrane and ear canal normal.     Nose: Congestion present.     Mouth/Throat:      Mouth: Mucous membranes are moist.     Pharynx: No posterior oropharyngeal erythema.  Eyes:     Extraocular Movements: Extraocular movements intact.     Conjunctiva/sclera: Conjunctivae normal.     Pupils: Pupils are equal, round, and reactive to light.  Cardiovascular:     Rate and Rhythm: Normal rate and regular rhythm.     Pulses: Normal pulses.     Heart sounds: Normal heart sounds.  Pulmonary:     Effort: Pulmonary effort is normal. No respiratory distress.     Breath sounds: No stridor. Wheezing and rhonchi present. No rales.  Abdominal:     General: Abdomen is flat. Bowel sounds are normal.     Palpations: Abdomen is soft.  Musculoskeletal:        General: Normal range of motion.     Cervical back: Normal range of motion.  Skin:    General: Skin is warm and dry.  Neurological:     General: No focal deficit present.     Mental Status: She is alert and oriented to person, place, and time. Mental status is at baseline.  Psychiatric:        Mood and Affect: Mood normal.        Behavior: Behavior normal.     UC Treatments / Results  Labs (all labs ordered are listed, but only abnormal results are displayed) Labs Reviewed - No data to display  EKG   Radiology No results found.  Procedures Procedures (including critical care time)  Medications Ordered in UC Medications - No data to display  Initial Impression / Assessment and Plan / UC Course  I have reviewed the triage vital signs and the nursing notes.  Pertinent labs & imaging results that were available during my care of the patient were reviewed by me and considered in my medical decision making (see chart for details).     EKG completed showing A. fib, but upon further review of chart it appears that patient is in persistent atrial fibrillation.  Differential diagnoses include viral bronchitis, community-acquired pneumonia, PE, viral upper respiratory infection.  It appears as patient symptoms could be  viral in etiology.  Although patient is having chest pain and pressure, shortness of breath, and oxygen saturation is ranging from 92-94 percent.  This warrants further evaluation and management at the hospital.  Patient was offered EMS transport but declined.  Risks associated with not going to the hospital via EMS were discussed with patient.  Patient voiced understanding.  Patient was agreeable with plan.  Patient left via self transport to the hospital. Final Clinical Impressions(s) / UC Diagnoses   Final diagnoses:  Shortness of breath  Other chest pain  Acute cough     Discharge Instructions      Please go to the hospital as soon as you leave urgent care for further evaluation and management.    ED Prescriptions   None    PDMP not reviewed this encounter.   Teodora Medici, [place] 09/29/21 [phone]

## 2021-09-29 NOTE — ED Triage Notes (Signed)
Patient reports cough/congestion since 11/8. Patient taking otc medications with no relief. Patient went to UC today and was referred to hospital for treatment/evaluation.

## 2021-09-29 NOTE — ED Provider Notes (Signed)
[name] Provider Note  CSN: [id number] Arrival date & time: 09/29/21 1738    History Chief Complaint  Patient presents with   Shortness of Breath    Cynthia Frost is a 70 y.o. female with history of pAF on Eliquis reports about a week of cough, productive of clear sputum, chills but no fever, tightness with coughing and walking, improved with albuterol. She has had bronchitis in the past that has felt similar. Went to UC and sent to the ED for evaluation.    Past Medical History:  Diagnosis Date   Anxiety    Arthritis    Atrial fibrillation ([place])    Depression    Diabetes mellitus without complication ([place])    no meds yet   Full dentures    Hypertension    Hypertension    Joint pain    Lactose intolerance    Osteoarthritis    Palpitations    Prediabetes    Stenosis of right middle cerebral artery 2011   Wears glasses     Past Surgical History:  Procedure Laterality Date   CEREBRAL ANGIOGRAM     COLONOSCOPY     DIAGNOSTIC LAPAROSCOPY     fibroid   MASS EXCISION Right 04/27/2014   Procedure: EXCISION MASS RIGHT RING FINGER DEBRIDEMENT PROXIMAL INTERPHALANGEAL JOINT RIGHT RING FINGER (PIP);  Surgeon: Wynonia Sours, MD;  Location: [facility];  Service: Orthopedics;  Laterality: Right;    Family History  Problem Relation Age of Onset   Dementia Mother    Hypertension Mother    Cancer Father    Obesity Father    Obesity Sister     Social History   Tobacco Use   Smoking status: Never   Smokeless tobacco: Never  Vaping Use   Vaping Use: Never used  Substance Use Topics   Alcohol use: Yes    Comment: Occasional   Drug use: No     Home Medications Prior to Admission medications   Medication Sig Start Date End Date Taking? Authorizing Provider  amLODipine (NORVASC) 10 MG tablet TAKE 1 TABLET DAILY 07/31/20   Lelon Perla, MD  apixaban (ELIQUIS) 5 MG TABS tablet TAKE 1 TABLET TWICE A DAY 09/10/21   Deberah Pelton, NP  atorvastatin (LIPITOR) 10 MG tablet Take 10 mg by mouth daily.    [provider]  azithromycin (ZITHROMAX) 250 MG tablet Use as directed 09/29/21  Yes Truddie Hidden, MD  benzonatate (TESSALON) 100 MG capsule Take 1 capsule (100 mg total) by mouth every 8 (eight) hours. 11/01/20   Mayers, Cari S, PA-C  Boswellia-Glucosamine-Vit D (OSTEO BI-FLEX ONE PER DAY PO) Take by mouth. 2 tabs per day    [provider]  carvedilol (COREG) 25 MG tablet Take 0.5 tablets (12.5 mg total) by mouth daily. 02/19/17   Lelon Perla, MD  cetirizine (ZYRTEC ALLERGY) 10 MG tablet Take 1 tablet (10 mg total) by mouth daily. 06/09/20   Hall-Potvin, [place], PA-C  Cholecalciferol (VITAMIN D3) 75 MCG (3000 UT) TABS Take 1 tablet by mouth daily. 05/22/20   Whitmire, Joneen Boers, FNP  clopidogrel (PLAVIX) 75 MG tablet Take 75 mg by mouth daily.    [provider]  cyanocobalamin 100 MCG tablet Take 100 mcg by mouth daily.    [provider]  doxazosin (CARDURA) 8 MG tablet Take 8 mg by mouth daily.     [provider]  DULoxetine (CYMBALTA) 30 MG capsule Take 30  mg by mouth daily.    [provider]  Emollient (COLLAGEN EX) Apply 3 g topically. 3 once per day    [provider]  furosemide (LASIX) 40 MG tablet Take 1 tablet (40 mg total) by mouth daily. 12/18/20 03/18/21  Lelon Perla, MD  Lido-Menthol-Methyl Sal-Camph (CBD KINGS EX) Place 2,000 mg under the tongue. CBD 2,000 once per day    [provider]  losartan (COZAAR) 100 MG tablet Take 1 tablet (100 mg total) by mouth daily. 12/18/20 03/18/21  Lelon Perla, MD     Allergies    Corticosteroids, Resource thickenup dairy [compleat], and Tomato   Review of Systems   Review of Systems A comprehensive review of systems was completed and negative except as noted in HPI.    Physical Exam BP (!) 145/77   Pulse 78   Temp 98.4 F (36.9 C) (Oral)   Resp 19   Ht 5\' 4"  (1.626  m)   Wt 113.4 kg   SpO2 94%   BMI 42.91 kg/m   Physical Exam Vitals and nursing note reviewed.  Constitutional:      Appearance: Normal appearance.  HENT:     Head: Normocephalic and atraumatic.     Nose: Nose normal.     Mouth/Throat:     Mouth: Mucous membranes are moist.  Eyes:     Extraocular Movements: Extraocular movements intact.     Conjunctiva/sclera: Conjunctivae normal.  Cardiovascular:     Rate and Rhythm: Normal rate.  Pulmonary:     Effort: Pulmonary effort is normal.     Breath sounds: Normal breath sounds. No wheezing, rhonchi or rales.  Abdominal:     General: Abdomen is flat.     Palpations: Abdomen is soft.     Tenderness: There is no abdominal tenderness.  Musculoskeletal:        General: No swelling. Normal range of motion.     Cervical back: Neck supple.  Skin:    General: Skin is warm and dry.  Neurological:     General: No focal deficit present.     Mental Status: She is alert.  Psychiatric:        Mood and Affect: Mood normal.     ED Results / Procedures / Treatments   Labs (all labs ordered are listed, but only abnormal results are displayed) Labs Reviewed  COMPREHENSIVE METABOLIC PANEL - Abnormal; Notable for the following components:      Result Value   Potassium 3.3 (*)    Glucose, Bld 109 (*)    All other components within normal limits  CBC WITH DIFFERENTIAL/PLATELET - Abnormal; Notable for the following components:   RDW 15.9 (*)    All other components within normal limits  BRAIN NATRIURETIC PEPTIDE - Abnormal; Notable for the following components:   B Natriuretic Peptide 206.5 (*)    All other components within normal limits  RESP PANEL BY RT-PCR (FLU A&B, COVID) ARPGX2  TROPONIN I (HIGH SENSITIVITY)  TROPONIN I (HIGH SENSITIVITY)    EKG EKG Interpretation  Date/Time:  Saturday September 29 2021 19:50:44 EST Ventricular Rate:  72 PR Interval:    QRS Duration: 91 QT Interval:  486 QTC Calculation: 532 R  Axis:   4 Text Interpretation: Atrial fibrillation Low voltage, precordial leads Borderline repolarization abnormality No significant change since last tracing Confirmed by Calvert Cantor [phone]) on 09/29/2021 10:35:19 PM  Radiology DG Chest 2 View  Result Date: 09/29/2021 CLINICAL DATA:  Cough, short of breath,  dyspnea on exertion for 1 week EXAM: CHEST - 2 VIEW COMPARISON:  12/04/2020 FINDINGS: Frontal and lateral views of the chest demonstrate a stable cardiac silhouette. No acute airspace disease, effusion, or pneumothorax. No acute bony abnormality. IMPRESSION: 1. Stable chest, no acute process. Electronically Signed   By: Randa Ngo M.D.   On: 09/29/2021 18:46    Procedures Procedures  Medications Ordered in the ED Medications - No data to display   MDM Rules/Calculators/A&P MDM Patient with URI symptoms, consistent with bronchitis like she has had before. She has an inhaler at home which has helped. She just wants an antibiotic as that has helped her in the past. Given her other medical problems and worsening symptoms, will d/c with Z-pak. She does not tolerate steroids due to her pre-diabetes. PCP follow up.   ED Course  I have reviewed the triage vital signs and the nursing notes.  Pertinent labs & imaging results that were available during my care of the patient were reviewed by me and considered in my medical decision making (see chart for details).     Final Clinical Impression(s) / ED Diagnoses Final diagnoses:  Bronchitis    Rx / DC Orders ED Discharge Orders          Ordered    azithromycin (ZITHROMAX) 250 MG tablet        09/29/21 2253             Truddie Hidden, MD 09/29/21 [phone]

## 2021-09-29 NOTE — ED Provider Notes (Signed)
Emergency Medicine Provider Triage Evaluation Note  Cynthia Frost , a 70 y.o. female  was evaluated in triage.  Pt complains of  cough, chest tightness and trouble sleeping.  This has been going on for one week.  She reports dyspnea with exertion. Fatigue with exertion.   No known fevers.   Review of Systems  See above  Physical Exam  Pulse 78   Temp 98.4 F (36.9 C) (Oral)   Resp 19   Ht 5\' 4"  (1.626 m)   Wt 113.4 kg   SpO2 97%   BMI 42.91 kg/m  Gen:   Awake, no distress   Resp:  Normal effort, occasional cough MSK:   Moves extremities without difficulty  Other:  Normal speech.  Generally well-appearing.  Medical Decision Making  Medically screening exam initiated at 6:08 PM.  Appropriate orders placed.  Cyrah Mclamb was informed that the remainder of the evaluation will be completed by another provider, this initial triage assessment does not replace that evaluation, and the importance of remaining in the ED until their evaluation is complete.     Lorin Glass, PA-C 09/29/21 1812    Carmin Muskrat, MD 09/30/21 2139

## 2021-10-09 DIAGNOSIS — Z20822 Contact with and (suspected) exposure to covid-19: Secondary | ICD-10-CM | POA: Diagnosis not present

## 2021-10-24 DIAGNOSIS — R062 Wheezing: Secondary | ICD-10-CM | POA: Diagnosis not present

## 2021-10-24 DIAGNOSIS — I10 Essential (primary) hypertension: Secondary | ICD-10-CM | POA: Diagnosis not present

## 2021-10-24 DIAGNOSIS — R059 Cough, unspecified: Secondary | ICD-10-CM | POA: Diagnosis not present

## 2021-10-27 DIAGNOSIS — Z1231 Encounter for screening mammogram for malignant neoplasm of breast: Secondary | ICD-10-CM | POA: Diagnosis not present

## 2021-11-28 NOTE — Progress Notes (Signed)
HPI: FU atrial fibrillation and dyspnea. Patient states she was told 20 years ago that she may have had a heart attack. This was in [place]. No records available. Nuclear study December 2017 showed ejection fraction 51% and no ischemia or infarction. At previous ov, pt noted to be in atrial fibrillation and she elected not to pursue cardioversion.  Echocardiogram February 2022 showed normal LV function, mild left ventricular hypertrophy, moderate pulmonary hypertension, moderate left atrial enlargement, mild mitral regurgitation.  Since last seen she has some dyspnea on exertion that she attributes to her weight.  No orthopnea, PND, pedal edema, chest pain, palpitations, syncope or bleeding.  Current Outpatient Medications  Medication Sig Dispense Refill   amLODipine (NORVASC) 10 MG tablet TAKE 1 TABLET DAILY 90 tablet 3   apixaban (ELIQUIS) 5 MG TABS tablet TAKE 1 TABLET TWICE A DAY 180 tablet 1   atorvastatin (LIPITOR) 10 MG tablet Take 10 mg by mouth daily.     Boswellia-Glucosamine-Vit D (OSTEO BI-FLEX ONE PER DAY PO) Take by mouth. 2 tabs per day     carvedilol (COREG) 25 MG tablet Take 0.5 tablets (12.5 mg total) by mouth daily. 30 tablet 2   cetirizine (ZYRTEC ALLERGY) 10 MG tablet Take 1 tablet (10 mg total) by mouth daily. (Patient taking differently: Take 10 mg by mouth as needed.) 30 tablet 0   Cholecalciferol (VITAMIN D3) 75 MCG (3000 UT) TABS Take 1 tablet by mouth daily. 30 tablet 0   clopidogrel (PLAVIX) 75 MG tablet Take 75 mg by mouth daily.     Coenzyme Q10 (CO Q 10 PO) Take by mouth daily.     cyanocobalamin 100 MCG tablet Take 100 mcg by mouth daily.     doxazosin (CARDURA) 8 MG tablet Take 8 mg by mouth daily.      DULoxetine (CYMBALTA) 30 MG capsule Take 30 mg by mouth daily.     ELDERBERRY PO Take by mouth daily.     furosemide (LASIX) 40 MG tablet Take 1 tablet (40 mg total) by mouth daily. 90 tablet 3   Lido-Menthol-Methyl Sal-Camph (CBD KINGS EX) Place  2,000 mg under the tongue. CBD 2,000 once per day     losartan (COZAAR) 100 MG tablet Take 1 tablet (100 mg total) by mouth daily. 90 tablet 3   LUTEIN PO Take by mouth daily.     Omega-3 Fatty Acids (OMEGA-3 FISH OIL PO) Take by mouth daily.     OVER THE COUNTER MEDICATION Super beets     OVER THE COUNTER MEDICATION PREAGEN     TURMERIC PO Take by mouth daily.     Emollient (COLLAGEN EX) Apply 3 g topically. 3 once per day (Patient not taking: Reported on 12/07/2021)     No current facility-administered medications for this visit.     Past Medical History:  Diagnosis Date   Anxiety    Arthritis    Atrial fibrillation ([place])    Depression    Diabetes mellitus without complication ([place])    no meds yet   Full dentures    Hypertension    Hypertension    Joint pain    Lactose intolerance    Osteoarthritis    Palpitations    Prediabetes    Stenosis of right middle cerebral artery 2011   Wears glasses     Past Surgical History:  Procedure Laterality Date   CEREBRAL ANGIOGRAM     COLONOSCOPY     DIAGNOSTIC LAPAROSCOPY  fibroid   MASS EXCISION Right 04/27/2014   Procedure: EXCISION MASS RIGHT RING FINGER DEBRIDEMENT PROXIMAL INTERPHALANGEAL JOINT RIGHT RING FINGER (PIP);  Surgeon: Wynonia Sours, MD;  Location: [facility];  Service: Orthopedics;  Laterality: Right;    Social History   Socioeconomic History   Marital status: Married    Spouse name: Not on file   Number of children: 2   Years of education: Not on file   Highest education level: Not on file  Occupational History   Not on file  Tobacco Use   Smoking status: Never   Smokeless tobacco: Never  Vaping Use   Vaping Use: Never used  Substance and Sexual Activity   Alcohol use: Yes    Comment: Occasional   Drug use: No   Sexual activity: Not on file  Other Topics Concern   Not on file  Social History Narrative   Not on file   Social Determinants of Health   Financial Resource Strain:  Not on file  Food Insecurity: Not on file  Transportation Needs: Not on file  Physical Activity: Not on file  Stress: Not on file  Social Connections: Not on file  Intimate Partner Violence: Not on file    Family History  Problem Relation Age of Onset   Dementia Mother    Hypertension Mother    Cancer Father    Obesity Father    Obesity Sister     ROS: Some knee arthralgias but no fevers or chills, productive cough, hemoptysis, dysphasia, odynophagia, melena, hematochezia, dysuria, hematuria, rash, seizure activity, orthopnea, PND, pedal edema, claudication. Remaining systems are negative.  Physical Exam: Well-developed well-nourished in no acute distress.  Skin is warm and dry.  HEENT is normal.  Neck is supple.  Chest is clear to auscultation with normal expansion.  Cardiovascular exam is irregular Abdominal exam nontender or distended. No masses palpated. Extremities show no edema. neuro grossly intact  ECG-September 29, 2021-atrial fibrillation with nonspecific ST changes.  Personally reviewed  A/P  1 permanent atrial fibrillation-patient previously declined cardioversion.  We will plan to continue carvedilol for rate control.  Continue apixaban.  Check hemoglobin and renal function.  2 hypertension-blood pressure controlled.  Continue present medications.  3 Hyperlipidemia-continue statin.  Check lipids and liver.  4 chronic diastolic congestive heart failure-patient appears to be euvolemic.  We will continue Lasix at present dose.  Check potassium and renal function.  5 History of right medial cerebral artery stenosis-continue Plavix.  Kirk Ruths, MD

## 2021-12-07 ENCOUNTER — Other Ambulatory Visit: Payer: Self-pay

## 2021-12-07 ENCOUNTER — Encounter: Payer: Self-pay | Admitting: Cardiology

## 2021-12-07 ENCOUNTER — Ambulatory Visit (INDEPENDENT_AMBULATORY_CARE_PROVIDER_SITE_OTHER): Payer: Medicare Other | Admitting: Cardiology

## 2021-12-07 VITALS — BP 126/76 | HR 73 | Ht 64.0 in | Wt 264.6 lb

## 2021-12-07 DIAGNOSIS — E785 Hyperlipidemia, unspecified: Secondary | ICD-10-CM

## 2021-12-07 DIAGNOSIS — I6601 Occlusion and stenosis of right middle cerebral artery: Secondary | ICD-10-CM | POA: Diagnosis not present

## 2021-12-07 DIAGNOSIS — I5032 Chronic diastolic (congestive) heart failure: Secondary | ICD-10-CM | POA: Diagnosis not present

## 2021-12-07 DIAGNOSIS — I4821 Permanent atrial fibrillation: Secondary | ICD-10-CM | POA: Diagnosis not present

## 2021-12-07 DIAGNOSIS — I10 Essential (primary) hypertension: Secondary | ICD-10-CM

## 2021-12-07 LAB — COMPREHENSIVE METABOLIC PANEL
ALT: 17 IU/L (ref 0–32)
AST: 20 IU/L (ref 0–40)
Albumin/Globulin Ratio: 1.5 (ref 1.2–2.2)
Albumin: 4.4 g/dL (ref 3.7–4.7)
Alkaline Phosphatase: 110 IU/L (ref 44–121)
BUN/Creatinine Ratio: 15 (ref 12–28)
BUN: 11 mg/dL (ref 8–27)
Bilirubin Total: 0.7 mg/dL (ref 0.0–1.2)
CO2: 26 mmol/L (ref 20–29)
Calcium: 9.4 mg/dL (ref 8.7–10.3)
Chloride: 100 mmol/L (ref 96–106)
Creatinine, Ser: 0.75 mg/dL (ref 0.57–1.00)
Globulin, Total: 2.9 g/dL (ref 1.5–4.5)
Glucose: 135 mg/dL — ABNORMAL HIGH (ref 70–99)
Potassium: 4.1 mmol/L (ref 3.5–5.2)
Sodium: 138 mmol/L (ref 134–144)
Total Protein: 7.3 g/dL (ref 6.0–8.5)
eGFR: 85 mL/min/{1.73_m2} (ref >59)

## 2021-12-07 LAB — LIPID PANEL
Chol/HDL Ratio: 2 ratio (ref 0.0–4.4)
Cholesterol, Total: 135 mg/dL (ref 100–199)
HDL: 67 mg/dL (ref >39)
LDL Chol Calc (NIH): 55 mg/dL (ref 0–99)
Triglycerides: 64 mg/dL (ref 0–149)
VLDL Cholesterol Cal: 13 mg/dL (ref 5–40)

## 2021-12-07 LAB — CBC
Hematocrit: 42.2 % (ref 34.0–46.6)
Hemoglobin: 13.5 g/dL (ref 11.1–15.9)
MCH: 29.5 pg (ref 26.6–33.0)
MCHC: 32 g/dL (ref 31.5–35.7)
MCV: 92 fL (ref 79–97)
Platelets: 174 10*3/uL (ref 150–450)
RBC: 4.58 x10E6/uL (ref 3.77–5.28)
RDW: 13 % (ref 11.7–15.4)
WBC: 5.3 10*3/uL (ref 3.4–10.8)

## 2021-12-07 NOTE — Patient Instructions (Signed)

## 2021-12-27 ENCOUNTER — Encounter: Payer: Self-pay | Admitting: Cardiology

## 2022-01-29 DIAGNOSIS — M25562 Pain in left knee: Secondary | ICD-10-CM | POA: Diagnosis not present

## 2022-01-29 DIAGNOSIS — M25561 Pain in right knee: Secondary | ICD-10-CM | POA: Diagnosis not present

## 2022-02-05 DIAGNOSIS — M1711 Unilateral primary osteoarthritis, right knee: Secondary | ICD-10-CM | POA: Diagnosis not present

## 2022-02-05 DIAGNOSIS — M1712 Unilateral primary osteoarthritis, left knee: Secondary | ICD-10-CM | POA: Diagnosis not present

## 2022-02-11 DIAGNOSIS — R194 Change in bowel habit: Secondary | ICD-10-CM | POA: Diagnosis not present

## 2022-02-12 ENCOUNTER — Telehealth: Payer: Self-pay | Admitting: Cardiology

## 2022-02-12 NOTE — Telephone Encounter (Signed)
? ?  Pre-operative Risk Assessment  ?  ?Patient Name: Cynthia Frost  ?DOB: 1951/03/11 ?MRN: [id number]  ? ?  ? ?Request for Surgical Clearance   ? ?Procedure:  Right Knee replacement ? ?Date of Surgery:  03-08-22 ?[facility]                               ?   ?Surgeon:  Dr Frederik Pear ?Surgeon's Group or Practice Name:   ?Phone number [phone]:   ?Fax number:  [phone] ?  ?Type of Clearance Requested:   Both- patient did not know what medicine she was on ? ?  ?Type of Anesthesia:  Spinal ?  ?Additional requests/questions:   ? ?Signed, ?Glyn Ade   ?02/12/2022, 2:23 PM  ? ?

## 2022-02-12 NOTE — Telephone Encounter (Signed)
Patient with diagnosis of afib on Eliquis for anticoagulation.   ? ?Procedure: right TKA ?Date of procedure: 03/08/22 ? ?CHA2DS2-VASc Score = 5  ?This indicates a 7.2% annual risk of stroke. ?The patient's score is based upon: ?CHF History: 1 ?HTN History: 1 ?Diabetes History: 1 ?Stroke History: 0 ?Vascular Disease History: 0 ?Age Score: 1 ?Gender Score: 1 ?  ?CrCl 40m/min using adjusted body weight due to obesity ?Platelet count 174K ? ?Per office protocol, patient can hold Eliquis for 3 days prior to procedure.   ?

## 2022-02-12 NOTE — Telephone Encounter (Signed)
Left a message for the patient to call back and speak to the on-call preop APP of the day. ? ?During the meantime, clinical pharmacist to review Eliquis ?

## 2022-02-12 NOTE — Telephone Encounter (Signed)
Dr. Stanford Breed to review.  I spoke with Cynthia Frost.  She denies any recent chest pain or worsening dyspnea since the last office visit.  Overall she is doing well.  Unfortunately because of significant knee pain, she is unable to achieve more than 4 METS of activity. ? ?Based on your previous office note, patient has a history of atrial fibrillation and a questionable history of heart attack.  She reportedly may have had a heart attack 20 years ago however mildly in December 2017 showed normal EF 51% but no ischemia or infarction. ? ?Do you recommend any additional evaluation prior to the knee surgery? ? ?Please forward your response to P CV DIV PREOP ?

## 2022-02-13 NOTE — Telephone Encounter (Signed)
Patient was calling back with question/concerns. Please advise ?

## 2022-02-13 NOTE — Telephone Encounter (Signed)
? ? ?  Patient Name: Cynthia Frost  ?DOB: 12/13/1950 ?MRN: [id number] ? ?Primary Cardiologist: Kirk Ruths, MD ? ?Chart reviewed as part of pre-operative protocol coverage. Almyra Deforest, [place] spoke with Cynthia Frost on 02/12/2022. She denied any recent chest pain or worsening dyspnea since the last office visit.  Overall she was doing well. Unfortunately because of significant knee pain, she is unable to achieve more than 4 METS of activity. Per review by Dr. Stanford Breed, primary cardiologist, Cynthia Frost would be at acceptable risk for the planned procedure without further cardiovascular testing.  ? ?The patient was advised that if she develops new symptoms prior to surgery to contact our office to arrange for a follow-up visit, and she verbalized understanding. ? ?Patient with diagnosis of afib on Eliquis for anticoagulation.   ?  ?Procedure: right TKA ?Date of procedure: 03/08/22 ?  ?CHA2DS2-VASc Score = 5  ?This indicates a 7.2% annual risk of stroke. ?The patient's score is based upon: ?CHF History: 1 ?HTN History: 1 ?Diabetes History: 1 ?Stroke History: 0 ?Vascular Disease History: 0 ?Age Score: 1 ?Gender Score: 1 ?  ?CrCl 54m/min using adjusted body weight due to obesity ?Platelet count 174K ?  ?Per office protocol, patient can hold Eliquis for 3 days prior to procedure. ? ?I will route this recommendation to the requesting party via Epic fax function and remove from pre-op pool. ? ?Please call with questions. ? ?ELenna Sciara NP ?02/13/2022, 7:58 AM ? ?

## 2022-02-19 DIAGNOSIS — Z01812 Encounter for preprocedural laboratory examination: Secondary | ICD-10-CM | POA: Diagnosis not present

## 2022-02-19 DIAGNOSIS — M1711 Unilateral primary osteoarthritis, right knee: Secondary | ICD-10-CM | POA: Diagnosis not present

## 2022-02-19 NOTE — Telephone Encounter (Signed)
Patient states she is returning a call to pre-op regarding her clearance. Covering APP unavailable for transfer. Please return call as able. ?

## 2022-02-19 NOTE — Telephone Encounter (Signed)
I returned call to the pt. She said she had not been told she was cleared. I read to her the notes from [place], [facility] and Diona Browner, NP. Pt did state she forgot that she did actually s/w [facility] on 02/12/22. I did advise as to hold Eliquis x 3 days prior to surgery.  Pt aware for surgery on 4/28, she will take her last dose 03/04/22, will NOT take on 4/25, 4/26, 4/27; surgery 4/28 and will resume once felt safe by the surgeon. Pt thanked me for the call and the help. She said it must have been an old message on her phone.  ?

## 2022-02-21 DIAGNOSIS — Z01812 Encounter for preprocedural laboratory examination: Secondary | ICD-10-CM | POA: Diagnosis not present

## 2022-02-21 DIAGNOSIS — Z96651 Presence of right artificial knee joint: Secondary | ICD-10-CM | POA: Diagnosis not present

## 2022-02-21 DIAGNOSIS — Z471 Aftercare following joint replacement surgery: Secondary | ICD-10-CM | POA: Diagnosis not present

## 2022-02-21 DIAGNOSIS — M25661 Stiffness of right knee, not elsewhere classified: Secondary | ICD-10-CM | POA: Diagnosis not present

## 2022-02-25 DIAGNOSIS — H2513 Age-related nuclear cataract, bilateral: Secondary | ICD-10-CM | POA: Diagnosis not present

## 2022-02-25 DIAGNOSIS — H524 Presbyopia: Secondary | ICD-10-CM | POA: Diagnosis not present

## 2022-02-25 DIAGNOSIS — E119 Type 2 diabetes mellitus without complications: Secondary | ICD-10-CM | POA: Diagnosis not present

## 2022-02-25 DIAGNOSIS — H52223 Regular astigmatism, bilateral: Secondary | ICD-10-CM | POA: Diagnosis not present

## 2022-02-25 DIAGNOSIS — H5213 Myopia, bilateral: Secondary | ICD-10-CM | POA: Diagnosis not present

## 2022-02-27 DIAGNOSIS — M1711 Unilateral primary osteoarthritis, right knee: Secondary | ICD-10-CM | POA: Diagnosis not present

## 2022-03-04 ENCOUNTER — Other Ambulatory Visit: Payer: Self-pay | Admitting: Orthopedic Surgery

## 2022-03-04 DIAGNOSIS — I679 Cerebrovascular disease, unspecified: Secondary | ICD-10-CM | POA: Diagnosis not present

## 2022-03-04 DIAGNOSIS — R809 Proteinuria, unspecified: Secondary | ICD-10-CM | POA: Diagnosis not present

## 2022-03-04 DIAGNOSIS — Z23 Encounter for immunization: Secondary | ICD-10-CM | POA: Diagnosis not present

## 2022-03-04 DIAGNOSIS — M1990 Unspecified osteoarthritis, unspecified site: Secondary | ICD-10-CM | POA: Diagnosis not present

## 2022-03-04 DIAGNOSIS — I10 Essential (primary) hypertension: Secondary | ICD-10-CM | POA: Diagnosis not present

## 2022-03-04 DIAGNOSIS — F419 Anxiety disorder, unspecified: Secondary | ICD-10-CM | POA: Diagnosis not present

## 2022-03-04 DIAGNOSIS — M1711 Unilateral primary osteoarthritis, right knee: Secondary | ICD-10-CM | POA: Diagnosis not present

## 2022-03-04 DIAGNOSIS — F39 Unspecified mood [affective] disorder: Secondary | ICD-10-CM | POA: Diagnosis not present

## 2022-03-04 DIAGNOSIS — E1139 Type 2 diabetes mellitus with other diabetic ophthalmic complication: Secondary | ICD-10-CM | POA: Diagnosis not present

## 2022-03-04 DIAGNOSIS — E1129 Type 2 diabetes mellitus with other diabetic kidney complication: Secondary | ICD-10-CM | POA: Diagnosis not present

## 2022-03-04 DIAGNOSIS — I4819 Other persistent atrial fibrillation: Secondary | ICD-10-CM | POA: Diagnosis not present

## 2022-03-04 DIAGNOSIS — E113293 Type 2 diabetes mellitus with mild nonproliferative diabetic retinopathy without macular edema, bilateral: Secondary | ICD-10-CM | POA: Diagnosis not present

## 2022-03-04 DIAGNOSIS — I509 Heart failure, unspecified: Secondary | ICD-10-CM | POA: Diagnosis not present

## 2022-03-06 DIAGNOSIS — M1711 Unilateral primary osteoarthritis, right knee: Secondary | ICD-10-CM | POA: Diagnosis present

## 2022-03-06 NOTE — Care Plan (Signed)
Ortho Bundle Case Management Note ? ?Patient Details  ?Name: Cynthia Frost ?MRN: [id number] ?Date of Birth: [birth year] ? ?  Met with patient in the office prior to surgery. She will discharge to home with family to assist. Rolling walker ordered. OPPT set up with [facility]. Patient and MD in agreement with plan. Choice offered               ? ? ? ?DME Arranged:  Walker rolling ?DME Agency:  [employer] ? ?HH Arranged:    ?[facility] Agency:    ? ?Additional Comments: ?Please contact me with any questions of if this plan should need to change. ? ?Mardelle Matte  [facility] Orthopaedic Specialist  [phone] ?03/06/2022, 11:36 AM ?  ?

## 2022-03-06 NOTE — H&P (Signed)
TOTAL KNEE ADMISSION H&P ? ?Patient is being admitted for right total knee arthroplasty. ? ?Subjective: ? ?Chief Complaint:right knee pain. ? ?HPI: Cynthia Frost, 71 y.o. female, has a history of pain and functional disability in the right knee due to arthritis and has failed non-surgical conservative treatments for greater than 12 weeks to includeNSAID's and/or analgesics, use of assistive devices, weight reduction as appropriate, and activity modification.  Onset of symptoms was gradual, starting  several  years ago with gradually worsening course since that time. The patient noted no past surgery on the right knee(s).  Patient currently rates pain in the right knee(s) at 10 out of 10 with activity. Patient has night pain, worsening of pain with activity and weight bearing, pain that interferes with activities of daily living, pain with passive range of motion, crepitus, and joint swelling.  Patient has evidence of subchondral sclerosis, periarticular osteophytes, and joint space narrowing by imaging studies.  There is no active infection. ? ?Patient Active Problem List  ? Diagnosis Date Noted  ? Osteoarthritis of right knee 03/06/2022  ? Acute upper respiratory infection 11/01/2020  ? Diabetes mellitus ([place]) 05/24/2020  ? Essential hypertension 05/24/2020  ? Class 3 severe obesity with serious comorbidity and body mass index (BMI) of 40.0 to 44.9 in adult [facility]) 05/24/2020  ? Plantar fasciitis 06/16/2018  ? Stenosis of right middle cerebral artery   ? ?Past Medical History:  ?Diagnosis Date  ? Anxiety   ? Arthritis   ? Atrial fibrillation ([place])   ? Depression   ? Diabetes mellitus without complication ([place])   ? no meds yet  ? Full dentures   ? Hypertension   ? Hypertension   ? Joint pain   ? Lactose intolerance   ? Osteoarthritis   ? Palpitations   ? Prediabetes   ? Stenosis of right middle cerebral artery 2011  ? Wears glasses   ?  ?Past Surgical History:  ?Procedure Laterality Date  ? CEREBRAL ANGIOGRAM    ?  COLONOSCOPY    ? DIAGNOSTIC LAPAROSCOPY    ? fibroid  ? MASS EXCISION Right 04/27/2014  ? Procedure: EXCISION MASS RIGHT RING FINGER DEBRIDEMENT PROXIMAL INTERPHALANGEAL JOINT RIGHT RING FINGER (PIP);  Surgeon: Wynonia Sours, MD;  Location: [facility];  Service: Orthopedics;  Laterality: Right;  ?  ?No current facility-administered medications for this encounter.  ? ?Current Outpatient Medications  ?Medication Sig Dispense Refill Last Dose  ? acetaminophen (TYLENOL) 500 MG tablet Take 1,000 mg by mouth every 6 (six) hours as needed for mild pain or moderate pain.     ? amLODipine (NORVASC) 10 MG tablet TAKE 1 TABLET DAILY 90 tablet 3   ? apixaban (ELIQUIS) 5 MG TABS tablet TAKE 1 TABLET TWICE A DAY 180 tablet 1   ? atorvastatin (LIPITOR) 10 MG tablet Take 10 mg by mouth daily.     ? [place]-Glucosamine-Vit D (OSTEO BI-FLEX ONE PER DAY PO) Take 2 tablets by mouth daily.     ? carvedilol (COREG) 25 MG tablet Take 0.5 tablets (12.5 mg total) by mouth daily. (Patient taking differently: Take 25 mg by mouth daily.) 30 tablet 2   ? Cholecalciferol (VITAMIN D3) 75 MCG (3000 UT) TABS Take 1 tablet by mouth daily. 30 tablet 0   ? clopidogrel (PLAVIX) 75 MG tablet Take 75 mg by mouth daily.     ? Coenzyme Q10 (CO Q 10 PO) Take 100 mg by mouth daily.     ? cyanocobalamin 100 MCG  tablet Take 100 mcg by mouth daily.     ? diazepam (VALIUM) 10 MG tablet Take 10 mg by mouth once.     ? doxazosin (CARDURA) 8 MG tablet Take 8 mg by mouth daily.      ? DULoxetine (CYMBALTA) 30 MG capsule Take 30 mg by mouth daily.     ? ELDERBERRY PO Take 1 capsule by mouth daily.     ? furosemide (LASIX) 40 MG tablet Take 1 tablet (40 mg total) by mouth daily. (Patient taking differently: Take 20 mg by mouth daily.) 90 tablet 3   ? losartan-hydrochlorothiazide (HYZAAR) 100-25 MG tablet Take 1 tablet by mouth daily.     ? LUTEIN PO Take 1 tablet by mouth daily.     ? Omega-3 Fatty Acids (OMEGA-3 FISH OIL PO) Take 1 capsule by mouth  daily. Krill oil     ? OVER THE COUNTER MEDICATION Take 1 capsule by mouth daily. Super beets     ? OVER THE COUNTER MEDICATION Take 1 tablet by mouth daily. PREAGEN     ? traMADol (ULTRAM) 50 MG tablet Take 25-50 mg by mouth every 6 (six) hours as needed for pain.     ? TURMERIC PO Take 1 tablet by mouth daily. Chew     ? cetirizine (ZYRTEC ALLERGY) 10 MG tablet Take 1 tablet (10 mg total) by mouth daily. (Patient not taking: Reported on 03/05/2022) 30 tablet 0 Not Taking  ? losartan (COZAAR) 100 MG tablet Take 1 tablet (100 mg total) by mouth daily. (Patient not taking: Reported on 03/05/2022) 90 tablet 3 Not Taking  ? ?Allergies  ?Allergen Reactions  ? Corticosteroids Other (See Comments)  ?  Increases glucose levels  ? Escitalopram Oxalate   ?  Other reaction(s): diarrhea/poor sleep (08/2019)  ? Resource Thickenup Dairy [Compleat] Diarrhea  ?  Dairy  ? Rosuvastatin Calcium   ?  Other reaction(s): nose bleeds  ? Tomato Other (See Comments)  ?  Arthritis  ?  ?Social History  ? ?Tobacco Use  ? Smoking status: Never  ? Smokeless tobacco: Never  ?Substance Use Topics  ? Alcohol use: Yes  ?  Comment: Occasional  ?  ?Family History  ?Problem Relation Age of Onset  ? Dementia Mother   ? Hypertension Mother   ? Cancer Father   ? Obesity Father   ? Obesity Sister   ?  ? ?Review of Systems  ?Constitutional: Negative.   ?HENT: Negative.    ?Eyes: Negative.   ?Cardiovascular:   ?     Htn, irregular heartbeat  ?Gastrointestinal: Negative.   ?Endocrine: Negative.   ?Genitourinary: Negative.   ?Musculoskeletal:  Positive for arthralgias.  ?Skin: Negative.   ?Allergic/Immunologic: Negative.   ?Neurological: Negative.   ?Hematological: Negative.   ?Psychiatric/Behavioral:  The patient is nervous/anxious.   ? ?Objective: ? ?Physical Exam ?Constitutional:   ?   Appearance: Normal appearance. She is obese.  ?HENT:  ?   Head: Normocephalic and atraumatic.  ?   Nose: Nose normal.  ?   Mouth/Throat:  ?   Mouth: Mucous membranes are  moist.  ?   Pharynx: Oropharynx is clear.  ?Eyes:  ?   Pupils: Pupils are equal, round, and reactive to light.  ?Cardiovascular:  ?   Pulses: Normal pulses.  ?Pulmonary:  ?   Effort: Pulmonary effort is normal.  ?Musculoskeletal:     ?   General: Tenderness present.  ?   Cervical back: Normal range of motion and neck  supple.  ?   Comments: the patient has decreased range of motion in bilateral knees from 5-10? to 105?Marland Kitchen  No instability.  Pain over the medial joint line right greater than left knee.  Obvious crepitance with range of motion.  Calves are soft and nontender bilaterally.  ?Skin: ?   General: Skin is warm and dry.  ?Neurological:  ?   General: No focal deficit present.  ?   Mental Status: She is alert and oriented to person, place, and time. Mental status is at baseline.  ?Psychiatric:     ?   Mood and Affect: Mood normal.     ?   Behavior: Behavior normal.     ?   Thought Content: Thought content normal.     ?   Judgment: Judgment normal.  ? ? ?Vital signs in last 24 hours: ?BP: ()/()  ?Arterial Line BP: ()/()  ? ?Labs: ? ? ?Estimated body mass index is 45.42 kg/m? as calculated from the following: ?  Height as of 12/07/21: '5\' 4"'$  (1.626 m). ?  Weight as of 12/07/21: 120 kg. ? ? ?Imaging Review ?Plain radiographs demonstrate  bilateral AP weightbearing, bilateral Rosenberg, lateral sunrise views of bilateral knees are taken and reviewed in office today.  Patient has end-stage arthritis medial compartment bilateral knee with large periarticular osteophyte formation. ? ? ?Assessment/Plan: ? ?End stage arthritis, right knee  ? ?The patient history, physical examination, clinical judgment of the provider and imaging studies are consistent with end stage degenerative joint disease of the right knee(s) and total knee arthroplasty is deemed medically necessary. The treatment options including medical management, injection therapy arthroscopy and arthroplasty were discussed at length. The risks and benefits of total  knee arthroplasty were presented and reviewed. The risks due to aseptic loosening, infection, stiffness, patella tracking problems, thromboembolic complications and other imponderables were discussed. The pat

## 2022-03-07 NOTE — Patient Instructions (Signed)
DUE TO COVID-19 ONLY TWO VISITORS  (aged 73 and older)  ARE ALLOWED TO COME WITH YOU AND STAY IN THE WAITING ROOM ONLY DURING PRE OP AND PROCEDURE.   ?**NO VISITORS ARE ALLOWED IN THE SHORT STAY AREA OR RECOVERY ROOM!!** ? ? ? Your procedure is scheduled on: 03-11-22 ? ? Report to [facility] Main Entrance ? ?  Report to admitting at        0915   AM ? ? Call this number if you have problems the morning of surgery [phone] ? ? Do not eat food :After Midnight. ? ? After Midnight you may have the following liquids until _0900 _____ AM/  DAY OF SURGERY   then nothing by mouth ? ?Water ?Black Coffee (sugar ok, NO MILK/CREAM OR CREAMERS)  ?Tea (sugar ok, NO MILK/CREAM OR CREAMERS) regular and decaf                             ?Plain Jell-O (NO RED)                                           ?Fruit ices (not with fruit pulp, NO RED)                                     ?Popsicles (NO RED)                                                                  ?Juice: apple, WHITE grape, WHITE cranberry ?Sports drinks like Gatorade (NO RED) ?Clear broth(vegetable,chicken,beef) ? ?            ?  ?  ?       If you have questions, please contact your surgeon?s office. ? ? ?FOLLOW ANY ADDITIONAL PRE OP INSTRUCTIONS YOU RECEIVED FROM YOUR SURGEON'S OFFICE!!! ?  ?  ?Oral Hygiene is also important to reduce your risk of infection.                                    ?Remember - BRUSH YOUR TEETH THE MORNING OF SURGERY WITH YOUR REGULAR TOOTHPASTE ? ? Do NOT smoke after Midnight ? ? Take these medicines the morning of surgery with A SIP OF WATER: tramadol if needed, doxazosin, duloxetine, amlodipine, carvedilol ? ?DO NOT TAKE ANY ORAL DIABETIC MEDICATIONS DAY OF YOUR SURGERY ? ?Bring CPAP mask and tubing day of surgery. ?                  ?           You may not have any metal on your body including hair pins, jewelry, and body piercing ? ?           Do not wear make-up, lotions, powders, perfumes/cologne, or deodorant ? ?Do not  wear nail polish including gel and S&S, artificial/acrylic nails, or any other type of covering on natural nails including finger and toenails. If you  have artificial nails, gel coating, etc. that needs to be removed by a nail salon please have this removed prior to surgery or surgery may need to be canceled/ delayed if the surgeon/ anesthesia feels like they are unable to be safely monitored.  ? ?Do not shave  48 hours prior to surgery.  ? ?        ? ? Do not bring valuables to the hospital. Home NOT ?            RESPONSIBLE   FOR VALUABLES. ? ? Contacts, dentures or bridgework may not be worn into surgery. ? ? Bring small overnight bag day of surgery. ?  ? Patients discharged on the day of surgery will not be allowed to drive home.  Someone NEEDS to stay with you for the first 24 hours after anesthesia. ? ?             Please read over the following fact sheets you were given: IF [place] [phone] ? ? ?   [place] [facility] - Preparing for Surgery ?Before surgery, you can play an important role.  Because skin is not sterile, your skin needs to be as free of germs as possible.  You can reduce the number of germs on your skin by washing with CHG (chlorahexidine gluconate) soap before surgery.  CHG is an antiseptic cleaner which kills germs and bonds with the skin to continue killing germs even after washing. ?Please DO NOT use if you have an allergy to CHG or antibacterial soaps.  If your skin becomes reddened/irritated stop using the CHG and inform your nurse when you arrive at Short Stay. ?Do not shave (including legs and underarms) for at least 48 hours prior to the first CHG shower.  You may shave your face/neck. ?Please follow these instructions carefully: ? 1.  Shower with CHG Soap the night before surgery and the  morning of Surgery. ? 2.  If you choose to wash your hair, wash your hair first as usual with your  normal  shampoo. ? 3.  After you  shampoo, rinse your hair and body thoroughly to remove the  shampoo.                           4.  Use CHG as you would any other liquid soap.  You can apply chg directly  to the skin and wash  ?                     Gently with a scrungie or clean washcloth. ? 5.  Apply the CHG Soap to your body ONLY FROM THE NECK DOWN.   Do not use on face/ open      ?                     Wound or open sores. Avoid contact with eyes, ears mouth and genitals (private parts).  ?                     Production manager,  Genitals (private parts) with your normal soap. ?            6.  Wash thoroughly, paying special attention to the area where your surgery  will be performed. ? 7.  Thoroughly rinse your body with warm water from the neck down. ? 8.  DO NOT shower/wash  with your normal soap after using and rinsing off  the CHG Soap. ?               9.  Pat yourself dry with a clean towel. ?           10.  Wear clean pajamas. ?           11.  Place clean sheets on your bed the night of your first shower and do not  sleep with pets. ?Day of Surgery : ?Do not apply any lotions/deodorants the morning of surgery.  Please wear clean clothes to the hospital/surgery center. ? ?FAILURE TO FOLLOW THESE INSTRUCTIONS MAY RESULT IN THE CANCELLATION OF YOUR SURGERY ?PATIENT SIGNATURE_________________________________ ? ?NURSE SIGNATURE__________________________________ ? ?________________________________________________________________________  ? ?Incentive Spirometer ? ?An incentive spirometer is a tool that can help keep your lungs clear and active. This tool measures how well you are filling your lungs with each breath. Taking long deep breaths may help reverse or decrease the chance of developing breathing (pulmonary) problems (especially infection) following: ?A long period of time when you are unable to move or be active. ?BEFORE THE PROCEDURE  ?If the spirometer includes an indicator to show your best effort, your nurse or respiratory therapist will set  it to a desired goal. ?If possible, sit up straight or lean slightly forward. Try not to slouch. ?Hold the incentive spirometer in an upright position. ?INSTRUCTIONS FOR USE  ?Sit on the edge of your bed if possible, or sit up as far as you can in bed or on a chair. ?Hold the incentive spirometer in an upright position. ?Breathe out normally. ?Place the mouthpiece in your mouth and seal your lips tightly around it. ?Breathe in slowly and as deeply as possible, raising the piston or the ball toward the top of the column. ?Hold your breath for 3-5 seconds or for as long as possible. Allow the piston or ball to fall to the bottom of the column. ?Remove the mouthpiece from your mouth and breathe out normally. ?Rest for a few seconds and repeat Steps 1 through 7 at least 10 times every 1-2 hours when you are awake. Take your time and take a few normal breaths between deep breaths. ?The spirometer may include an indicator to show your best effort. Use the indicator as a goal to work toward during each repetition. ?After each set of 10 deep breaths, practice coughing to be sure your lungs are clear. If you have an incision (the cut made at the time of surgery), support your incision when coughing by placing a pillow or rolled up towels firmly against it. ?Once you are able to get out of bed, walk around indoors and cough well. You may stop using the incentive spirometer when instructed by your caregiver.  ?RISKS AND COMPLICATIONS ?Take your time so you do not get dizzy or light-headed. ?If you are in pain, you may need to take or ask for pain medication before doing incentive spirometry. It is harder to take a deep breath if you are having pain. ?AFTER USE ?Rest and breathe slowly and easily. ?It can be helpful to keep track of a log of your progress. Your caregiver can provide you with a simple table to help with this. ?If you are using the spirometer at home, follow these instructions: ?SEEK MEDICAL CARE IF:  ?You are  having difficultly using the spirometer. ?You have trouble using the spirometer as often as instructed. ?Your pain medication is not giving enough  relief while using the spirometer. ?You develop fever of 100

## 2022-03-07 NOTE — Progress Notes (Addendum)
PCP - Gaynelle Arabian, MD ?Cardiologist - Kirk Ruths Clearance 02-13-22 epic ? ?PPM/ICD -  ?Device Orders -  ?Rep Notified -  ? ?Chest x-ray - 09-29-21 epic ?EKG - 10-01-21 epic ?Stress Test - 2017 ?ECHO - 01-06-22 epic ?Cardiac Cath -  ? ?Sleep Study -  ?CPAP -  ? ?Fasting Blood Sugar - 109-127 ?Checks Blood Sugar _occasionally____ times a day ? ?Blood Thinner Instructions:Elequis hold 3 days  Plavix stopped 2 weeks was on Plavix for artery in head that had a blockage  ?Aspirin Instructions: ? ?ERAS Protcol - ?PRE-SURGERY G2-  ? ? ?COVID vaccine -x4 Moderna and pfizer ? ?Activity--Able to walk without SOB and complete ADL's limited due to knee pain ? ?Anesthesia review: Afib HTN, DM no meds ? ?Patient denies shortness of breath, fever, cough and chest pain at PAT appointment ? ? ?All instructions explained to the patient, with a verbal understanding of the material. Patient agrees to go over the instructions while at home for a better understanding. Patient also instructed to self quarantine after being tested for COVID-19. The opportunity to ask questions was provided. ?  ?

## 2022-03-08 ENCOUNTER — Other Ambulatory Visit: Payer: Self-pay

## 2022-03-08 ENCOUNTER — Encounter (HOSPITAL_COMMUNITY): Payer: Self-pay

## 2022-03-08 ENCOUNTER — Encounter (HOSPITAL_COMMUNITY)
Admission: RE | Admit: 2022-03-08 | Discharge: 2022-03-08 | Disposition: A | Discharge status: Home / Self Care | Payer: Medicare Other | Source: Ambulatory Visit | Attending: Orthopedic Surgery | Admitting: Orthopedic Surgery

## 2022-03-08 DIAGNOSIS — I10 Essential (primary) hypertension: Secondary | ICD-10-CM | POA: Diagnosis not present

## 2022-03-08 DIAGNOSIS — Z01812 Encounter for preprocedural laboratory examination: Secondary | ICD-10-CM | POA: Insufficient documentation

## 2022-03-08 DIAGNOSIS — E1169 Type 2 diabetes mellitus with other specified complication: Secondary | ICD-10-CM | POA: Insufficient documentation

## 2022-03-08 DIAGNOSIS — Z01818 Encounter for other preprocedural examination: Secondary | ICD-10-CM

## 2022-03-08 HISTORY — DX: Cardiac arrhythmia, unspecified: I49.9

## 2022-03-08 LAB — CBC
HCT: 40.6 % (ref 36.0–46.0)
Hemoglobin: 13.8 g/dL (ref 12.0–15.0)
MCH: 29.4 pg (ref 26.0–34.0)
MCHC: 34 g/dL (ref 30.0–36.0)
MCV: 86.6 fL (ref 80.0–100.0)
Platelets: 214 10*3/uL (ref 150–400)
RBC: 4.69 MIL/uL (ref 3.87–5.11)
RDW: 15.8 % — ABNORMAL HIGH (ref 11.5–15.5)
WBC: 9.7 10*3/uL (ref 4.0–10.5)
nRBC: 0 % (ref 0.0–0.2)

## 2022-03-08 LAB — HEMOGLOBIN A1C
Hgb A1c MFr Bld: 6.4 % — ABNORMAL HIGH (ref 4.8–5.6)
Mean Plasma Glucose: 136.98 mg/dL

## 2022-03-08 LAB — BASIC METABOLIC PANEL
Anion gap: 9 (ref 5–15)
BUN: 28 mg/dL — ABNORMAL HIGH (ref 8–23)
CO2: 28 mmol/L (ref 22–32)
Calcium: 9.6 mg/dL (ref 8.9–10.3)
Chloride: 97 mmol/L — ABNORMAL LOW (ref 98–111)
Creatinine, Ser: 1.06 mg/dL — ABNORMAL HIGH (ref 0.44–1.00)
GFR, Estimated: 55 mL/min — ABNORMAL LOW (ref >60)
Glucose, Bld: 114 mg/dL — ABNORMAL HIGH (ref 70–99)
Potassium: 3.3 mmol/L — ABNORMAL LOW (ref 3.5–5.1)
Sodium: 134 mmol/L — ABNORMAL LOW (ref 135–145)

## 2022-03-08 LAB — GLUCOSE, CAPILLARY: Glucose-Capillary: 128 mg/dL — ABNORMAL HIGH (ref 70–99)

## 2022-03-08 LAB — SURGICAL PCR SCREEN
MRSA, PCR: NEGATIVE
Staphylococcus aureus: NEGATIVE

## 2022-03-11 ENCOUNTER — Other Ambulatory Visit: Payer: Self-pay

## 2022-03-11 ENCOUNTER — Observation Stay (HOSPITAL_COMMUNITY)
Admission: RE | Admit: 2022-03-11 | Discharge: 2022-03-12 | Disposition: A | Discharge status: Home / Self Care | Payer: Medicare Other | Attending: Orthopedic Surgery | Admitting: Orthopedic Surgery

## 2022-03-11 ENCOUNTER — Ambulatory Visit (HOSPITAL_COMMUNITY): Payer: Medicare Other | Admitting: Physician Assistant

## 2022-03-11 ENCOUNTER — Encounter (HOSPITAL_COMMUNITY)
Admission: RE | Disposition: A | Discharge status: Home / Self Care | Payer: Self-pay | Source: Home / Self Care | Attending: Orthopedic Surgery

## 2022-03-11 ENCOUNTER — Ambulatory Visit (HOSPITAL_BASED_OUTPATIENT_CLINIC_OR_DEPARTMENT_OTHER): Payer: Medicare Other | Admitting: Certified Registered Nurse Anesthetist

## 2022-03-11 ENCOUNTER — Encounter (HOSPITAL_COMMUNITY): Payer: Self-pay | Admitting: Orthopedic Surgery

## 2022-03-11 DIAGNOSIS — M21161 Varus deformity, not elsewhere classified, right knee: Secondary | ICD-10-CM

## 2022-03-11 DIAGNOSIS — E119 Type 2 diabetes mellitus without complications: Secondary | ICD-10-CM | POA: Insufficient documentation

## 2022-03-11 DIAGNOSIS — Z7902 Long term (current) use of antithrombotics/antiplatelets: Secondary | ICD-10-CM | POA: Diagnosis not present

## 2022-03-11 DIAGNOSIS — E1169 Type 2 diabetes mellitus with other specified complication: Secondary | ICD-10-CM

## 2022-03-11 DIAGNOSIS — I10 Essential (primary) hypertension: Secondary | ICD-10-CM

## 2022-03-11 DIAGNOSIS — G8918 Other acute postprocedural pain: Secondary | ICD-10-CM | POA: Diagnosis not present

## 2022-03-11 DIAGNOSIS — M1711 Unilateral primary osteoarthritis, right knee: Secondary | ICD-10-CM | POA: Diagnosis not present

## 2022-03-11 DIAGNOSIS — Z7901 Long term (current) use of anticoagulants: Secondary | ICD-10-CM | POA: Insufficient documentation

## 2022-03-11 DIAGNOSIS — I4891 Unspecified atrial fibrillation: Secondary | ICD-10-CM | POA: Diagnosis not present

## 2022-03-11 DIAGNOSIS — Z79899 Other long term (current) drug therapy: Secondary | ICD-10-CM | POA: Diagnosis not present

## 2022-03-11 DIAGNOSIS — Z01818 Encounter for other preprocedural examination: Secondary | ICD-10-CM

## 2022-03-11 DIAGNOSIS — Z96651 Presence of right artificial knee joint: Secondary | ICD-10-CM | POA: Diagnosis not present

## 2022-03-11 HISTORY — PX: TOTAL KNEE ARTHROPLASTY: SHX125

## 2022-03-11 LAB — TYPE AND SCREEN
ABO/RH(D): O POS
Antibody Screen: NEGATIVE

## 2022-03-11 LAB — GLUCOSE, CAPILLARY: Glucose-Capillary: 117 mg/dL — ABNORMAL HIGH (ref 70–99)

## 2022-03-11 LAB — ABO/RH: ABO/RH(D): O POS

## 2022-03-11 SURGERY — ARTHROPLASTY, KNEE, TOTAL
Anesthesia: Spinal | Site: Knee | Laterality: Right

## 2022-03-11 MED ORDER — ONDANSETRON HCL 4 MG/2ML IJ SOLN
INTRAMUSCULAR | Status: AC
  Filled 2022-03-11: qty 2

## 2022-03-11 MED ORDER — OXYCODONE HCL 5 MG PO TABS
5.0000 mg | ORAL_TABLET | ORAL | Status: DC | PRN
  Administered 2022-03-11: 5 mg via ORAL
  Administered 2022-03-12: 10 mg via ORAL
  Filled 2022-03-11: qty 2
  Filled 2022-03-11: qty 1
  Filled 2022-03-11: qty 2
  Filled 2022-03-11: qty 1

## 2022-03-11 MED ORDER — WATER FOR IRRIGATION, STERILE IR SOLN
Status: DC | PRN
  Administered 2022-03-11: 2000 mL

## 2022-03-11 MED ORDER — LOSARTAN POTASSIUM 50 MG PO TABS
100.0000 mg | ORAL_TABLET | Freq: Every day | ORAL | Status: DC
  Administered 2022-03-11 – 2022-03-12 (×2): 100 mg via ORAL
  Filled 2022-03-11 (×2): qty 2

## 2022-03-11 MED ORDER — BUPIVACAINE IN DEXTROSE 0.75-8.25 % IT SOLN
INTRATHECAL | Status: DC | PRN
  Administered 2022-03-11: 2 mL via INTRATHECAL

## 2022-03-11 MED ORDER — PHENOL 1.4 % MT LIQD: 1.0000 | OROMUCOSAL | Status: DC | PRN

## 2022-03-11 MED ORDER — ZOLPIDEM TARTRATE 5 MG PO TABS: 5.0000 mg | ORAL_TABLET | Freq: Every evening | ORAL | Status: DC | PRN

## 2022-03-11 MED ORDER — LACTATED RINGERS IV SOLN: INTRAVENOUS | Status: DC

## 2022-03-11 MED ORDER — FENTANYL CITRATE PF 50 MCG/ML IJ SOSY: 50.0000 ug | PREFILLED_SYRINGE | Freq: Once | INTRAMUSCULAR | Status: AC

## 2022-03-11 MED ORDER — TRANEXAMIC ACID-NACL 1000-0.7 MG/100ML-% IV SOLN
1000.0000 mg | INTRAVENOUS | Status: AC
  Administered 2022-03-11: 1000 mg via INTRAVENOUS
  Filled 2022-03-11: qty 100

## 2022-03-11 MED ORDER — KCL IN DEXTROSE-NACL 20-5-0.45 MEQ/L-%-% IV SOLN
INTRAVENOUS | Status: DC
  Filled 2022-03-11 (×2): qty 1000

## 2022-03-11 MED ORDER — ONDANSETRON HCL 4 MG PO TABS: 4.0000 mg | ORAL_TABLET | Freq: Four times a day (QID) | ORAL | Status: DC | PRN

## 2022-03-11 MED ORDER — OXYCODONE HCL 5 MG PO TABS: 10.0000 mg | ORAL_TABLET | ORAL | Status: DC | PRN

## 2022-03-11 MED ORDER — GLYCOPYRROLATE 0.2 MG/ML IJ SOLN
INTRAMUSCULAR | Status: DC | PRN
  Administered 2022-03-11: .1 mg via INTRAVENOUS

## 2022-03-11 MED ORDER — BUPIVACAINE LIPOSOME 1.3 % IJ SUSP
INTRAMUSCULAR | Status: AC
  Filled 2022-03-11: qty 20

## 2022-03-11 MED ORDER — FUROSEMIDE 20 MG PO TABS
20.0000 mg | ORAL_TABLET | Freq: Every day | ORAL | Status: DC
  Administered 2022-03-11: 20 mg via ORAL
  Filled 2022-03-11 (×2): qty 1

## 2022-03-11 MED ORDER — DOXAZOSIN MESYLATE 8 MG PO TABS
8.0000 mg | ORAL_TABLET | Freq: Every day | ORAL | Status: DC
  Administered 2022-03-12: 8 mg via ORAL
  Filled 2022-03-11: qty 1

## 2022-03-11 MED ORDER — TRANEXAMIC ACID-NACL 1000-0.7 MG/100ML-% IV SOLN
INTRAVENOUS | Status: AC
  Filled 2022-03-11: qty 100

## 2022-03-11 MED ORDER — SODIUM CHLORIDE (PF) 0.9 % IJ SOLN
INTRAMUSCULAR | Status: AC
  Filled 2022-03-11: qty 20

## 2022-03-11 MED ORDER — BUPIVACAINE-EPINEPHRINE (PF) 0.25% -1:200000 IJ SOLN
INTRAMUSCULAR | Status: AC
  Filled 2022-03-11: qty 30

## 2022-03-11 MED ORDER — PHENYLEPHRINE HCL-NACL 20-0.9 MG/250ML-% IV SOLN
INTRAVENOUS | Status: DC | PRN
Start: 2022-03-11 — End: 2022-03-11
  Administered 2022-03-11: 50 ug/min via INTRAVENOUS

## 2022-03-11 MED ORDER — AMLODIPINE BESYLATE 10 MG PO TABS
10.0000 mg | ORAL_TABLET | Freq: Every day | ORAL | Status: DC
  Administered 2022-03-12: 10 mg via ORAL
  Filled 2022-03-11: qty 1

## 2022-03-11 MED ORDER — FENTANYL CITRATE PF 50 MCG/ML IJ SOSY
PREFILLED_SYRINGE | INTRAMUSCULAR | Status: AC
  Administered 2022-03-11: 100 ug via INTRAVENOUS
  Filled 2022-03-11: qty 2

## 2022-03-11 MED ORDER — CLOPIDOGREL BISULFATE 75 MG PO TABS: 75.0000 mg | ORAL_TABLET | Freq: Every day | ORAL | Status: DC

## 2022-03-11 MED ORDER — BUPIVACAINE-EPINEPHRINE (PF) 0.25% -1:200000 IJ SOLN
INTRAMUSCULAR | Status: DC | PRN
Start: 2022-03-11 — End: 2022-03-11
  Administered 2022-03-11: 30 mL

## 2022-03-11 MED ORDER — ACETAMINOPHEN 500 MG PO TABS
1000.0000 mg | ORAL_TABLET | Freq: Four times a day (QID) | ORAL | Status: AC
  Administered 2022-03-11 – 2022-03-12 (×4): 1000 mg via ORAL
  Filled 2022-03-11 (×4): qty 2

## 2022-03-11 MED ORDER — DULOXETINE HCL 30 MG PO CPEP
30.0000 mg | ORAL_CAPSULE | Freq: Every day | ORAL | Status: DC
  Administered 2022-03-12: 30 mg via ORAL
  Filled 2022-03-11: qty 1

## 2022-03-11 MED ORDER — TRANEXAMIC ACID-NACL 1000-0.7 MG/100ML-% IV SOLN
1000.0000 mg | Freq: Once | INTRAVENOUS | Status: AC
  Administered 2022-03-11: 1000 mg via INTRAVENOUS
  Filled 2022-03-11: qty 100

## 2022-03-11 MED ORDER — DOCUSATE SODIUM 100 MG PO CAPS
100.0000 mg | ORAL_CAPSULE | Freq: Two times a day (BID) | ORAL | Status: DC
  Administered 2022-03-11 – 2022-03-12 (×2): 100 mg via ORAL
  Filled 2022-03-11 (×2): qty 1

## 2022-03-11 MED ORDER — SODIUM CHLORIDE 0.9 % IR SOLN
Status: DC | PRN
  Administered 2022-03-11: 1000 mL

## 2022-03-11 MED ORDER — APIXABAN 2.5 MG PO TABS
2.5000 mg | ORAL_TABLET | Freq: Two times a day (BID) | ORAL | Status: DC
  Administered 2022-03-12: 2.5 mg via ORAL
  Filled 2022-03-11: qty 1

## 2022-03-11 MED ORDER — ONDANSETRON HCL 4 MG/2ML IJ SOLN: 4.0000 mg | Freq: Four times a day (QID) | INTRAMUSCULAR | Status: DC | PRN

## 2022-03-11 MED ORDER — POVIDONE-IODINE 10 % EX SWAB
2.0000 "application " | Freq: Once | CUTANEOUS | Status: AC
  Administered 2022-03-11: 2 via TOPICAL

## 2022-03-11 MED ORDER — POLYETHYLENE GLYCOL 3350 17 G PO PACK: 17.0000 g | PACK | Freq: Every day | ORAL | Status: DC | PRN

## 2022-03-11 MED ORDER — CEFAZOLIN SODIUM-DEXTROSE 2-4 GM/100ML-% IV SOLN
2.0000 g | INTRAVENOUS | Status: AC
  Administered 2022-03-11: 2 g via INTRAVENOUS

## 2022-03-11 MED ORDER — PROPOFOL 500 MG/50ML IV EMUL
INTRAVENOUS | Status: DC | PRN
  Administered 2022-03-11: 75 ug/kg/min via INTRAVENOUS

## 2022-03-11 MED ORDER — HYDROMORPHONE HCL 1 MG/ML IJ SOLN: 0.5000 mg | INTRAMUSCULAR | Status: DC | PRN

## 2022-03-11 MED ORDER — PROMETHAZINE HCL 25 MG/ML IJ SOLN: 6.2500 mg | INTRAMUSCULAR | Status: DC | PRN

## 2022-03-11 MED ORDER — MIDAZOLAM HCL 2 MG/2ML IJ SOLN: 1.0000 mg | Freq: Once | INTRAMUSCULAR | Status: AC

## 2022-03-11 MED ORDER — APIXABAN 2.5 MG PO TABS
2.5000 mg | ORAL_TABLET | Freq: Two times a day (BID) | ORAL | 0 refills | Status: DC
Start: 2022-03-11 — End: 2022-05-20

## 2022-03-11 MED ORDER — EPHEDRINE 5 MG/ML INJ
INTRAVENOUS | Status: AC
  Filled 2022-03-11: qty 10

## 2022-03-11 MED ORDER — HYDROCHLOROTHIAZIDE 25 MG PO TABS
25.0000 mg | ORAL_TABLET | Freq: Every day | ORAL | Status: DC
  Administered 2022-03-11 – 2022-03-12 (×2): 25 mg via ORAL
  Filled 2022-03-11 (×2): qty 1

## 2022-03-11 MED ORDER — OXYCODONE HCL 5 MG PO TABS: 5.0000 mg | ORAL_TABLET | Freq: Once | ORAL | Status: DC | PRN

## 2022-03-11 MED ORDER — ONDANSETRON HCL 4 MG/2ML IJ SOLN
INTRAMUSCULAR | Status: DC | PRN
  Administered 2022-03-11: 4 mg via INTRAVENOUS

## 2022-03-11 MED ORDER — SODIUM CHLORIDE (PF) 0.9 % IJ SOLN
INTRAMUSCULAR | Status: AC
  Filled 2022-03-11: qty 50

## 2022-03-11 MED ORDER — BUPIVACAINE LIPOSOME 1.3 % IJ SUSP
20.0000 mL | Freq: Once | INTRAMUSCULAR | Status: DC
Start: 2022-03-11 — End: 2022-03-11

## 2022-03-11 MED ORDER — METOCLOPRAMIDE HCL 5 MG/ML IJ SOLN: 5.0000 mg | Freq: Three times a day (TID) | INTRAMUSCULAR | Status: DC | PRN

## 2022-03-11 MED ORDER — MIDAZOLAM HCL 2 MG/2ML IJ SOLN
INTRAMUSCULAR | Status: AC
  Administered 2022-03-11: 2 mg via INTRAVENOUS
  Filled 2022-03-11: qty 2

## 2022-03-11 MED ORDER — CHLORHEXIDINE GLUCONATE 0.12 % MT SOLN
15.0000 mL | Freq: Once | OROMUCOSAL | Status: AC
  Administered 2022-03-11: 15 mL via OROMUCOSAL

## 2022-03-11 MED ORDER — ROPIVACAINE HCL 5 MG/ML IJ SOLN
INTRAMUSCULAR | Status: DC | PRN
  Administered 2022-03-11: 20 mL via PERINEURAL

## 2022-03-11 MED ORDER — METOCLOPRAMIDE HCL 5 MG PO TABS: 5.0000 mg | ORAL_TABLET | Freq: Three times a day (TID) | ORAL | Status: DC | PRN

## 2022-03-11 MED ORDER — SODIUM CHLORIDE (PF) 0.9 % IJ SOLN
INTRAMUSCULAR | Status: DC | PRN
  Administered 2022-03-11: 70 mL

## 2022-03-11 MED ORDER — CARVEDILOL 12.5 MG PO TABS
25.0000 mg | ORAL_TABLET | Freq: Every day | ORAL | Status: DC
  Administered 2022-03-11 – 2022-03-12 (×2): 25 mg via ORAL
  Filled 2022-03-11 (×2): qty 2

## 2022-03-11 MED ORDER — LOSARTAN POTASSIUM-HCTZ 100-25 MG PO TABS: 1.0000 | ORAL_TABLET | Freq: Every day | ORAL | Status: DC

## 2022-03-11 MED ORDER — TRANEXAMIC ACID 1000 MG/10ML IV SOLN
INTRAVENOUS | Status: DC | PRN
  Administered 2022-03-11: 2000 mg via TOPICAL

## 2022-03-11 MED ORDER — CEFAZOLIN SODIUM-DEXTROSE 2-4 GM/100ML-% IV SOLN
INTRAVENOUS | Status: AC
  Filled 2022-03-11: qty 100

## 2022-03-11 MED ORDER — BUPIVACAINE LIPOSOME 1.3 % IJ SUSP
INTRAMUSCULAR | Status: DC | PRN
  Administered 2022-03-11: 20 mL

## 2022-03-11 MED ORDER — DIPHENHYDRAMINE HCL 12.5 MG/5ML PO ELIX: 12.5000 mg | ORAL_SOLUTION | ORAL | Status: DC | PRN

## 2022-03-11 MED ORDER — ACETAMINOPHEN 325 MG PO TABS: 325.0000 mg | ORAL_TABLET | Freq: Four times a day (QID) | ORAL | Status: DC | PRN

## 2022-03-11 MED ORDER — OXYCODONE HCL 5 MG/5ML PO SOLN: 5.0000 mg | Freq: Once | ORAL | Status: DC | PRN

## 2022-03-11 MED ORDER — FLEET ENEMA 7-19 GM/118ML RE ENEM: 1.0000 | ENEMA | Freq: Once | RECTAL | Status: DC | PRN

## 2022-03-11 MED ORDER — LACTATED RINGERS IV SOLN: INTRAVENOUS | Status: DC | PRN

## 2022-03-11 MED ORDER — MENTHOL 3 MG MT LOZG: 1.0000 | LOZENGE | OROMUCOSAL | Status: DC | PRN

## 2022-03-11 MED ORDER — DEXAMETHASONE SODIUM PHOSPHATE 10 MG/ML IJ SOLN
INTRAMUSCULAR | Status: AC
  Filled 2022-03-11: qty 1

## 2022-03-11 MED ORDER — ORAL CARE MOUTH RINSE: 15.0000 mL | Freq: Once | OROMUCOSAL | Status: AC

## 2022-03-11 MED ORDER — TRANEXAMIC ACID 1000 MG/10ML IV SOLN
2000.0000 mg | INTRAVENOUS | Status: DC
  Filled 2022-03-11: qty 20

## 2022-03-11 MED ORDER — PANTOPRAZOLE SODIUM 40 MG PO TBEC
40.0000 mg | DELAYED_RELEASE_TABLET | Freq: Every day | ORAL | Status: DC
  Administered 2022-03-11 – 2022-03-12 (×2): 40 mg via ORAL
  Filled 2022-03-11 (×2): qty 1

## 2022-03-11 MED ORDER — HYDROMORPHONE HCL 1 MG/ML IJ SOLN: 0.2500 mg | INTRAMUSCULAR | Status: DC | PRN

## 2022-03-11 SURGICAL SUPPLY — 50 items
ATTUNE MED DOME PAT 38 KNEE (Knees) ×1 IMPLANT
ATTUNE PS FEM RT SZ 5 CEM KNEE (Femur) ×1 IMPLANT
ATTUNE PSRP INSR SZ5 6 KNEE (Insert) ×1 IMPLANT
BAG COUNTER SPONGE SURGICOUNT (BAG) ×1 IMPLANT
BAG DECANTER FOR FLEXI CONT (MISCELLANEOUS) ×2 IMPLANT
BAG ZIPLOCK 12X15 (MISCELLANEOUS) ×2 IMPLANT
BASE TIBIA ATTUNE KNEE SYS SZ6 (Knees) IMPLANT
BLADE SAG 18X100X1.27 (BLADE) ×2 IMPLANT
BLADE SAW SGTL 11.0X1.19X90.0M (BLADE) ×2 IMPLANT
BLADE SURG SZ10 CARB STEEL (BLADE) ×4 IMPLANT
BNDG CMPR MED 10X6 ELC LF (GAUZE/BANDAGES/DRESSINGS) ×1
BNDG ELASTIC 6X10 VLCR STRL LF (GAUZE/BANDAGES/DRESSINGS) ×2 IMPLANT
BOWL SMART MIX CTS (DISPOSABLE) ×2 IMPLANT
CEMENT HV SMART SET (Cement) ×4 IMPLANT
COVER SURGICAL LIGHT HANDLE (MISCELLANEOUS) ×2 IMPLANT
CUFF TOURN SGL QUICK 34 (TOURNIQUET CUFF)
CUFF TRNQT CYL 34X4.125X (TOURNIQUET CUFF) ×1 IMPLANT
DRAPE INCISE IOBAN 66X45 STRL (DRAPES) ×2 IMPLANT
DRAPE U-SHAPE 47X51 STRL (DRAPES) ×2 IMPLANT
DRSG AQUACEL AG ADV 3.5X10 (GAUZE/BANDAGES/DRESSINGS) ×2 IMPLANT
DURAPREP 26ML APPLICATOR (WOUND CARE) ×2 IMPLANT
ELECT REM PT RETURN 15FT ADLT (MISCELLANEOUS) ×2 IMPLANT
GLOVE BIO SURGEON STRL SZ7.5 (GLOVE) ×2 IMPLANT
GLOVE BIO SURGEON STRL SZ8.5 (GLOVE) ×2 IMPLANT
GLOVE BIOGEL PI IND STRL 8 (GLOVE) ×1 IMPLANT
GLOVE BIOGEL PI IND STRL 9 (GLOVE) ×1 IMPLANT
GLOVE BIOGEL PI INDICATOR 8 (GLOVE) ×1
GLOVE BIOGEL PI INDICATOR 9 (GLOVE) ×1
GOWN STRL REUS W/ TWL XL LVL3 (GOWN DISPOSABLE) ×2 IMPLANT
GOWN STRL REUS W/TWL XL LVL3 (GOWN DISPOSABLE) ×4
HANDPIECE INTERPULSE COAX TIP (DISPOSABLE) ×2
HOOD PEEL AWAY FLYTE STAYCOOL (MISCELLANEOUS) ×7 IMPLANT
KIT TURNOVER KIT A (KITS) ×1 IMPLANT
NDL HYPO 21X1.5 SAFETY (NEEDLE) ×2 IMPLANT
NEEDLE HYPO 21X1.5 SAFETY (NEEDLE) ×4 IMPLANT
NS IRRIG 1000ML POUR BTL (IV SOLUTION) ×2 IMPLANT
PACK TOTAL KNEE CUSTOM (KITS) ×2 IMPLANT
PROTECTOR NERVE ULNAR (MISCELLANEOUS) ×2 IMPLANT
SET HNDPC FAN SPRY TIP SCT (DISPOSABLE) ×1 IMPLANT
SPIKE FLUID TRANSFER (MISCELLANEOUS) ×6 IMPLANT
SUT VIC AB 1 CTX 36 (SUTURE) ×2
SUT VIC AB 1 CTX36XBRD ANBCTR (SUTURE) ×1 IMPLANT
SUT VIC AB 3-0 CT1 27 (SUTURE) ×6
SUT VIC AB 3-0 CT1 TAPERPNT 27 (SUTURE) ×3 IMPLANT
SYR CONTROL 10ML LL (SYRINGE) ×4 IMPLANT
TIBIA ATTUNE KNEE SYS BASE SZ6 (Knees) ×2 IMPLANT
TRAY CATH INTERMITTENT SS 16FR (CATHETERS) IMPLANT
TRAY FOLEY MTR SLVR 16FR STAT (SET/KITS/TRAYS/PACK) ×2 IMPLANT
WATER STERILE IRR 1000ML POUR (IV SOLUTION) ×4 IMPLANT
WRAP KNEE MAXI GEL POST OP (GAUZE/BANDAGES/DRESSINGS) ×2 IMPLANT

## 2022-03-11 NOTE — Anesthesia Procedure Notes (Signed)
Spinal ? ?Patient location during procedure: OR ?End time: 03/11/2022 1:58 PM ?Reason for block: surgical anesthesia ?Preanesthetic Checklist ?Completed: patient identified, IV checked, site marked, risks and benefits discussed, surgical consent, monitors and equipment checked, pre-op evaluation and timeout performed ?Spinal Block ?Patient position: sitting ?Prep: DuraPrep ?Patient monitoring: heart rate, cardiac monitor, continuous pulse ox and blood pressure ?Approach: midline ?Location: L3-4 ?Injection technique: single-shot ?Needle ?Needle type: Pencan  ?Needle gauge: 24 G ?Needle length: 10 cm ?Assessment ?Sensory level: T4 ?Events: CSF return ? ? ? ?

## 2022-03-11 NOTE — Plan of Care (Signed)
  Problem: Clinical Measurements: Goal: Ability to maintain clinical measurements within normal limits will improve Outcome: Progressing   Problem: Activity: Goal: Risk for activity intolerance will decrease Outcome: Progressing   Problem: Pain Managment: Goal: General experience of comfort will improve Outcome: Progressing   Problem: Safety: Goal: Ability to remain free from injury will improve Outcome: Progressing   

## 2022-03-11 NOTE — Op Note (Signed)
PATIENT ID:      Cynthia Frost  ?MRN:     [id number] ?DOB/AGE:    1951-08-29 / 71 y.o. ? ? ?    OPERATIVE REPORT  ? ?DATE OF PROCEDURE:  03/11/2022   ?   ?PREOPERATIVE DIAGNOSIS:   RGHT KNEEOSTEOARTHRITIS Varus ?    Estimated body mass index is 39.94 kg/m? as calculated from the following: ?  Height as of 03/08/22: '5\' 5"'$  (1.651 m). ?  Weight as of 03/08/22: 108.9 kg. ?                                                      ?POSTOPERATIVE DIAGNOSIS:   Same                                                                ?  ?PROCEDURE:  Procedure(s): ?RIGHT TOTAL KNEE ARTHROPLASTY Using DepuyAttune RP implants #5R Femur, #6Tibia, 6 mm Attune RP bearing, 38 Patella ?   ?SURGEON: Kerin Salen  ?ASSISTANT:   Kerry Hough. Barton Dubois   (Present and scrubbed throughout the case, critical for assistance with exposure, retraction, instrumentation, and closure.)      ?  ?ANESTHESIA: Spinal, 20cc Exparel, 50cc 0.25% Marcaine ?EBL: 350 cc ?FLUID REPLACEMENT: 1600 cc crystaloid ?TOURNIQUET: ?DRAINS: None ?TRANEXAMIC ACID: 1gm IV, 2gm topical ?COMPLICATIONS:  None      ?   ?INDICATIONS FOR PROCEDURE: The patient has ? RGHT KNEEOSTEOARTHRITIS, Var deformities, XR shows bone on bone arthritis, lateral subluxation of tibia. Patient has failed all conservative measures including anti-inflammatory medicines, narcotics, attempts at exercise and weight loss, cortisone injections and viscosupplementation.  Risks and benefits of surgery have been discussed, questions answered.  ? ?DESCRIPTION OF PROCEDURE: The patient identified by armband, received  IV antibiotics, in the holding area at [facility]. Patient taken to the operating room, appropriate anesthetic monitors were attached, and Spinal anesthesia was  induced. IV Tranexamic acid was given.Tourniquet applied high to the operative thigh. Lateral post and foot positioner applied to the table, the lower extremity was then prepped and draped in usual sterile fashion from the toes to the  tourniquet. Time-out procedure was performed. Kerry Hough. [facility] PAC, was present and scrubbed throughout the case, critical for assistance with, positioning, exposure, retraction, instrumentation, and closure.The skin and subcutaneous tissue along the incision was injected with 20 cc of a mixture of Exparel and Marcaine solution, using a 20-gauge by 1-1/2 inch needle. We began the operation, with the knee flexed 130 degrees, by making the anterior midline incision starting at handbreadth above the patella going over the patella 1 cm medial to and 4 cm distal to the tibial tubercle. Small bleeders in the skin and the subcutaneous tissue identified and cauterized. Transverse retinaculum was incised and reflected medially and a medial parapatellar arthrotomy was accomplished. the patella was everted and theprepatellar fat pad resected. The superficial medial collateral ligament was then elevated from anterior to posterior along the proximal flare of the tibia and anterior half of the menisci resected. The knee was hyperflexed exposing bone on bone arthritis. Peripheral and notch osteophytes as well  as the cruciate ligaments were then resected. We continued to work our way around posteriorly along the proximal tibia, and externally rotated the tibia subluxing it out from underneath the femur. A McHale PCL retractor was placed through the notch and a lateral Hohmann retractor placed, and we then entered the proximal tibia in line with the Depuy starter drill in line with the axis of the tibia followed by an intramedullary guide rod and 0-degree posterior slope cutting guide. The tibial cutting guide, 4 degree posterior sloped, was pinned into place allowing resection of 4 mm of bone medially and 12 mm of bone laterally. Satisfied with the tibial resection, we then entered the distal femur 2 mm anterior to the PCL origin with the intramedullary guide rod and applied the distal femoral cutting guide set at 9 mm, with 5  degrees of valgus. This was pinned along the epicondylar axis. At this point, the distal femoral cut was accomplished without difficulty. We then sized for a #5R femoral component and pinned the guide in 3 degrees of external rotation. The chamfer cutting guide was pinned into place. The anterior, posterior, and chamfer cuts were accomplished without difficulty followed by the Attune RP box cutting guide and the box cut. We also removed posterior osteophytes from the posterior femoral condyles. The posterior capsule was injected with Exparel solution. The knee was brought into full extension. We checked our extension gap and fit a 6 mm bearing. Distracting in extension with a lamina spreader,  bleeders in the posterior capsule, Posterior medial and posterior lateral gutter were cauterized.  The transexamic acid-soaked sponge was then placed in the gap of the knee in extension. The knee was flexed 30?Marland Kitchen The posterior patella cut was accomplished with the 9.5 mm Attune cutting guide, sized for a 53m dome, and the fixation pegs drilled.The knee was then once again hyperflexed exposing the proximal tibia. We sized for a # 6 tibial base plate, applied the smokestack and the conical reamer followed by the the Delta fin keel punch. We then hammered into place the Attune RP trial femoral component, drilled the lugs, inserted a  6 mm trial bearing, trial patellar button, and took the knee through range of motion from 0-130 degrees. Medial and lateral ligamentous stability was checked. No thumb pressure was required for patellar Tracking. The tourniquet was not used. All trial components were removed, mating surfaces irrigated with pulse lavage, and dried with suction and sponges. 10 cc of the Exparel solution was applied to the cancellus bone of the patella distal femur and proximal tibia.  After waiting 30 seconds, the bony surfaces were again, dried with sponges. A double batch of DePuy HV cement was mixed and applied to  all bony metallic mating surfaces except for the posterior condyles of the femur itself. In order, we hammered into place the tibial tray and removed excess cement, the femoral component and removed excess cement. The final Attune RP bearing was inserted, and the knee brought to full extension with compression. The patellar button was clamped into place, and excess cement removed. The knee was held at 30? flexion with compression, while the cement cured. The wound was irrigated out with normal saline solution pulse lavage. The rest of the Exparel was injected into the parapatellar arthrotomy, subcutaneous tissues, and periosteal tissues. The parapatellar arthrotomy was closed with running #1 Vicryl suture. The subcutaneous tissue with 3-0 undyed Vicryl suture, and the skin with running 3-0 SQ vicryl. An Aquacil and Ace wrap were applied. The  patient was taken to recovery room without difficulty.  ? ?Kerin Salen ?03/11/2022, 11:44 AM ?  ?

## 2022-03-11 NOTE — Progress Notes (Signed)
Orthopedic Tech Progress Note ?Patient Details:  ?Dejanae Helser ?02-Oct-1951 ?[id number] ? ?Ortho Devices ?Type of Ortho Device: Bone foam zero knee ?Ortho Device/Splint Interventions: Ordered ?  ?  ? ?Akane Tessier E Libby Goehring ?03/11/2022, 4:45 PM ? ?

## 2022-03-11 NOTE — Anesthesia Procedure Notes (Signed)
Anesthesia Regional Block: Adductor canal block  ? ?Pre-Anesthetic Checklist: , timeout performed,  Correct Patient, Correct Site, Correct Laterality,  Correct Procedure, Correct Position, site marked,  Risks and benefits discussed,  Surgical consent,  Pre-op evaluation,  At surgeon's request and post-op pain management ? ?Laterality: Right ? ?Prep: chloraprep     ?  ?Needles:  ?Injection technique: Single-shot ? ?Needle Type: Stimiplex   ? ? ?Needle Length: 9cm  ?Needle Gauge: 21  ? ? ? ?Additional Needles: ? ? ?Procedures:,,,, ultrasound used (permanent image in chart),,    ?Narrative:  ?Start time: 03/11/2022 12:21 PM ?End time: 03/11/2022 12:26 PM ?Injection made incrementally with aspirations every 5 mL. ? ?Performed by: Personally  ?Anesthesiologist: [name], MD ? ? ? ? ?

## 2022-03-11 NOTE — Discharge Instructions (Signed)

## 2022-03-11 NOTE — Interval H&P Note (Signed)
History and Physical Interval Note: ? ?03/11/2022 ?11:43 AM ? ?Cynthia Frost  has presented today for surgery, with the diagnosis of RGHT KNEEOSTEOARTHRITIS.  The various methods of treatment have been discussed with the patient and family. After consideration of risks, benefits and other options for treatment, the patient has consented to  Procedure(s): ?RIGHT TOTAL KNEE ARTHROPLASTY (Right) as a surgical intervention.  The patient's history has been reviewed, patient examined, no change in status, stable for surgery.  I have reviewed the patient's chart and labs.  Questions were answered to the patient's satisfaction.   ? ? ?Kerin Salen ? ? ?

## 2022-03-11 NOTE — Anesthesia Preprocedure Evaluation (Signed)
Anesthesia Evaluation  ?Patient identified by MRN, date of birth, ID band ?Patient awake ? ? ? ?Reviewed: ?Allergy & Precautions, NPO status , Patient's Chart, lab work & pertinent test results ? ?Airway ?Mallampati: I ? ?TM Distance: >3 FB ?Neck ROM: Full ? ? ? Dental ? ?(+) Edentulous Upper, Edentulous Lower, Dental Advisory Given ?  ?Pulmonary ?neg pulmonary ROS,  ?  ?breath sounds clear to auscultation ? ? ? ? ? ? Cardiovascular ?hypertension, Pt. on medications and Pt. on home beta blockers ?negative cardio ROS ? ?+ dysrhythmias Atrial Fibrillation  ?Rhythm:Regular Rate:Normal ? ? ?  ?Neuro/Psych ?Anxiety Depression negative neurological ROS ? negative psych ROS  ? GI/Hepatic ?negative GI ROS, Neg liver ROS,   ?Endo/Other  ?negative endocrine ROSdiabetes, Type 2 ? Renal/GU ?negative Renal ROS  ?negative genitourinary ?  ?Musculoskeletal ?negative musculoskeletal ROS ?(+) Arthritis , Osteoarthritis,   ? Abdominal ?(+) + obese,   ?Peds ?negative pediatric ROS ?(+)  Hematology ?negative hematology ROS ?(+)   ?Anesthesia Other Findings ? ? Reproductive/Obstetrics ?negative OB ROS ? ?  ? ? ? ? ? ? ? ? ? ? ? ? ? ?  ?  ? ? ? ? ? ? ? ? ?Anesthesia Physical ? ?Anesthesia Plan ? ?ASA: III ? ?Anesthesia Plan: Spinal  ? ?Post-op Pain Management: Regional block*  ? ?Induction: Intravenous ? ?PONV Risk Score and Plan: 2 and Ondansetron, Midazolam and Treatment may vary due to age or medical condition ? ?Airway Management Planned: Simple Face Mask ? ?Additional Equipment:  ? ?Intra-op Plan:  ? ?Post-operative Plan:  ? ?Informed Consent: I have reviewed the patients History and Physical, chart, labs and discussed the procedure including the risks, benefits and alternatives for the proposed anesthesia with the patient or authorized representative who has indicated his/her understanding and acceptance.  ? ? ? ?Dental advisory given ? ?Plan Discussed with: CRNA, Anesthesiologist and  Surgeon ? ?Anesthesia Plan Comments:   ? ? ? ? ? ? ?Anesthesia Quick Evaluation ? ?

## 2022-03-11 NOTE — Transfer of Care (Signed)
Immediate Anesthesia Transfer of Care Note ? ?Patient: Jeanett Antonopoulos ? ?Procedure(s) Performed: RIGHT TOTAL KNEE ARTHROPLASTY (Right: Knee) ? ?Patient Location: PACU ? ?Anesthesia Type:MAC, Regional and Spinal ? ?Level of Consciousness: awake, alert  and oriented ? ?Airway & Oxygen Therapy: Patient Spontanous Breathing ? ?Post-op Assessment: Report given to RN and Post -op Vital signs reviewed and stable ? ?Post vital signs: Reviewed and stable ? ?Last Vitals:  ?Vitals Value Taken Time  ?BP 124/70 03/11/22 1609  ?Temp    ?Pulse 63 03/11/22 1614  ?Resp 23 03/11/22 1614  ?SpO2 95 % 03/11/22 1614  ?Vitals shown include unvalidated device data. ? ?Last Pain:  ?Vitals:  ? 03/11/22 1230  ?TempSrc:   ?PainSc: 0-No pain  ?   ? ?Patients Stated Pain Goal: 2 (03/11/22 1057) ? ?Complications: No notable events documented. ?

## 2022-03-12 ENCOUNTER — Encounter (HOSPITAL_COMMUNITY): Payer: Self-pay | Admitting: Orthopedic Surgery

## 2022-03-12 DIAGNOSIS — Z7902 Long term (current) use of antithrombotics/antiplatelets: Secondary | ICD-10-CM | POA: Diagnosis not present

## 2022-03-12 DIAGNOSIS — E119 Type 2 diabetes mellitus without complications: Secondary | ICD-10-CM | POA: Diagnosis not present

## 2022-03-12 DIAGNOSIS — M1711 Unilateral primary osteoarthritis, right knee: Secondary | ICD-10-CM | POA: Diagnosis not present

## 2022-03-12 DIAGNOSIS — Z7901 Long term (current) use of anticoagulants: Secondary | ICD-10-CM | POA: Diagnosis not present

## 2022-03-12 DIAGNOSIS — I10 Essential (primary) hypertension: Secondary | ICD-10-CM | POA: Diagnosis not present

## 2022-03-12 DIAGNOSIS — Z79899 Other long term (current) drug therapy: Secondary | ICD-10-CM | POA: Diagnosis not present

## 2022-03-12 MED ORDER — TIZANIDINE HCL 2 MG PO CAPS: 4.0000 mg | ORAL_CAPSULE | Freq: Three times a day (TID) | ORAL | 0 refills | Status: DC | PRN

## 2022-03-12 MED ORDER — OXYCODONE HCL 5 MG PO TABS: 5.0000 mg | ORAL_TABLET | ORAL | 0 refills | Status: AC | PRN

## 2022-03-12 NOTE — Progress Notes (Signed)
PATIENT ID: Cynthia Frost  MRN: [id number]  DOB/AGE:  06-15-1951 / 71 y.o.  ?1 Day Post-Op Procedure(s) (LRB): ?RIGHT TOTAL KNEE ARTHROPLASTY (Right) ? ?  PROGRESS NOTE  ?Subjective: ?Patient is alert, oriented, no Nausea, no Vomiting, yes passing gas. Taking PO well. ?Denies SOB, Chest or Calf Pain. Using Incentive Spirometer, PAS in place. ?Ambulate WBAT, Patient reports pain as 2/10 .   ? ?Objective: ?Vital signs in last 24 hours: ?Vitals:  ? 03/11/22 1933 03/11/22 2244 03/12/22 0221 03/12/22 0641  ?BP: (!) 136/57 (!) 95/48 126/65 133/61  ?Pulse: 67 (!) 53 64 69  ?Resp: '16 16 16 17  '$ ?Temp: 97.6 ?F (36.4 ?C) 97.6 ?F (36.4 ?C) 98.1 ?F (36.7 ?C) 98.3 ?F (36.8 ?C)  ?TempSrc: Oral Oral Oral Oral  ?SpO2: 97% 99% 95% 98%  ?Weight:      ?Height:      ?  ?  ?Intake/Output from previous day: ?I/O last 3 completed shifts: ?In: 3368.3 [P.O.:600; I.V.:2568.3; IV Piggyback:200] ?Out: 3100 [UXNAT:5573; Blood:75] ?  ?Intake/Output this shift: ?No intake/output data recorded.  ? ?LABORATORY DATA: ?Recent Labs  ?  03/11/22 ?2202  ?GLUCAP 117*  ? ? ?Examination: ?Neurologically intact ?ABD soft ?Neurovascular intact ?Sensation intact distally ?Intact pulses distally ?Dorsiflexion/Plantar flexion intact ?Incision: dressing C/D/I ?No cellulitis present ?Compartment soft} ? ?Assessment:   ?1 Day Post-Op Procedure(s) (LRB): ?RIGHT TOTAL KNEE ARTHROPLASTY (Right) ?ADDITIONAL DIAGNOSIS: Expected Acute Blood Loss Anemia, Pulm HTN, DM, RMCA stenosis ? ?Patient's anticipated LOS is less than 2 midnights, meeting these requirements: ?- Younger than 44 ?- Lives within 1 hour of care ?- Has a competent adult at home to recover with post-op recover ?- NO history of ? - Chronic pain requiring opiods ? - Diabetes ? - Coronary Artery Disease ? - Heart failure ? - Heart attack ? - Stroke ? - DVT/VTE ? - Cardiac arrhythmia ? - Respiratory Failure/COPD ? - Renal failure ? - Anemia ? - Advanced Liver disease ? ?  ? ?Plan: ?PT/OT WBAT, AROM and PROM   ?DVT Prophylaxis:  SCDx72hrs, ASA 81 mg BID x 2 weeks ?DISCHARGE PLAN: Home ?DISCHARGE NEEDS: HHPT, Walker, and 3-in-1 comode seat ? ?   ?Kerin Salen ?03/12/2022, 7:44 AM ?  ? ?

## 2022-03-12 NOTE — Evaluation (Signed)
Physical Therapy Evaluation ?Patient Details ?Name: Cynthia Frost ?MRN: [id number] ?DOB: [birth year] ?Today's Date: 03/12/2022 ? ?History of Present Illness ? 71 yo female s/p R TKA on 03/11/22. PMH: HTN, DM, depression, anxiety  ?Clinical Impression ? Pt is s/p TKA resulting in the deficits listed below (see PT Problem List).  ?Pt is doing very well thi session. Reviewed mobility areas below. Pt is ready tod/c from PT standpoint with family assist prn.  ? Pt will benefit from skilled PT to increase their independence and safety with mobility to allow discharge to the venue listed below.  ?   ?   ? ?Recommendations for follow up therapy are one component of a multi-disciplinary discharge planning process, led by the attending physician.  Recommendations may be updated based on patient status, additional functional criteria and insurance authorization. ? ?Follow Up Recommendations Follow physician's recommendations for discharge plan and follow up therapies ? ?  ?Assistance Recommended at Discharge Intermittent Supervision/Assistance  ?Patient can return home with the following ? Help with stairs or ramp for entrance;Assist for transportation;Assistance with cooking/housework ? ?  ?Equipment Recommendations None recommended by PT  ?Recommendations for Other Services ?    ?  ?Functional Status Assessment Patient has had a recent decline in their functional status and demonstrates the ability to make significant improvements in function in a reasonable and predictable amount of time.  ? ?  ?Precautions / Restrictions Precautions ?Precautions: Fall;Knee ?Restrictions ?Weight Bearing Restrictions: No ?RLE Weight Bearing: Weight bearing as tolerated  ? ?  ? ?Mobility ? Bed Mobility ?Overal bed mobility: Modified Independent ?  ?  ?  ?  ?  ?  ?  ?  ? ?Transfers ?Overall transfer level: Needs assistance ?Equipment used: Rolling walker (2 wheels) ?Transfers: Sit to/from Stand ?Sit to Stand: Supervision ?  ?  ?  ?  ?  ?General  transfer comment: cues for hand placement and RLE position ?  ? ?Ambulation/Gait ?Ambulation/Gait assistance: Min guard, Supervision ?Gait Distance (Feet): 80 Feet ?Assistive device: Rolling walker (2 wheels) ?Gait Pattern/deviations: Step-to pattern ?  ?  ?  ?General Gait Details: cues for trunk extension, RW position. good carryover during session ? ?Stairs ?  ?  ?  ?  ?  ? ?Wheelchair Mobility ?  ? ?Modified Rankin (Stroke Patients Only) ?  ? ?  ? ?Balance   ?  ?  ?  ?  ?  ?  ?  ?  ?  ?  ?  ?  ?  ?  ?  ?  ?  ?  ?   ? ? ? ?Pertinent Vitals/Pain Pain Assessment ?Pain Assessment: 0-10 ?Pain Score: 4  ?Pain Location: right knee ?Pain Descriptors / Indicators: Aching, Grimacing, Sore ?Pain Intervention(s): Premedicated before session, Repositioned, Monitored during session, Limited activity within patient's tolerance, Ice applied  ? ? ?Home Living Family/patient expects to be discharged to:: Private residence ?Living Arrangements: Spouse/significant other ?Available Help at Discharge: Family ?Type of Home: House ?Home Access: Level entry (small threshold) ?  ?  ?  ?Home Layout: Two level;Other (Comment) (placing bed downstairs temporarily) ?Home Equipment: Conservation officer, nature (2 wheels) ?   ?  ?Prior Function Prior Level of Function : Independent/Modified Independent ?  ?  ?  ?  ?  ?  ?  ?  ?  ? ? ?Hand Dominance  ?   ? ?  ?Extremity/Trunk Assessment  ? Upper Extremity Assessment ?Upper Extremity Assessment: Defer to OT evaluation ?  ? ?Lower Extremity Assessment ?  Lower Extremity Assessment: RLE deficits/detail ?RLE Deficits / Details: ankle WFL, knee and hip 2+ to  3/5 grossly. right knee flexion 10 to 65 degrees ?  ? ?   ?Communication  ? Communication: No difficulties  ?Cognition Arousal/Alertness: Awake/alert ?Behavior During Therapy: [facility] for tasks assessed/performed ?Overall Cognitive Status: Within Functional Limits for tasks assessed ?  ?  ?  ?  ?  ?  ?  ?  ?  ?  ?  ?  ?  ?  ?  ?  ?  ?  ?  ? ?  ?General Comments    ? ?  ?Exercises Total Joint Exercises ?Ankle Circles/Pumps: AROM, Both, 10 reps ?Quad Sets: AROM, Both, 10 reps ?Heel Slides: AAROM, Right, 10 reps ?Straight Leg Raises: AROM, AAROM, Right, 5 reps, Limitations ?Straight Leg Raises Limitations: pain ?Long Arc Quad: AROM, 10 reps, Right, Seated  ? ?Assessment/Plan  ?  ?PT Assessment All further PT needs can be met in the next venue of care  ?PT Problem List   ? ?   ?  ?PT Treatment Interventions     ? ?PT Goals (Current goals can be found in the Care Plan section)  ?Acute Rehab PT Goals ?Patient Stated Goal: home today! et back to water aerobics ?PT Goal Formulation: All assessment and education complete, DC therapy ? ?  ?Frequency   ?  ? ? ?Co-evaluation   ?  ?  ?  ?  ? ? ?  ?AM-PAC PT "6 Clicks" Mobility  ?Outcome Measure Help needed turning from your back to your side while in a flat bed without using bedrails?: None ?Help needed moving from lying on your back to sitting on the side of a flat bed without using bedrails?: None ?Help needed moving to and from a bed to a chair (including a wheelchair)?: None ?  ?Help needed to walk in hospital room?: A Little ?Help needed climbing 3-5 steps with a railing? : A Little ?6 Click Score: 18 ? ?  ?End of Session Equipment Utilized During Treatment: Gait belt ?Activity Tolerance: Patient tolerated treatment well ?Patient left: in bed;with call bell/phone within reach;with bed alarm set ?Nurse Communication: Mobility status ?PT Visit Diagnosis: Other abnormalities of gait and mobility (R26.89);Difficulty in walking, not elsewhere classified (R26.2) ?  ? ?Time: 3419-6222 ?PT Time Calculation (min) (ACUTE ONLY): 25 min ? ? ?Charges:   PT Evaluation ?$PT Eval Low Complexity: 1 Low ?PT Treatments ?$Therapeutic Exercise: 8-22 mins ?  ?   ? ? ?Baxter Flattery, PT ? ?Acute Rehab Dept [facility]) [phone] ?Pager [phone] ? ?03/12/2022 ? ? ?Cynthia Frost ?03/12/2022, 11:01 AM ? ?

## 2022-03-12 NOTE — Plan of Care (Signed)

## 2022-03-12 NOTE — TOC Transition Note (Signed)
Transition of Care (TOC) - CM/SW Discharge Note ? ? ?Patient Details  ?Name: Jaylene Arrowood ?MRN: [id number] ?Date of Birth: 1951-01-13 ? ?Transition of Care (TOC) CM/SW Contact:  ?Khalee Mazo, LCSW ?Phone Number: ?03/12/2022, 10:33 AM ? ? ?Clinical Narrative:    ? ?Met with pt and confirming receipt of rolling walker via [employer].  Plan for OPPT at [facility]  No TOC needs. ? ?Final next level of care: OP Rehab ?Barriers to Discharge: No Barriers Identified ? ? ?Patient Goals and CMS Choice ?Patient states their goals for this hospitalization and ongoing recovery are:: return home ?  ?  ? ?Discharge Placement ?  ?           ?  ?  ?  ?  ? ?Discharge Plan and Services ?  ?  ?           ?DME Arranged: Walker rolling ?DME Agency: [employer] ?  ?  ?  ?  ?  ?  ?  ?  ? ?Social Determinants of Health (SDOH) Interventions ?  ? ? ?Readmission Risk Interventions ?   ? View : No data to display.  ?  ?  ?  ? ? ? ? ? ?

## 2022-03-12 NOTE — Discharge Summary (Signed)
.  dcs1 ? ?

## 2022-03-12 NOTE — Anesthesia Postprocedure Evaluation (Signed)
Anesthesia Post Note ? ?Patient: Cynthia Frost ? ?Procedure(s) Performed: RIGHT TOTAL KNEE ARTHROPLASTY (Right: Knee) ? ?  ? ?Patient location during evaluation: PACU ?Anesthesia Type: Spinal ?Level of consciousness: awake and alert ?Pain management: pain level controlled ?Vital Signs Assessment: post-procedure vital signs reviewed and stable ?Respiratory status: spontaneous breathing, nonlabored ventilation and respiratory function stable ?Cardiovascular status: blood pressure returned to baseline and stable ?Postop Assessment: no apparent nausea or vomiting ?Anesthetic complications: no ? ? ?No notable events documented. ? ?Last Vitals:  ?Vitals:  ? 03/12/22 0221 03/12/22 0641  ?BP: 126/65 133/61  ?Pulse: 64 69  ?Resp: 16 17  ?Temp: 36.7 ?C 36.8 ?C  ?SpO2: 95% 98%  ?  ?Last Pain:  ?Vitals:  ? 03/12/22 0641  ?TempSrc: Oral  ?PainSc:   ? ? ?  ?  ?  ?  ?  ?  ? ?[name] ? ? ? ? ?

## 2022-03-12 NOTE — Progress Notes (Signed)
Provided discharge education/instructions, all questions and concerns addressed. RW delivered to room, Pt not in acute distress, to discharge home with belongings accompanied by husband. ?

## 2022-03-13 DIAGNOSIS — M6281 Muscle weakness (generalized): Secondary | ICD-10-CM | POA: Diagnosis not present

## 2022-03-13 DIAGNOSIS — Z96651 Presence of right artificial knee joint: Secondary | ICD-10-CM | POA: Diagnosis not present

## 2022-03-13 DIAGNOSIS — M25661 Stiffness of right knee, not elsewhere classified: Secondary | ICD-10-CM | POA: Diagnosis not present

## 2022-03-19 DIAGNOSIS — M6281 Muscle weakness (generalized): Secondary | ICD-10-CM | POA: Diagnosis not present

## 2022-03-19 DIAGNOSIS — Z96651 Presence of right artificial knee joint: Secondary | ICD-10-CM | POA: Diagnosis not present

## 2022-03-19 DIAGNOSIS — M25661 Stiffness of right knee, not elsewhere classified: Secondary | ICD-10-CM | POA: Diagnosis not present

## 2022-03-21 ENCOUNTER — Other Ambulatory Visit (HOSPITAL_COMMUNITY): Payer: Self-pay | Admitting: Orthopedic Surgery

## 2022-03-21 ENCOUNTER — Ambulatory Visit (HOSPITAL_COMMUNITY)
Admission: RE | Admit: 2022-03-21 | Discharge: 2022-03-21 | Disposition: A | Discharge status: Home / Self Care | Payer: Medicare Other | Source: Ambulatory Visit | Attending: Orthopedic Surgery | Admitting: Orthopedic Surgery

## 2022-03-21 DIAGNOSIS — Z96651 Presence of right artificial knee joint: Secondary | ICD-10-CM | POA: Diagnosis not present

## 2022-03-21 DIAGNOSIS — M7989 Other specified soft tissue disorders: Secondary | ICD-10-CM | POA: Insufficient documentation

## 2022-03-21 DIAGNOSIS — Z471 Aftercare following joint replacement surgery: Secondary | ICD-10-CM | POA: Diagnosis not present

## 2022-03-21 DIAGNOSIS — M79604 Pain in right leg: Secondary | ICD-10-CM | POA: Diagnosis not present

## 2022-03-21 NOTE — Progress Notes (Signed)
Right lower extremity venous duplex completed. ?Refer to "CV Proc" under chart review to view preliminary results. ? ?03/21/2022 3:51 PM ?Kelby Aline., MHA, RVT, RDCS, RDMS   ?

## 2022-03-26 ENCOUNTER — Other Ambulatory Visit: Payer: Self-pay | Admitting: Orthopedic Surgery

## 2022-03-26 DIAGNOSIS — M6281 Muscle weakness (generalized): Secondary | ICD-10-CM | POA: Diagnosis not present

## 2022-03-26 DIAGNOSIS — M25661 Stiffness of right knee, not elsewhere classified: Secondary | ICD-10-CM | POA: Diagnosis not present

## 2022-03-26 DIAGNOSIS — Z96651 Presence of right artificial knee joint: Secondary | ICD-10-CM | POA: Diagnosis not present

## 2022-03-28 DIAGNOSIS — Z96651 Presence of right artificial knee joint: Secondary | ICD-10-CM | POA: Diagnosis not present

## 2022-03-28 DIAGNOSIS — M25661 Stiffness of right knee, not elsewhere classified: Secondary | ICD-10-CM | POA: Diagnosis not present

## 2022-03-28 DIAGNOSIS — M6281 Muscle weakness (generalized): Secondary | ICD-10-CM | POA: Diagnosis not present

## 2022-04-03 ENCOUNTER — Other Ambulatory Visit: Payer: Self-pay | Admitting: General Practice

## 2022-04-03 DIAGNOSIS — Z1211 Encounter for screening for malignant neoplasm of colon: Secondary | ICD-10-CM | POA: Diagnosis not present

## 2022-04-03 DIAGNOSIS — I4891 Unspecified atrial fibrillation: Secondary | ICD-10-CM

## 2022-04-04 DIAGNOSIS — M6281 Muscle weakness (generalized): Secondary | ICD-10-CM | POA: Diagnosis not present

## 2022-04-04 DIAGNOSIS — M25661 Stiffness of right knee, not elsewhere classified: Secondary | ICD-10-CM | POA: Diagnosis not present

## 2022-04-04 DIAGNOSIS — Z96651 Presence of right artificial knee joint: Secondary | ICD-10-CM | POA: Diagnosis not present

## 2022-04-11 DIAGNOSIS — Z96651 Presence of right artificial knee joint: Secondary | ICD-10-CM | POA: Diagnosis not present

## 2022-04-11 DIAGNOSIS — M6281 Muscle weakness (generalized): Secondary | ICD-10-CM | POA: Diagnosis not present

## 2022-04-11 DIAGNOSIS — M25661 Stiffness of right knee, not elsewhere classified: Secondary | ICD-10-CM | POA: Diagnosis not present

## 2022-04-16 DIAGNOSIS — M25661 Stiffness of right knee, not elsewhere classified: Secondary | ICD-10-CM | POA: Diagnosis not present

## 2022-04-16 DIAGNOSIS — M6281 Muscle weakness (generalized): Secondary | ICD-10-CM | POA: Diagnosis not present

## 2022-04-16 DIAGNOSIS — Z96651 Presence of right artificial knee joint: Secondary | ICD-10-CM | POA: Diagnosis not present

## 2022-04-22 NOTE — Progress Notes (Signed)
DUE TO COVID-19 ONLY  2  VISITOR IS ALLOWED TO COME WITH YOU AND STAY IN THE WAITING ROOM ONLY DURING PRE OP AND PROCEDURE DAY OF SURGERY.  4  VISITOR  MAY VISIT WITH YOU AFTER SURGERY IN YOUR PRIVATE ROOM DURING VISITING HOURS ONLY! YOU MAY HAVE ONE PERSON SPEND THE NITE WITH YOU IN YOUR ROOM AFTER SURGERY.     Your procedure is scheduled on:         05/06/22   Report to [facility] Main  Entrance   Report to admitting at   0515              AM DO NOT [place], PICTURE ID OR WALLET DAY OF SURGERY.      Call this number if you have problems the morning of surgery [phone]    REMEMBER: NO  SOLID FOODS , CANDY, GUM OR MINTS AFTER [place] .       Marland Kitchen CLEAR LIQUIDS UNTIL     0415AM            DAY OF SURGERY.      PLEASE FINISH ENSURE DRINK PER SURGEON ORDER  WHICH NEEDS TO BE COMPLETED AT        0415AM   MORNING OF SURGERY.       CLEAR LIQUID DIET   Foods Allowed      WATER BLACK COFFEE ( SUGAR OK, NO MILK, CREAM OR CREAMER) REGULAR AND DECAF  TEA ( SUGAR OK NO MILK, CREAM, OR CREAMER) REGULAR AND DECAF  PLAIN JELLO ( NO RED)  FRUIT ICES ( NO RED, NO FRUIT PULP)  POPSICLES ( NO RED)  JUICE- APPLE, WHITE GRAPE AND WHITE CRANBERRY  SPORT DRINK LIKE GATORADE ( NO RED)  CLEAR BROTH ( VEGETABLE , CHICKEN OR BEEF)                                                                     BRUSH YOUR TEETH MORNING OF SURGERY AND RINSE YOUR MOUTH OUT, NO CHEWING GUM CANDY OR MINTS.     Take these medicines the morning of surgery with A SIP OF WATER:  AMLODIPINE, COREG, DOXAZOSIN, CYMBALTA    DO NOT TAKE ANY DIABETIC MEDICATIONS DAY OF YOUR SURGERY                               You may not have any metal on your body including hair pins and              piercings  Do not wear jewelry, make-up, lotions, powders or perfumes, deodorant             Do not wear nail polish on your fingernails.              IF YOU ARE A FEMALE AND WANT TO SHAVE UNDER  ARMS OR LEGS PRIOR TO SURGERY YOU MUST DO SO AT LEAST 48 HOURS PRIOR TO SURGERY.              Men may shave face and neck.   Do not bring valuables to the hospital. [facility] IS NOT  RESPONSIBLE   FOR VALUABLES.  Contacts, dentures or bridgework may not be worn into surgery.  Leave suitcase in the car. After surgery it may be brought to your room.     Patients discharged the day of surgery will not be allowed to drive home. IF YOU ARE HAVING SURGERY AND GOING HOME THE SAME DAY, YOU MUST HAVE AN ADULT TO DRIVE YOU HOME AND BE WITH YOU FOR 24 HOURS. YOU MAY GO HOME BY TAXI OR UBER OR ORTHERWISE, BUT AN ADULT MUST ACCOMPANY YOU HOME AND STAY WITH YOU FOR 24 HOURS.                Please read over the following fact sheets you were given: _____________________________________________________________________  [facility] - Preparing for Surgery Before surgery, you can play an important role.  Because skin is not sterile, your skin needs to be as free of germs as possible.  You can reduce the number of germs on your skin by washing with CHG (chlorahexidine gluconate) soap before surgery.  CHG is an antiseptic cleaner which kills germs and bonds with the skin to continue killing germs even after washing. Please DO NOT use if you have an allergy to CHG or antibacterial soaps.  If your skin becomes reddened/irritated stop using the CHG and inform your nurse when you arrive at Short Stay. Do not shave (including legs and underarms) for at least 48 hours prior to the first CHG shower.  You may shave your face/neck. Please follow these instructions carefully:  1.  Shower with CHG Soap the night before surgery and the  morning of Surgery.  2.  If you choose to wash your hair, wash your hair first as usual with your  normal  shampoo.  3.  After you shampoo, rinse your hair and body thoroughly to remove the  shampoo.                           4.  Use CHG as you would any other liquid soap.  You  can apply chg directly  to the skin and wash                       Gently with a scrungie or clean washcloth.  5.  Apply the CHG Soap to your body ONLY FROM THE NECK DOWN.   Do not use on face/ open                           Wound or open sores. Avoid contact with eyes, ears mouth and genitals (private parts).                       Wash face,  Genitals (private parts) with your normal soap.             6.  Wash thoroughly, paying special attention to the area where your surgery  will be performed.  7.  Thoroughly rinse your body with warm water from the neck down.  8.  DO NOT shower/wash with your normal soap after using and rinsing off  the CHG Soap.                9.  Pat yourself dry with a clean towel.            10.  Wear clean pajamas.  11.  Place clean sheets on your bed the night of your first shower and do not  sleep with pets. Day of Surgery : Do not apply any lotions/deodorants the morning of surgery.  Please wear clean clothes to the hospital/surgery center.  FAILURE TO FOLLOW THESE INSTRUCTIONS MAY RESULT IN THE CANCELLATION OF YOUR SURGERY PATIENT SIGNATURE_________________________________  NURSE SIGNATURE__________________________________  ________________________________________________________________________

## 2022-04-22 NOTE — Progress Notes (Signed)
Anesthesia Review:  PCP: rOB eHINGER  Cardiologist : Chest x-ray : 2V- 09/29/21  EKG : 10/01/21  Echo :12/13/20  Stress test:2017  Cardiac Cath :  Activity level:  Sleep Study/ CPAP : Fasting Blood Sugar :      / Checks Blood Sugar -- times a day:   Blood Thinner/ Instructions /Last Dose: ASA / Instructions/ Last Dose :   ELIQUIS AND PLAVIX

## 2022-04-23 DIAGNOSIS — Z96651 Presence of right artificial knee joint: Secondary | ICD-10-CM | POA: Diagnosis not present

## 2022-04-23 DIAGNOSIS — M25661 Stiffness of right knee, not elsewhere classified: Secondary | ICD-10-CM | POA: Diagnosis not present

## 2022-04-23 DIAGNOSIS — M6281 Muscle weakness (generalized): Secondary | ICD-10-CM | POA: Diagnosis not present

## 2022-04-24 ENCOUNTER — Other Ambulatory Visit: Payer: Self-pay

## 2022-04-24 ENCOUNTER — Encounter (HOSPITAL_COMMUNITY): Payer: Self-pay

## 2022-04-24 ENCOUNTER — Encounter (HOSPITAL_COMMUNITY)
Admission: RE | Admit: 2022-04-24 | Discharge: 2022-04-24 | Disposition: A | Discharge status: Home / Self Care | Payer: Medicare Other | Source: Ambulatory Visit | Attending: Orthopedic Surgery | Admitting: Orthopedic Surgery

## 2022-04-24 DIAGNOSIS — Z01818 Encounter for other preprocedural examination: Secondary | ICD-10-CM | POA: Insufficient documentation

## 2022-04-24 DIAGNOSIS — R7303 Prediabetes: Secondary | ICD-10-CM | POA: Insufficient documentation

## 2022-04-24 HISTORY — DX: Prediabetes: R73.03

## 2022-04-24 LAB — CBC
HCT: 38.5 % (ref 36.0–46.0)
Hemoglobin: 12.7 g/dL (ref 12.0–15.0)
MCH: 30.2 pg (ref 26.0–34.0)
MCHC: 33 g/dL (ref 30.0–36.0)
MCV: 91.7 fL (ref 80.0–100.0)
Platelets: 241 10*3/uL (ref 150–400)
RBC: 4.2 MIL/uL (ref 3.87–5.11)
RDW: 15 % (ref 11.5–15.5)
WBC: 7.4 10*3/uL (ref 4.0–10.5)
nRBC: 0 % (ref 0.0–0.2)

## 2022-04-24 LAB — BASIC METABOLIC PANEL
Anion gap: 7 (ref 5–15)
BUN: 15 mg/dL (ref 8–23)
CO2: 26 mmol/L (ref 22–32)
Calcium: 9.4 mg/dL (ref 8.9–10.3)
Chloride: 103 mmol/L (ref 98–111)
Creatinine, Ser: 0.53 mg/dL (ref 0.44–1.00)
GFR, Estimated: >60 mL/min (ref >60)
Glucose, Bld: 109 mg/dL — ABNORMAL HIGH (ref 70–99)
Potassium: 3.3 mmol/L — ABNORMAL LOW (ref 3.5–5.1)
Sodium: 136 mmol/L (ref 135–145)

## 2022-04-24 LAB — GLUCOSE, CAPILLARY: Glucose-Capillary: 112 mg/dL — ABNORMAL HIGH (ref 70–99)

## 2022-04-24 LAB — SURGICAL PCR SCREEN
MRSA, PCR: NEGATIVE
Staphylococcus aureus: NEGATIVE

## 2022-04-24 LAB — TYPE AND SCREEN
ABO/RH(D): O POS
Antibody Screen: NEGATIVE

## 2022-04-25 ENCOUNTER — Encounter (INDEPENDENT_AMBULATORY_CARE_PROVIDER_SITE_OTHER): Payer: Self-pay | Admitting: Ophthalmology

## 2022-04-25 ENCOUNTER — Encounter (HOSPITAL_COMMUNITY): Payer: Self-pay | Admitting: Physician Assistant

## 2022-04-25 ENCOUNTER — Ambulatory Visit (INDEPENDENT_AMBULATORY_CARE_PROVIDER_SITE_OTHER): Payer: Medicare Other | Admitting: Ophthalmology

## 2022-04-25 DIAGNOSIS — H2511 Age-related nuclear cataract, right eye: Secondary | ICD-10-CM | POA: Diagnosis not present

## 2022-04-25 DIAGNOSIS — H25013 Cortical age-related cataract, bilateral: Secondary | ICD-10-CM | POA: Diagnosis not present

## 2022-04-25 DIAGNOSIS — E113592 Type 2 diabetes mellitus with proliferative diabetic retinopathy without macular edema, left eye: Secondary | ICD-10-CM

## 2022-04-25 DIAGNOSIS — H2513 Age-related nuclear cataract, bilateral: Secondary | ICD-10-CM | POA: Diagnosis not present

## 2022-04-25 DIAGNOSIS — E113591 Type 2 diabetes mellitus with proliferative diabetic retinopathy without macular edema, right eye: Secondary | ICD-10-CM | POA: Insufficient documentation

## 2022-04-25 DIAGNOSIS — H25043 Posterior subcapsular polar age-related cataract, bilateral: Secondary | ICD-10-CM | POA: Diagnosis not present

## 2022-04-25 DIAGNOSIS — H18413 Arcus senilis, bilateral: Secondary | ICD-10-CM | POA: Diagnosis not present

## 2022-04-25 DIAGNOSIS — H4311 Vitreous hemorrhage, right eye: Secondary | ICD-10-CM | POA: Insufficient documentation

## 2022-04-25 DIAGNOSIS — H2512 Age-related nuclear cataract, left eye: Secondary | ICD-10-CM | POA: Insufficient documentation

## 2022-04-25 HISTORY — DX: Type 2 diabetes mellitus with proliferative diabetic retinopathy without macular edema, left eye: E11.3592

## 2022-04-25 NOTE — Assessment & Plan Note (Addendum)
OD will likely need ultimately vitrectomy to clear this central vitreous opacity which has been worsening per patient report over the last 7 to 8 months.  I will recommend cataract extraction with intraocular ends placement first under direction of Dr. Talbert Forest.  This may hasten spontaneous resolution of hemorrhage but more importantly allow for delivery of peripheral PRP so as to halt the etiology of the hemorrhage.  Moreover should vitrectomy be undertaken a clear review would be in place to allow this to happen and have the best ultimate visual acuity outcome in this patient's care

## 2022-04-25 NOTE — Assessment & Plan Note (Signed)
Temporally, avascular retina, neovascular fronds in the watershed regions of the retina.  Most likely diabetic retinopathy even though there is a paucity remainder of diabetic retinal changes.  OS will need local peripheral PRP in this region to quiet the retinopathy and prevent neovascular disease progression temporally OS.  No holes or tears are seen

## 2022-04-25 NOTE — Assessment & Plan Note (Addendum)
The nature of cataract was discussed with the patient as well as the elective nature of surgery. The patient was reassured that surgery at a later date does not put the patient at risk for a worse outcome. It was emphasized that the need for surgery is dictated by the patient's quality of life as influenced by the cataract. Patient was instructed to maintain close follow up with their general eye care doctor.  Recommend cataract surgery as soon as possible within 5-6 weeks.  Right eye first.  This will allow media clearance should vitrectomy ultimately need to be performed but also to allow for visualization periphery to deliver peripheral PRP to NVE

## 2022-04-25 NOTE — Progress Notes (Addendum)
04/25/2022     CHIEF COMPLAINT Patient presents for No chief complaint on file.     HISTORY OF PRESENT ILLNESS: Cynthia Frost is a 71 y.o. female who presents to the clinic today for:   HPI   NP Vit Hem, referred by Dr. Talbert Forest (B Scan Room). Patient reports "I have blurred vision in my right eye, I have floaters, like spiderwebs. It has been going on for about 9 months. I didn't know I had blood in the back of my eye." Last edited by Laurin Coder on 04/25/2022  3:48 PM.      Referring physician: Darleen Crocker, MD [address] [place],  [place] [id number]  HISTORICAL INFORMATION:   Selected notes from the MEDICAL RECORD NUMBER    Lab Results  Component Value Date   HGBA1C 6.4 (H) 03/08/2022     CURRENT MEDICATIONS: No current outpatient medications on file. (Ophthalmic Drugs)   No current facility-administered medications for this visit. (Ophthalmic Drugs)   Current Outpatient Medications (Other)  Medication Sig   acetaminophen (TYLENOL) 500 MG tablet Take 1,000 mg by mouth every 6 (six) hours as needed for mild pain or moderate pain.   amLODipine (NORVASC) 10 MG tablet TAKE 1 TABLET DAILY   apixaban (ELIQUIS) 2.5 MG TABS tablet Take 1 tablet (2.5 mg total) by mouth 2 (two) times daily. (Patient not taking: Reported on 04/16/2022)   apixaban (ELIQUIS) 5 MG TABS tablet Take 5 mg by mouth 2 (two) times daily.   atorvastatin (LIPITOR) 10 MG tablet Take 10 mg by mouth 3 (three) times a week.   Boswellia-Glucosamine-Vit D (OSTEO BI-FLEX ONE PER DAY PO) Take 2 tablets by mouth daily.   carvedilol (COREG) 25 MG tablet Take 0.5 tablets (12.5 mg total) by mouth daily. (Patient taking differently: Take 25 mg by mouth 2 (two) times daily with a meal.)   cetirizine (ZYRTEC ALLERGY) 10 MG tablet Take 1 tablet (10 mg total) by mouth daily. (Patient not taking: Reported on 03/05/2022)   Cholecalciferol (VITAMIN D3) 75 MCG (3000 UT) TABS Take 1 tablet by mouth daily.  (Patient not taking: Reported on 04/16/2022)   clopidogrel (PLAVIX) 75 MG tablet Take 75 mg by mouth once.   cyanocobalamin 100 MCG tablet Take 100 mcg by mouth daily.   doxazosin (CARDURA) 8 MG tablet Take 8 mg by mouth daily.    DULoxetine (CYMBALTA) 30 MG capsule Take 30 mg by mouth daily.   ELDERBERRY PO Take 1 capsule by mouth daily. (Patient not taking: Reported on 04/16/2022)   furosemide (LASIX) 20 MG tablet Take 20 mg by mouth.   furosemide (LASIX) 40 MG tablet Take 1 tablet (40 mg total) by mouth daily. (Patient not taking: Reported on 0/11/930)   GARLIC OIL PO Take 1 tablet by mouth daily.   losartan (COZAAR) 100 MG tablet Take 1 tablet (100 mg total) by mouth daily. (Patient not taking: Reported on 03/05/2022)   losartan-hydrochlorothiazide (HYZAAR) 100-25 MG tablet Take 1 tablet by mouth daily.   LUTEIN PO Take 1 tablet by mouth daily.   Omega-3 Fatty Acids (OMEGA-3 FISH OIL PO) Take 1 capsule by mouth daily.   OVER THE COUNTER MEDICATION Take 1 tablet by mouth daily. Super Beets   tizanidine (ZANAFLEX) 2 MG capsule Take 2 capsules (4 mg total) by mouth 3 (three) times daily as needed for muscle spasms. (Patient not taking: Reported on 04/16/2022)   TURMERIC PO Take 1 tablet by mouth daily. Chew (Patient not  taking: Reported on 04/16/2022)   No current facility-administered medications for this visit. (Other)      REVIEW OF SYSTEMS: ROS   Negative for: Constitutional, Gastrointestinal, Neurological, Skin, Genitourinary, Musculoskeletal, HENT, Endocrine, Cardiovascular, Eyes, Respiratory, Psychiatric, Allergic/Imm, Heme/Lymph Last edited by Hurman Horn, MD on 04/25/2022  4:33 PM.       ALLERGIES Allergies  Allergen Reactions   Corticosteroids Other (See Comments)    Increases glucose levels   Escitalopram Oxalate Diarrhea    Other reaction(s): diarrhea/poor sleep (08/2019)   Resource Thickenup Dairy [Compleat] Diarrhea    Dairy   Rosuvastatin Calcium     Other  reaction(s): nose bleeds   Tomato Other (See Comments)    Arthritis    PAST MEDICAL HISTORY Past Medical History:  Diagnosis Date   Anxiety    Arthritis    Atrial fibrillation ([place])    Depression    Dysrhythmia    Afib   Full dentures    Hypertension    Hypertension    Joint pain    Lactose intolerance    Osteoarthritis    Palpitations    Pre-diabetes    Prediabetes    Proliferative diabetic retinopathy of left eye determined by examination ([place]) 04/25/2022   Stenosis of right middle cerebral artery 2011   Wears glasses    Past Surgical History:  Procedure Laterality Date   CEREBRAL ANGIOGRAM     COLONOSCOPY     DIAGNOSTIC LAPAROSCOPY     fibroid   MASS EXCISION Right 04/27/2014   Procedure: EXCISION MASS RIGHT RING FINGER DEBRIDEMENT PROXIMAL INTERPHALANGEAL JOINT RIGHT RING FINGER (PIP);  Surgeon: Wynonia Sours, MD;  Location: [facility];  Service: Orthopedics;  Laterality: Right;   TOTAL KNEE ARTHROPLASTY Right 03/11/2022   Procedure: RIGHT TOTAL KNEE ARTHROPLASTY;  Surgeon: Frederik Pear, MD;  Location: [facility];  Service: Orthopedics;  Laterality: Right;    FAMILY HISTORY Family History  Problem Relation Age of Onset   Dementia Mother    Hypertension Mother    Cancer Father    Obesity Father    Obesity Sister     SOCIAL HISTORY Social History   Tobacco Use   Smoking status: Never   Smokeless tobacco: Never  Vaping Use   Vaping Use: Never used  Substance Use Topics   Alcohol use: Not Currently    Comment: rare   Drug use: No         OPHTHALMIC EXAM:  Base Eye Exam     Visual Acuity (ETDRS)       Right Left   Dist cc 20/125 -1 20/30 +2   Dist ph cc NI NI    Correction: Glasses         Tonometry (Tonopen, 3:50 PM)       Right Left   Pressure 16 18         Pupils       Dark APD   Right Dilated None   Left Dilated None  Patient Dilated at Dr. Talbert Forest office within the past 2 hours.        Visual Fields      Counting fingers         Extraocular Movement       Right Left    Full Full         Neuro/Psych     Oriented x3: Yes   Mood/Affect: Normal         Dilation     Both eyes: 1.0%  Mydriacyl, 2.5% Phenylephrine @ 3:50 PM           Slit Lamp and Fundus Exam     External Exam       Right Left   External Normal Normal         Slit Lamp Exam       Right Left   Lids/Lashes Normal Normal   Conjunctiva/Sclera White and quiet White and quiet   Cornea Clear Clear   Anterior Chamber Deep and quiet Deep and quiet   Iris Round and reactive Round and reactive   Lens 2+ Nuclear sclerosis 2+ Nuclear sclerosis   Anterior Vitreous Normal Normal         Fundus Exam       Right Left   Posterior Vitreous Pre-retinal hemorrhage, Vitreous hemorrhage Pre-retinal hemorrhage, Vitreous hemorrhage   C/D Ratio 0.55 0.55   Macula Normal Normal   Vessels Temporal periphery, white NVE fronds Temporal periphery, white NVE fronds also with regional nonperfusion.   Periphery Peripheral white neovascularization distal temporal periphery appears to be present through the haze OD. OS distal temporal neovascularization clearly seen in regions ofAvascularity with blood vessels growing and bleeding.also with           Refraction     Wearing Rx     Age: 75 yr            IMAGING AND PROCEDURES  Imaging and Procedures for 04/25/22  OCT, Retina - OU - Both Eyes       Right Eye Quality was poor.   Left Eye Central Foveal Thickness: 303.   Notes Media opacity central visual axis OD  OS no active maculopathy     Color Fundus Photography Optos - OU - Both Eyes       Right Eye Progression has no prior data. Disc findings include normal observations. Macula : normal observations. Periphery : neovascularization.   Left Eye Progression has no prior data.   Notes Central vitreous opacity, hemorrhage most likely from recent PVD triggering peripheral neovascularization  seen temporally to bleed.  Not likely a hole or tear.  OD will need vitrectomy if hemorrhage does not clear however at this time I would recommend cataract surgery in the right eye so as to begin the clearing process whereupon if I need to do another surgery to clear the hemorrhage the cataract is the way safely to get the long-term the best visual outcome     B-Scan Ultrasound - OD - Right Eye       Quality was good. Findings included subhyaloid opacities, vitreous opacities.   Notes No holes or tears.  No vitreal retinal traction.  No retinal detachment.  Significant hemorrhage.             ASSESSMENT/PLAN:  Vitreous hemorrhage, right eye ([place]) OD will need vitrectomy if hemorrhage does not clear however at this time I would recommend cataract surgery in the right eye so as to begin the clearing process whereupon if I need to do another surgery to clear the hemorrhage the cataract is the way safely to get the long-term the best visual outcome  Proliferative diabetic retinopathy of left eye determined by examination ([place]) Temporally, avascular retina, neovascular fronds in the watershed regions of the retina.  Most likely diabetic retinopathy even though there is a paucity remainder of diabetic retinal changes.  OS will need local peripheral PRP in this region to quiet the retinopathy and prevent neovascular disease progression temporally OS.  No holes  or tears are seen  Proliferative diabetic retinopathy of right eye determined by examination ([place]) OD will likely need ultimately vitrectomy to clear this central vitreous opacity which has been worsening per patient report over the last 7 to 8 months.  I will recommend cataract extraction with intraocular ends placement first under direction of Dr. Talbert Forest.  This may hasten spontaneous resolution of hemorrhage but more importantly allow for delivery of peripheral PRP so as to halt the etiology of the hemorrhage.  Moreover should  vitrectomy be undertaken a clear review would be in place to allow this to happen and have the best ultimate visual acuity outcome in this patient's care  Nuclear sclerotic cataract of both eyes The nature of cataract was discussed with the patient as well as the elective nature of surgery. The patient was reassured that surgery at a later date does not put the patient at risk for a worse outcome. It was emphasized that the need for surgery is dictated by the patient's quality of life as influenced by the cataract. Patient was instructed to maintain close follow up with their general eye care doctor.  Recommend cataract surgery as soon as possible within 5-6 weeks.  Right eye first.  This will allow media clearance should vitrectomy ultimately need to be performed but also to allow for visualization periphery to deliver peripheral PRP to NVE     ICD-10-CM   1. Vitreous hemorrhage, right eye ([place])  H43.11 OCT, Retina - OU - Both Eyes    Color Fundus Photography Optos - OU - Both Eyes    B-Scan Ultrasound - OD - Right Eye    2. Proliferative diabetic retinopathy of left eye determined by examination ([place])  U98.1191 OCT, Retina - OU - Both Eyes    Color Fundus Photography Optos - OU - Both Eyes    3. Proliferative diabetic retinopathy of right eye determined by examination ([place])  E11.3591 OCT, Retina - OU - Both Eyes    Color Fundus Photography Optos - OU - Both Eyes    B-Scan Ultrasound - OD - Right Eye    4. Nuclear sclerotic cataract of both eyes  H25.13 OCT, Retina - OU - Both Eyes      1.  OU patient appears to have some form of peripheral diabetic retinopathy even though she has "prediabetes" but this has been present for some 10 years.  Nonetheless peripheral retinal avascularity is led to neovascular fronds proven to be visible left eye but also faintly seen on the peripheral examination of right eye.  No evidence of retinal holes or tears on B-scan ultrasonography.  2.  Okay to  proceed in an immediate fashion for cataract extraction with intraocular lens placed on the right eye.  Okay to proceed as soon as this week  3. OD will need vitrectomy if hemorrhage does not clear however at this time I would recommend cataract surgery in the right eye so as to begin the clearing process whereupon if I need to do another surgery to clear the hemorrhage the cataract is the way safely to get the long-term the best visual outcome  RV next week fluorescein angiography will be performed, likely allow for peripheral PRP to be delivered to the right eye should sufficient clarity of the peripheral view be present.  OS will also need peripheral PRP prior to bleeding occurred  Ophthalmic Meds Ordered this visit:  No orders of the defined types were placed in this encounter.  Return for DILATE OU, OPTOS FFA L/R, COLOR FP, PRP, OS.  There are no Patient Instructions on file for this visit.   Explained the diagnoses, plan, and follow up with the patient and they expressed understanding.  Patient expressed understanding of the importance of proper follow up care.   Clent Demark Neeko Pharo M.D. Diseases & Surgery of the Retina and Vitreous Retina & Diabetic [place] 04/25/22     Abbreviations: M myopia (nearsighted); A astigmatism; H hyperopia (farsighted); P presbyopia; Mrx spectacle prescription;  CTL contact lenses; OD right eye; OS left eye; OU both eyes  XT exotropia; ET esotropia; PEK punctate epithelial keratitis; PEE punctate epithelial erosions; DES dry eye syndrome; MGD meibomian gland dysfunction; ATs artificial tears; PFAT's preservative free artificial tears; Crisman nuclear sclerotic cataract; PSC posterior subcapsular cataract; ERM epi-retinal membrane; PVD posterior vitreous detachment; RD retinal detachment; DM diabetes mellitus; DR diabetic retinopathy; NPDR non-proliferative diabetic retinopathy; PDR proliferative diabetic retinopathy; CSME clinically significant macular  edema; DME diabetic macular edema; dbh dot blot hemorrhages; CWS cotton wool spot; POAG primary open angle glaucoma; C/D cup-to-disc ratio; HVF humphrey visual field; GVF goldmann visual field; OCT optical coherence tomography; IOP intraocular pressure; BRVO Branch retinal vein occlusion; CRVO central retinal vein occlusion; CRAO central retinal artery occlusion; BRAO branch retinal artery occlusion; RT retinal tear; SB scleral buckle; PPV pars plana vitrectomy; VH Vitreous hemorrhage; PRP panretinal laser photocoagulation; IVK intravitreal kenalog; VMT vitreomacular traction; MH Macular hole;  NVD neovascularization of the disc; NVE neovascularization elsewhere; AREDS age related eye disease study; ARMD age related macular degeneration; POAG primary open angle glaucoma; EBMD epithelial/anterior basement membrane dystrophy; ACIOL anterior chamber intraocular lens; IOL intraocular lens; PCIOL posterior chamber intraocular lens; Phaco/IOL phacoemulsification with intraocular lens placement; Parkman photorefractive keratectomy; LASIK laser assisted in situ keratomileusis; HTN hypertension; DM diabetes mellitus; COPD chronic obstructive pulmonary disease

## 2022-04-25 NOTE — Assessment & Plan Note (Signed)
OD will need vitrectomy if hemorrhage does not clear however at this time I would recommend cataract surgery in the right eye so as to begin the clearing process whereupon if I need to do another surgery to clear the hemorrhage the cataract is the way safely to get the long-term the best visual outcome

## 2022-04-29 DIAGNOSIS — H25811 Combined forms of age-related cataract, right eye: Secondary | ICD-10-CM | POA: Diagnosis not present

## 2022-04-29 DIAGNOSIS — H2511 Age-related nuclear cataract, right eye: Secondary | ICD-10-CM | POA: Diagnosis not present

## 2022-04-30 ENCOUNTER — Encounter (INDEPENDENT_AMBULATORY_CARE_PROVIDER_SITE_OTHER): Payer: Self-pay

## 2022-04-30 ENCOUNTER — Encounter (INDEPENDENT_AMBULATORY_CARE_PROVIDER_SITE_OTHER): Payer: Medicare Other | Admitting: Ophthalmology

## 2022-04-30 NOTE — Care Plan (Signed)
Ortho Bundle Case Management Note  Patient Details  Name: Cynthia Frost MRN: [id number] Date of Birth: [birth year]  Spoke with patient in the office prior to surgery. She will discharge to home with family to assist. Has equipment at home. OPPT set up with [facility]. Patient and MD in agreement with plan. Choice offered                     DME Arranged:    DME Agency:     HH Arranged:    HH Agency:     Additional Comments: Please contact me with any questions of if this plan should need to change.  Ladell Heads,  [facility]  [phone] 04/30/2022, 5:45 PM

## 2022-05-02 ENCOUNTER — Encounter (INDEPENDENT_AMBULATORY_CARE_PROVIDER_SITE_OTHER): Payer: Self-pay | Admitting: Ophthalmology

## 2022-05-02 ENCOUNTER — Ambulatory Visit (INDEPENDENT_AMBULATORY_CARE_PROVIDER_SITE_OTHER): Payer: Medicare Other | Admitting: Ophthalmology

## 2022-05-02 DIAGNOSIS — H4311 Vitreous hemorrhage, right eye: Secondary | ICD-10-CM | POA: Diagnosis not present

## 2022-05-02 DIAGNOSIS — E113592 Type 2 diabetes mellitus with proliferative diabetic retinopathy without macular edema, left eye: Secondary | ICD-10-CM

## 2022-05-02 DIAGNOSIS — H2512 Age-related nuclear cataract, left eye: Secondary | ICD-10-CM | POA: Diagnosis not present

## 2022-05-02 DIAGNOSIS — Z961 Presence of intraocular lens: Secondary | ICD-10-CM | POA: Diagnosis not present

## 2022-05-02 DIAGNOSIS — E113591 Type 2 diabetes mellitus with proliferative diabetic retinopathy without macular edema, right eye: Secondary | ICD-10-CM

## 2022-05-02 NOTE — Assessment & Plan Note (Addendum)
OD improving allowing for vitreous hemorrhage clearance and to see the peripheral retina more clearly photographically as well as treatment wise next week

## 2022-05-02 NOTE — Assessment & Plan Note (Signed)
Vitreous hemorrhage slowly clearing OD post recent cataract surgery.  Needs peripheral PRP OD follow-up in 4 days

## 2022-05-06 ENCOUNTER — Ambulatory Visit (HOSPITAL_COMMUNITY): Admission: RE | Admit: 2022-05-06 | Payer: Medicare Other | Source: Home / Self Care | Admitting: Orthopedic Surgery

## 2022-05-06 ENCOUNTER — Ambulatory Visit (INDEPENDENT_AMBULATORY_CARE_PROVIDER_SITE_OTHER): Payer: Medicare Other | Admitting: Ophthalmology

## 2022-05-06 ENCOUNTER — Encounter (INDEPENDENT_AMBULATORY_CARE_PROVIDER_SITE_OTHER): Payer: Self-pay | Admitting: Ophthalmology

## 2022-05-06 ENCOUNTER — Encounter (HOSPITAL_COMMUNITY): Admission: RE | Payer: Self-pay | Source: Home / Self Care

## 2022-05-06 DIAGNOSIS — H2512 Age-related nuclear cataract, left eye: Secondary | ICD-10-CM

## 2022-05-06 DIAGNOSIS — E113592 Type 2 diabetes mellitus with proliferative diabetic retinopathy without macular edema, left eye: Secondary | ICD-10-CM

## 2022-05-06 DIAGNOSIS — E113591 Type 2 diabetes mellitus with proliferative diabetic retinopathy without macular edema, right eye: Secondary | ICD-10-CM

## 2022-05-06 DIAGNOSIS — Z01818 Encounter for other preprocedural examination: Secondary | ICD-10-CM

## 2022-05-06 DIAGNOSIS — H4311 Vitreous hemorrhage, right eye: Secondary | ICD-10-CM

## 2022-05-06 SURGERY — ARTHROPLASTY, KNEE, TOTAL
Anesthesia: Spinal | Site: Knee | Laterality: Left

## 2022-05-08 DIAGNOSIS — Z96651 Presence of right artificial knee joint: Secondary | ICD-10-CM | POA: Diagnosis not present

## 2022-05-08 DIAGNOSIS — M6281 Muscle weakness (generalized): Secondary | ICD-10-CM | POA: Diagnosis not present

## 2022-05-08 DIAGNOSIS — M25661 Stiffness of right knee, not elsewhere classified: Secondary | ICD-10-CM | POA: Diagnosis not present

## 2022-05-16 NOTE — Progress Notes (Signed)
Sent message, via epic in basket, requesting order in epic from surgeon     05/16/22 1309  Preop Orders  Has preop orders? No  Name of staff/physician contacted for orders(Indicate phone or IB message) Marlin Canary.

## 2022-05-20 NOTE — Care Plan (Signed)
Ortho Bundle Case Management Note  Patient Details  Name: Cynthia Frost MRN: [id number] Date of Birth: Mar 14, 1951   Spoke with patient in the office prior to surgery. She will discharge to home with family to assist. Has equipment at home. OPPT set up with [place]. Patient and MD in agreement with plan. Choice offered                                   DME Arranged:    DME Agency:     HH Arranged:    HH Agency:     Additional Comments: Please contact me with any questions of if this plan should need to change.    05/20/2022, 10:30 AM

## 2022-05-22 NOTE — Progress Notes (Signed)
COVID Vaccine received:  '[]'$  No '[x]'$  Yes  Date of any COVID positive Test in last 90 days: none  PCP - Gaynelle Arabian, MD Cardiologist - Kirk Ruths, MD    Clearance 02-13-22  Chest x-ray - 09-29-21 Epic EKG -  04-24-22  Epic Stress Test - 2017 ECHO - 01-06-21 Epic Cardiac Cath - n/a  Pacemaker/ICD device last checked: Date:       '[x]'$  N/A Spinal Cord Stimulator:'[x]'$  No '[]'$  Yes   Other Implants:   History of Sleep Apnea? '[x]'$  No '[]'$  Yes  '[]'$  unknown  Does the patient monitor blood sugar? '[]'$  No '[x]'$  Yes  '[]'$  N/A Fasting Blood Sugar Ranges- 99-124 Checks Blood Sugar __occasionally not regular  Blood Thinner Instructions: Takes Both Plavix and Eliquis Instructions:Stop Eliquis 3 days prior to surgery,  Last Dose: Eliquis stopped 05-23-2022   Patient is unsure when she should stop her PLAVIX but she has it written down at home on her calendar. She will contact Dr. Mayer Camel if she has any questions.   Activity level:  Can go up a flight of stairs and perform activities of daily living without stopping and without symptoms of chest pain or shortness of breath.'[]'$  No '[x]'$  Yes  '[]'$  N/A  Able to do exercises without symptoms '[]'$  No '[x]'$  Yes  '[]'$  N/A Patient able to complete ADLs without assistance '[]'$  No '[x]'$  Yes  '[]'$  N/A     Comments: Recent Eye Procedures by Dr. Deloria Lair This TKA surgery has been postponed several times.  There were no surgeon orders in Epic at the time of this PST. The patient will sign her consent DOS.   Anesthesia review: A.fib, HTN, Pre-DM (No Meds)  Patient denies shortness of breath, fever, cough and chest pain at PAT appointment Patient verbalized understanding and agreement to the Pre-Surgical Instructions that were given to them at this PAT appointment. Patient was also educated of the need to review these PAT instructions again prior to his/her surgery.I reviewed the appropriate phone numbers to call if they have any and questions or concerns.

## 2022-05-22 NOTE — Patient Instructions (Signed)
DUE TO SPACE LIMITATIONS, ONLY TWO VISITORS  (aged 73 and older) ARE ALLOWED TO COME WITH YOU AND STAY IN THE WAITING ROOM DURING YOUR PRE OP AND PROCEDURE.   **NO VISITORS ARE ALLOWED IN THE SHORT STAY AREA OR RECOVERY ROOM!!**  You are not required to quarantine at this time prior to your surgery. However, you must do this: Hand Hygiene often Do NOT share personal items Notify your provider if you are in close contact with someone who has COVID or you develop fever 100.4 or greater, new onset of sneezing, cough, sore throat, shortness of breath or body aches.       Your procedure is scheduled on:  Monday May 27, 2022  Report to [facility] Main Entrance.  Report to admitting at: 09:45  AM  +++++Call this number if you have any questions or problems the morning of surgery [phone]  Do not eat food :After Midnight the night prior to your surgery/procedure.  After Midnight you may have the following liquids until  09:15 AM DAY OF SURGERY  Clear Liquid Diet Water Black Coffee (sugar ok, NO MILK/CREAM OR CREAMERS)  Tea (sugar ok, NO MILK/CREAM OR CREAMERS) regular and decaf                             Plain Jell-O (NO RED)                                           Fruit ices (not with fruit pulp, NO RED)                                     Popsicles (NO RED)                                                                  Juice: apple, WHITE grape, WHITE cranberry Sports drinks like Gatorade (NO RED) Clear broth(vegetable,chicken,beef)                   The day of surgery:  Drink ONE (1) Pre-Surgery Clear Ensure or G2 at        AM the morning of surgery. Drink in one sitting. Do not sip.  This drink was given to you during your hospital  pre-op appointment visit. Nothing else to drink after completing the Pre-Surgery Clear Ensure or G2.   If you have questions, please contact your surgeon's office.   FOLLOW  ANY ADDITIONAL PRE OP INSTRUCTIONS YOU RECEIVED FROM  YOUR SURGEON'S OFFICE!!!   Oral Hygiene is also important to reduce your risk of infection.        Remember - BRUSH YOUR TEETH THE MORNING OF SURGERY WITH YOUR REGULAR TOOTHPASTE   Take ONLY these medicines the morning of surgery with A SIP OF WATER: Duloxetine (Cymbalta), Doxazosin (Cardura) and you may use your eye drops as prescribed.                  You may not have any metal on your body including hair pins,  jewelry, and body piercing  Do not wear make-up, lotions, powders, perfumes or deodorant  Do not wear nail polish including gel and S&S, artificial / acrylic nails, or any other type of covering on natural nails including finger and toenails. If you have artificial nails, gel coating, etc., that needs to be removed by a nail salon, Please have this removed prior to surgery. Not doing so may mean that your surgery could be cancelled or delayed if the Surgeon or anesthesia staff feels like they are unable to monitor you safely.   Do not shave 48 hours prior to surgery to avoid nicks in your skin which may contribute to postoperative infections.   Contacts, Hearing Aids, dentures or bridgework may not be worn into surgery.   DO NOT [place]. PHARMACY WILL DISPENSE MEDICATIONS LISTED ON YOUR MEDICATION LIST TO YOU DURING YOUR ADMISSION [place]!   Patients discharged on the day of surgery will not be allowed to drive home.  Someone NEEDS to stay with you for the first 24 hours after anesthesia.  Special Instructions: Bring a copy of your healthcare power of attorney and living will documents the day of surgery, if you wish to have them scanned into your [facility] Records- EPIC  Please read over the following fact sheets you were given: IF YOU HAVE QUESTIONS ABOUT YOUR PRE-OP INSTRUCTIONS, PLEASE CALL [phone]  ([place])   [place] - Preparing for Surgery Before surgery, you can play an important role.  Because skin is not  sterile, your skin needs to be as free of germs as possible.  You can reduce the number of germs on your skin by washing with CHG (chlorahexidine gluconate) soap before surgery.  CHG is an antiseptic cleaner which kills germs and bonds with the skin to continue killing germs even after washing. Please DO NOT use if you have an allergy to CHG or antibacterial soaps.  If your skin becomes reddened/irritated stop using the CHG and inform your nurse when you arrive at Short Stay. Do not shave (including legs and underarms) for at least 48 hours prior to the first CHG shower.  You may shave your face/neck.  Please follow these instructions carefully:  1.  Shower with CHG Soap the night before surgery and the  morning of surgery.  2.  If you choose to wash your hair, wash your hair first as usual with your normal  shampoo.  3.  After you shampoo, rinse your hair and body thoroughly to remove the shampoo.                             4.  Use CHG as you would any other liquid soap.  You can apply chg directly to the skin and wash.  Gently with a scrungie or clean washcloth.  5.  Apply the CHG Soap to your body ONLY FROM THE NECK DOWN.   Do not use on face/ open                           Wound or open sores. Avoid contact with eyes, ears mouth and genitals (private parts).                       Wash face,  Genitals (private parts) with your normal soap.  6.  Wash thoroughly, paying special attention to the area where your  surgery  will be performed.  7.  Thoroughly rinse your body with warm water from the neck down.  8.  DO NOT shower/wash with your normal soap after using and rinsing off the CHG Soap.            9.  Pat yourself dry with a clean towel.            10.  Wear clean pajamas.            11.  Place clean sheets on your bed the night of your first shower and do not  sleep with pets.  ON THE DAY OF SURGERY : Do not apply any lotions/deodorants the morning of surgery.  Please wear  clean clothes to the hospital/surgery center.    FAILURE TO FOLLOW THESE INSTRUCTIONS MAY RESULT IN THE CANCELLATION OF YOUR SURGERY  PATIENT SIGNATURE_________________________________  NURSE SIGNATURE__________________________________  ________________________________________________________________________

## 2022-05-23 ENCOUNTER — Other Ambulatory Visit: Payer: Self-pay

## 2022-05-23 ENCOUNTER — Encounter (HOSPITAL_COMMUNITY)
Admission: RE | Admit: 2022-05-23 | Discharge: 2022-05-23 | Disposition: A | Discharge status: Home / Self Care | Payer: Medicare Other | Source: Ambulatory Visit | Attending: Orthopedic Surgery | Admitting: Orthopedic Surgery

## 2022-05-23 ENCOUNTER — Encounter (HOSPITAL_COMMUNITY): Payer: Self-pay

## 2022-05-23 DIAGNOSIS — R7303 Prediabetes: Secondary | ICD-10-CM | POA: Insufficient documentation

## 2022-05-23 DIAGNOSIS — I4891 Unspecified atrial fibrillation: Secondary | ICD-10-CM | POA: Insufficient documentation

## 2022-05-23 DIAGNOSIS — Z01818 Encounter for other preprocedural examination: Secondary | ICD-10-CM

## 2022-05-23 DIAGNOSIS — M1712 Unilateral primary osteoarthritis, left knee: Secondary | ICD-10-CM | POA: Diagnosis not present

## 2022-05-23 DIAGNOSIS — I10 Essential (primary) hypertension: Secondary | ICD-10-CM | POA: Diagnosis not present

## 2022-05-23 DIAGNOSIS — Z01812 Encounter for preprocedural laboratory examination: Secondary | ICD-10-CM | POA: Insufficient documentation

## 2022-05-23 LAB — BASIC METABOLIC PANEL
Anion gap: 8 (ref 5–15)
BUN: 21 mg/dL (ref 8–23)
CO2: 28 mmol/L (ref 22–32)
Calcium: 9.4 mg/dL (ref 8.9–10.3)
Chloride: 100 mmol/L (ref 98–111)
Creatinine, Ser: 0.89 mg/dL (ref 0.44–1.00)
GFR, Estimated: >60 mL/min (ref >60)
Glucose, Bld: 126 mg/dL — ABNORMAL HIGH (ref 70–99)
Potassium: 3.1 mmol/L — ABNORMAL LOW (ref 3.5–5.1)
Sodium: 136 mmol/L (ref 135–145)

## 2022-05-23 LAB — CBC
HCT: 40.9 % (ref 36.0–46.0)
Hemoglobin: 13.3 g/dL (ref 12.0–15.0)
MCH: 29.6 pg (ref 26.0–34.0)
MCHC: 32.5 g/dL (ref 30.0–36.0)
MCV: 90.9 fL (ref 80.0–100.0)
Platelets: 215 10*3/uL (ref 150–400)
RBC: 4.5 MIL/uL (ref 3.87–5.11)
RDW: 14.3 % (ref 11.5–15.5)
WBC: 8.2 10*3/uL (ref 4.0–10.5)
nRBC: 0 % (ref 0.0–0.2)

## 2022-05-23 LAB — HEMOGLOBIN A1C
Hgb A1c MFr Bld: 6 % — ABNORMAL HIGH (ref 4.8–5.6)
Mean Plasma Glucose: 125.5 mg/dL

## 2022-05-23 LAB — SURGICAL PCR SCREEN
MRSA, PCR: NEGATIVE
Staphylococcus aureus: NEGATIVE

## 2022-05-23 LAB — GLUCOSE, CAPILLARY: Glucose-Capillary: 152 mg/dL — ABNORMAL HIGH (ref 70–99)

## 2022-05-23 NOTE — Progress Notes (Signed)
2nd request for patient surgery orders. Mrs. Cynthia Frost is scheduled for left TKA on Monday 05-27-22. Original request sent on 05-16-22 by Myrtie Soman. See below:  Sent message, via epic in basket, requesting order in epic from surgeon        05/16/22 1309  Preop Orders  Has preop orders? No  Name of staff/physician contacted for orders(Indicate phone or IB message) Marlin Canary.

## 2022-05-24 DIAGNOSIS — M1712 Unilateral primary osteoarthritis, left knee: Secondary | ICD-10-CM | POA: Diagnosis present

## 2022-05-24 NOTE — H&P (Signed)
TOTAL KNEE ADMISSION H&P  Patient is being admitted for left total knee arthroplasty.  Subjective:  Chief Complaint:left knee pain.  HPI: Cynthia Frost, 71 y.o. female, has a history of pain and functional disability in the left knee due to arthritis and has failed non-surgical conservative treatments for greater than 12 weeks to includeNSAID's and/or analgesics, corticosteriod injections, flexibility and strengthening excercises, supervised PT with diminished ADL's post treatment, use of assistive devices, weight reduction as appropriate, and activity modification.  Onset of symptoms was gradual, starting  several  years ago with gradually worsening course since that time. The patient noted no past surgery on the left knee(s).  Patient currently rates pain in the left knee(s) at 10 out of 10 with activity. Patient has night pain, worsening of pain with activity and weight bearing, pain that interferes with activities of daily living, pain with passive range of motion, crepitus, and joint swelling.  Patient has evidence of periarticular osteophytes and joint space narrowing by imaging studies.  There is no active infection.  Patient Active Problem List   Diagnosis Date Noted   Degenerative arthritis of left knee 05/24/2022   Pseudophakia of right eye 05/02/2022   Vitreous hemorrhage, right eye ([place]) 04/25/2022   Proliferative diabetic retinopathy of left eye determined by examination ([place]) 04/25/2022   Proliferative diabetic retinopathy of right eye determined by examination ([place]) 04/25/2022   Nuclear sclerotic cataract of left eye 04/25/2022   Arthritis of right knee 03/11/2022   Osteoarthritis of right knee 03/06/2022   Acute upper respiratory infection 11/01/2020   Diabetes mellitus ([place]) 05/24/2020   Essential hypertension 05/24/2020   Class 3 severe obesity with serious comorbidity and body mass index (BMI) of 40.0 to 44.9 in adult [facility]) 05/24/2020   Plantar fasciitis 06/16/2018    Stenosis of right middle cerebral artery    Past Medical History:  Diagnosis Date   Anxiety    Arthritis    Atrial fibrillation (HCC)    Depression    Dysrhythmia    Afib   Full dentures    Hypertension    Hypertension    Joint pain    Lactose intolerance    Osteoarthritis    Palpitations    Pre-diabetes    Prediabetes    Proliferative diabetic retinopathy of left eye determined by examination ([place]) 04/25/2022   Stenosis of right middle cerebral artery 2011   Wears glasses     Past Surgical History:  Procedure Laterality Date   CEREBRAL ANGIOGRAM     COLONOSCOPY     DIAGNOSTIC LAPAROSCOPY     fibroid   MASS EXCISION Right 04/27/2014   Procedure: EXCISION MASS RIGHT RING FINGER DEBRIDEMENT PROXIMAL INTERPHALANGEAL JOINT RIGHT RING FINGER (PIP);  Surgeon: Wynonia Sours, MD;  Location: [facility];  Service: Orthopedics;  Laterality: Right;   TOTAL KNEE ARTHROPLASTY Right 03/11/2022   Procedure: RIGHT TOTAL KNEE ARTHROPLASTY;  Surgeon: Frederik Pear, MD;  Location: [facility];  Service: Orthopedics;  Laterality: Right;    No current facility-administered medications for this encounter.   Current Outpatient Medications  Medication Sig Dispense Refill Last Dose   amLODipine (NORVASC) 10 MG tablet TAKE 1 TABLET DAILY 90 tablet 3    apixaban (ELIQUIS) 5 MG TABS tablet Take 5 mg by mouth 2 (two) times daily.      atorvastatin (LIPITOR) 10 MG tablet Take 10 mg by mouth 3 (three) times a week.      carvedilol (COREG) 25 MG tablet Take 0.5 tablets (12.5  mg total) by mouth daily. (Patient taking differently: Take 25 mg by mouth 2 (two) times daily with a meal.) 30 tablet 2    clopidogrel (PLAVIX) 75 MG tablet Take 75 mg by mouth daily.      Difluprednate 0.05 % EMUL Place 1 drop into the right eye See admin instructions. Instill 1 drop into right eye twice daily until 7/12 then instill 1 drop daily in the right eye for 7 days      doxazosin (CARDURA) 8 MG tablet Take 8 mg by mouth  daily.       DULoxetine (CYMBALTA) 30 MG capsule Take 30 mg by mouth daily.      furosemide (LASIX) 20 MG tablet Take 20 mg by mouth daily.      ketorolac (ACULAR) 0.5 % ophthalmic solution Place 1 drop into the right eye 4 (four) times daily.      losartan-hydrochlorothiazide (HYZAAR) 100-25 MG tablet Take 1 tablet by mouth daily.      Menthol-Methyl Salicylate (SALONPAS PAIN RELIEF PATCH EX) Apply 1 patch topically daily as needed (pain).      Boswellia-Glucosamine-Vit D (OSTEO BI-FLEX ONE PER DAY PO) Take 2 tablets by mouth daily.      Cholecalciferol (VITAMIN D3) 75 MCG (3000 UT) TABS Take 1 tablet by mouth daily. (Patient taking differently: Take 1 tablet by mouth every Sunday.) 30 tablet 0    Cyanocobalamin (B-12 PO) Place 1 Dose under the tongue at bedtime.      Multiple Vitamin (MULTIVITAMIN WITH MINERALS) TABS tablet Take 1 tablet by mouth daily.      Omega-3 Fatty Acids (OMEGA-3 FISH OIL PO) Take 1 capsule by mouth daily.      OVER THE COUNTER MEDICATION Take 2 capsules by mouth daily. Super Beets      TURMERIC PO Take 2 capsules by mouth daily. Chew      Allergies  Allergen Reactions   Corticosteroids Other (See Comments)    Increases glucose levels   Escitalopram Oxalate Diarrhea    diarrhea/poor sleep (08/2019)   Lactose Intolerance (Gi) Diarrhea   Rosuvastatin Calcium     Other reaction(s): nose bleeds   Tomato Other (See Comments)    Arthritis    Social History   Tobacco Use   Smoking status: Never   Smokeless tobacco: Never  Substance Use Topics   Alcohol use: Not Currently    Comment: rare    Family History  Problem Relation Age of Onset   Dementia Mother    Hypertension Mother    Cancer Father    Obesity Father    Obesity Sister      Review of Systems  Constitutional: Negative.   HENT: Negative.    Respiratory: Negative.    Cardiovascular:        Htn  Gastrointestinal: Negative.   Endocrine: Negative.   Genitourinary: Negative.   Musculoskeletal:   Positive for arthralgias and joint swelling.  Allergic/Immunologic: Negative.   Neurological: Negative.   Hematological: Negative.   Psychiatric/Behavioral:  The patient is nervous/anxious.     Objective:  Physical Exam Constitutional:      Appearance: Normal appearance. She is obese.  HENT:     Head: Normocephalic and atraumatic.     Nose: Nose normal.  Eyes:     Pupils: Pupils are equal, round, and reactive to light.  Cardiovascular:     Pulses: Normal pulses.  Pulmonary:     Effort: Pulmonary effort is normal.  Musculoskeletal:     Cervical back:  Normal range of motion and neck supple.     Comments: Patient's left knee has a range from 5 to 110.  No instability.    Skin:    General: Skin is warm and dry.  Neurological:     General: No focal deficit present.     Mental Status: She is alert and oriented to person, place, and time. Mental status is at baseline.  Psychiatric:        Mood and Affect: Mood normal.        Behavior: Behavior normal.        Thought Content: Thought content normal.        Judgment: Judgment normal.     Vital signs in last 24 hours:    Labs:   Estimated body mass index is 39.94 kg/m as calculated from the following:   Height as of 04/24/22: '5\' 5"'$  (1.651 m).   Weight as of 03/11/22: 108.9 kg.   Imaging Review Plain radiographs demonstrate bilateral AP weightbearing, lateral, and sunrise views of the right knee are taken and reviewed in office today.  This shows a well-placed well fixed right total knee arthroplasty.  Patient's left knee has end-stage bone-on-bone arthritis medial compartment with periarticular osteophyte formation.      Assessment/Plan:  End stage arthritis, left knee   The patient history, physical examination, clinical judgment of the provider and imaging studies are consistent with end stage degenerative joint disease of the left knee(s) and total knee arthroplasty is deemed medically necessary. The treatment  options including medical management, injection therapy arthroscopy and arthroplasty were discussed at length. The risks and benefits of total knee arthroplasty were presented and reviewed. The risks due to aseptic loosening, infection, stiffness, patella tracking problems, thromboembolic complications and other imponderables were discussed. The patient acknowledged the explanation, agreed to proceed with the plan and consent was signed. Patient is being admitted for inpatient treatment for surgery, pain control, PT, OT, prophylactic antibiotics, VTE prophylaxis, progressive ambulation and ADL's and discharge planning. The patient is planning to be discharged home with home health services     Patient's anticipated LOS is less than 2 midnights, meeting these requirements: - Younger than 15 - Lives within 1 hour of care - Has a competent adult at home to recover with post-op recover - NO history of  - Chronic pain requiring opiods  - Diabetes  - Coronary Artery Disease  - Heart failure  - Heart attack  - Stroke  - DVT/VTE  - Cardiac arrhythmia  - Respiratory Failure/COPD  - Renal failure  - Anemia  - Advanced Liver disease

## 2022-05-24 NOTE — Anesthesia Preprocedure Evaluation (Signed)
Anesthesia Evaluation  Patient identified by MRN, date of birth, ID band Patient awake    Reviewed: Allergy & Precautions, NPO status , Patient's Chart, lab work & pertinent test results  Airway Mallampati: II  TM Distance: >3 FB Neck ROM: Full    Dental  (+) Dental Advisory Given   Pulmonary neg pulmonary ROS,    breath sounds clear to auscultation       Cardiovascular hypertension, Pt. on medications and Pt. on home beta blockers + dysrhythmias Atrial Fibrillation  Rhythm:Regular Rate:Normal     Neuro/Psych negative neurological ROS     GI/Hepatic negative GI ROS, Neg liver ROS,   Endo/Other  diabetes, Type 2  Renal/GU negative Renal ROS     Musculoskeletal  (+) Arthritis ,   Abdominal   Peds  Hematology  (+) Blood dyscrasia, ,   Anesthesia Other Findings   Reproductive/Obstetrics                            Lab Results  Component Value Date   WBC 8.2 05/23/2022   HGB 13.3 05/23/2022   HCT 40.9 05/23/2022   MCV 90.9 05/23/2022   PLT 215 05/23/2022   Lab Results  Component Value Date   CREATININE 0.89 05/23/2022   BUN 21 05/23/2022   NA 136 05/23/2022   K 3.1 (L) 05/23/2022   CL 100 05/23/2022   CO2 28 05/23/2022    Anesthesia Physical Anesthesia Plan  ASA: 3  Anesthesia Plan: General   Post-op Pain Management: Regional block*, Tylenol PO (pre-op)* and Toradol IV (intra-op)*   Induction: Intravenous  PONV Risk Score and Plan: 3 and Dexamethasone, Ondansetron and Treatment may vary due to age or medical condition  Airway Management Planned: Oral ETT and LMA  Additional Equipment:   Intra-op Plan:   Post-operative Plan: Extubation in OR  Informed Consent: I have reviewed the patients History and Physical, chart, labs and discussed the procedure including the risks, benefits and alternatives for the proposed anesthesia with the patient or authorized representative  who has indicated his/her understanding and acceptance.     Dental advisory given  Plan Discussed with: CRNA  Anesthesia Plan Comments: (See PAT note 05/23/22; pt reports last dose of Eliquis 05/23/22, last dose of Plavix 05/23/22 at 10pm. )       Anesthesia Quick Evaluation

## 2022-05-24 NOTE — Progress Notes (Signed)
Anesthesia Chart Review   Case: [id number] Date/Time: 05/27/22 1207   Procedure: LEFT TOTAL KNEE ARTHROPLASTY (Left: Knee)   Anesthesia type: Spinal   Pre-op diagnosis: LEFT KNEE OSTEOARTHRITIS   Location: [facility] ROOM 07 / [facility]   Surgeons: Cynthia Pear, MD       DISCUSSION:71 y.o. never smoker with h/o HTN, atrial fibrillation, cerebral artery stenosis, left knee OA scheduled for above procedure 05/27/2022 with Dr. Frederik Frost.   Pt s/p right total knee 03/11/2022.  Cleared by cardiology prior to this procedure.   Per cardiology preoperative evaluation 02/13/2022, "Chart reviewed as part of pre-operative protocol coverage. Cynthia Frost, [place] spoke with Cynthia Frost on 02/12/2022. She denied any recent chest pain or worsening dyspnea since the last office visit.  Overall she was doing well. Unfortunately because of significant knee pain, she is unable to achieve more than 4 METS of activity. Per review by Dr. Stanford Frost, primary cardiologist, Cynthia Frost would be at acceptable risk for the planned procedure without further cardiovascular testing. "  Pt reports she was advised to hold Eliquis 3 days prior to procedure, last dose 05/23/22.   Pt reported at PAT visit she had no instructions for holding Plavix.  She was advised to contact prescriber to get instructions.  Followed up with pt 7/14, pt reports last dose of Plavix 05/23/22 at 10pm.  Discussed with Dr. Damita Dunnings office.  VS: There were no vitals taken for this visit.  PROVIDERS: Cynthia Arabian, MD is PCP   Primary Cardiologist: Cynthia Ruths, MD LABS: Labs reviewed: Acceptable for surgery. (all labs ordered are listed, but only abnormal results are displayed)  Labs Reviewed  HEMOGLOBIN A1C - Abnormal; Notable for the following components:      Result Value   Hgb A1c MFr Bld 6.0 (*)    All other components within normal limits  BASIC METABOLIC PANEL - Abnormal; Notable for the following components:   Potassium 3.1 (*)    Glucose, Bld 126 (*)     All other components within normal limits  GLUCOSE, CAPILLARY - Abnormal; Notable for the following components:   Glucose-Capillary 152 (*)    All other components within normal limits  SURGICAL PCR SCREEN  CBC     IMAGES:   EKG: 04/24/2022 Rate 55 bpm  Atrial fibrillation with slow ventricular response Left axis deviation Low voltage QRS Septal infarct , age undetermined T wave abnormality, consider inferolateral ischemia Abnormal ECG  CV: Echo 01/05/2021 1. Left ventricular ejection fraction, by estimation, is 60 to 65%. The  left ventricle has normal function. The left ventricle has no regional  wall motion abnormalities. There is mild left ventricular hypertrophy.  Left ventricular diastolic parameters  are indeterminate.   2. Right ventricular systolic function is mildly reduced. The right  ventricular size is normal. There is moderately elevated pulmonary artery  systolic pressure. The estimated right ventricular systolic pressure is  62.2 mmHg.   3. Left atrial size was moderately dilated.   4. The mitral valve is normal in structure. Mild mitral valve  regurgitation. No evidence of mitral stenosis.   5. The aortic valve is normal in structure. Aortic valve regurgitation is  not visualized. No aortic stenosis is present.   6. The inferior vena cava is dilated in size with >50% respiratory  variability, suggesting right atrial pressure of 8 mmHg.   Myocardial Perfusion 10/23/2016   The left ventricular ejection fraction is mildly decreased (45-54%). Nuclear stress EF: 51%. There was no ST segment deviation noted  during stress. The study is normal. This is a low risk study.   Normal resting and stress perfusion. No ischemia or infarction EF EF 51% but calculated with patient in afib Past Medical History:  Diagnosis Date   Anxiety    Arthritis    Atrial fibrillation (HCC)    Depression    Dysrhythmia    Afib   Full dentures    Hypertension     Hypertension    Joint pain    Lactose intolerance    Osteoarthritis    Palpitations    Pre-diabetes    Prediabetes    Proliferative diabetic retinopathy of left eye determined by examination ([place]) 04/25/2022   Stenosis of right middle cerebral artery 2011   Wears glasses     Past Surgical History:  Procedure Laterality Date   CEREBRAL ANGIOGRAM     COLONOSCOPY     DIAGNOSTIC LAPAROSCOPY     fibroid   MASS EXCISION Right 04/27/2014   Procedure: EXCISION MASS RIGHT RING FINGER DEBRIDEMENT PROXIMAL INTERPHALANGEAL JOINT RIGHT RING FINGER (PIP);  Surgeon: Wynonia Sours, MD;  Location: [facility];  Service: Orthopedics;  Laterality: Right;   TOTAL KNEE ARTHROPLASTY Right 03/11/2022   Procedure: RIGHT TOTAL KNEE ARTHROPLASTY;  Surgeon: Cynthia Pear, MD;  Location[facility];  Service: Orthopedics;  Laterality: Right;    MEDICATIONS:  amLODipine (NORVASC) 10 MG tablet   apixaban (ELIQUIS) 5 MG TABS tablet   atorvastatin (LIPITOR) 10 MG tablet   Boswellia-Glucosamine-Vit D (OSTEO BI-FLEX ONE PER DAY PO)   carvedilol (COREG) 25 MG tablet   Cholecalciferol (VITAMIN D3) 75 MCG (3000 UT) TABS   clopidogrel (PLAVIX) 75 MG tablet   Cyanocobalamin (B-12 PO)   Difluprednate 0.05 % EMUL   doxazosin (CARDURA) 8 MG tablet   DULoxetine (CYMBALTA) 30 MG capsule   furosemide (LASIX) 20 MG tablet   ketorolac (ACULAR) 0.5 % ophthalmic solution   losartan-hydrochlorothiazide (HYZAAR) 100-25 MG tablet   Menthol-Methyl Salicylate (SALONPAS PAIN RELIEF PATCH EX)   Multiple Vitamin (MULTIVITAMIN WITH MINERALS) TABS tablet   Omega-3 Fatty Acids (OMEGA-3 FISH OIL PO)   OVER THE COUNTER MEDICATION   TURMERIC PO   No current facility-administered medications for this encounter.    Cynthia Felix Ward, PA-C [facility] [phone]

## 2022-05-27 ENCOUNTER — Ambulatory Visit (HOSPITAL_COMMUNITY)
Admission: RE | Admit: 2022-05-27 | Discharge: 2022-05-28 | Disposition: A | Discharge status: Home / Self Care | Payer: Medicare Other | Attending: Orthopedic Surgery | Admitting: Orthopedic Surgery

## 2022-05-27 ENCOUNTER — Ambulatory Visit (HOSPITAL_COMMUNITY): Payer: Medicare Other | Admitting: Physician Assistant

## 2022-05-27 ENCOUNTER — Encounter (HOSPITAL_COMMUNITY)
Admission: RE | Disposition: A | Discharge status: Home / Self Care | Payer: Self-pay | Source: Home / Self Care | Attending: Orthopedic Surgery

## 2022-05-27 ENCOUNTER — Ambulatory Visit (HOSPITAL_BASED_OUTPATIENT_CLINIC_OR_DEPARTMENT_OTHER): Payer: Medicare Other | Admitting: Anesthesiology

## 2022-05-27 ENCOUNTER — Other Ambulatory Visit: Payer: Self-pay

## 2022-05-27 ENCOUNTER — Encounter (HOSPITAL_COMMUNITY): Payer: Self-pay | Admitting: Orthopedic Surgery

## 2022-05-27 DIAGNOSIS — G8918 Other acute postprocedural pain: Secondary | ICD-10-CM | POA: Diagnosis not present

## 2022-05-27 DIAGNOSIS — Z96652 Presence of left artificial knee joint: Secondary | ICD-10-CM

## 2022-05-27 DIAGNOSIS — D62 Acute posthemorrhagic anemia: Secondary | ICD-10-CM | POA: Diagnosis not present

## 2022-05-27 DIAGNOSIS — Z79899 Other long term (current) drug therapy: Secondary | ICD-10-CM | POA: Insufficient documentation

## 2022-05-27 DIAGNOSIS — E1136 Type 2 diabetes mellitus with diabetic cataract: Secondary | ICD-10-CM | POA: Insufficient documentation

## 2022-05-27 DIAGNOSIS — Z6839 Body mass index (BMI) 39.0-39.9, adult: Secondary | ICD-10-CM | POA: Insufficient documentation

## 2022-05-27 DIAGNOSIS — E119 Type 2 diabetes mellitus without complications: Secondary | ICD-10-CM

## 2022-05-27 DIAGNOSIS — I10 Essential (primary) hypertension: Secondary | ICD-10-CM | POA: Insufficient documentation

## 2022-05-27 DIAGNOSIS — I4891 Unspecified atrial fibrillation: Secondary | ICD-10-CM | POA: Insufficient documentation

## 2022-05-27 DIAGNOSIS — E113593 Type 2 diabetes mellitus with proliferative diabetic retinopathy without macular edema, bilateral: Secondary | ICD-10-CM | POA: Insufficient documentation

## 2022-05-27 DIAGNOSIS — R7303 Prediabetes: Secondary | ICD-10-CM

## 2022-05-27 DIAGNOSIS — M1712 Unilateral primary osteoarthritis, left knee: Secondary | ICD-10-CM | POA: Diagnosis not present

## 2022-05-27 HISTORY — PX: TOTAL KNEE ARTHROPLASTY: SHX125

## 2022-05-27 LAB — GLUCOSE, CAPILLARY: Glucose-Capillary: 131 mg/dL — ABNORMAL HIGH (ref 70–99)

## 2022-05-27 SURGERY — ARTHROPLASTY, KNEE, TOTAL
Anesthesia: General | Site: Knee | Laterality: Left

## 2022-05-27 MED ORDER — MEPERIDINE HCL 50 MG/ML IJ SOLN: 6.2500 mg | INTRAMUSCULAR | Status: DC | PRN

## 2022-05-27 MED ORDER — APIXABAN 2.5 MG PO TABS
2.5000 mg | ORAL_TABLET | Freq: Two times a day (BID) | ORAL | Status: DC
  Administered 2022-05-28: 2.5 mg via ORAL
  Filled 2022-05-27: qty 1

## 2022-05-27 MED ORDER — ACETAMINOPHEN 160 MG/5ML PO SOLN: 325.0000 mg | ORAL | Status: DC | PRN

## 2022-05-27 MED ORDER — FENTANYL CITRATE PF 50 MCG/ML IJ SOSY: 50.0000 ug | PREFILLED_SYRINGE | Freq: Once | INTRAMUSCULAR | Status: AC

## 2022-05-27 MED ORDER — OXYCODONE HCL 5 MG/5ML PO SOLN: 5.0000 mg | Freq: Once | ORAL | Status: DC | PRN

## 2022-05-27 MED ORDER — DOXAZOSIN MESYLATE 8 MG PO TABS
8.0000 mg | ORAL_TABLET | Freq: Every day | ORAL | Status: DC
  Administered 2022-05-28: 8 mg via ORAL
  Filled 2022-05-27: qty 1

## 2022-05-27 MED ORDER — FENTANYL CITRATE PF 50 MCG/ML IJ SOSY: 25.0000 ug | PREFILLED_SYRINGE | INTRAMUSCULAR | Status: DC | PRN

## 2022-05-27 MED ORDER — OXYCODONE HCL 5 MG PO TABS: 10.0000 mg | ORAL_TABLET | ORAL | Status: DC | PRN

## 2022-05-27 MED ORDER — MENTHOL 3 MG MT LOZG: 1.0000 | LOZENGE | OROMUCOSAL | Status: DC | PRN

## 2022-05-27 MED ORDER — KCL IN DEXTROSE-NACL 20-5-0.45 MEQ/L-%-% IV SOLN
INTRAVENOUS | Status: DC
  Filled 2022-05-27 (×3): qty 1000

## 2022-05-27 MED ORDER — DIFLUPREDNATE 0.05 % OP EMUL
1.0000 [drp] | Freq: Every day | OPHTHALMIC | Status: DC
  Administered 2022-05-28: 1 [drp] via OPHTHALMIC
  Filled 2022-05-27: qty 0.1

## 2022-05-27 MED ORDER — ONDANSETRON HCL 4 MG/2ML IJ SOLN
INTRAMUSCULAR | Status: DC | PRN
  Administered 2022-05-27: 4 mg via INTRAVENOUS

## 2022-05-27 MED ORDER — AMLODIPINE BESYLATE 10 MG PO TABS
10.0000 mg | ORAL_TABLET | Freq: Every day | ORAL | Status: DC
  Administered 2022-05-27: 10 mg via ORAL
  Filled 2022-05-27 (×2): qty 1

## 2022-05-27 MED ORDER — TRANEXAMIC ACID-NACL 1000-0.7 MG/100ML-% IV SOLN
1000.0000 mg | Freq: Once | INTRAVENOUS | Status: AC
  Administered 2022-05-27: 1000 mg via INTRAVENOUS
  Filled 2022-05-27: qty 100

## 2022-05-27 MED ORDER — PHENOL 1.4 % MT LIQD: 1.0000 | OROMUCOSAL | Status: DC | PRN

## 2022-05-27 MED ORDER — ONDANSETRON HCL 4 MG/2ML IJ SOLN
INTRAMUSCULAR | Status: AC
  Filled 2022-05-27: qty 2

## 2022-05-27 MED ORDER — PROPOFOL 10 MG/ML IV BOLUS
INTRAVENOUS | Status: DC | PRN
  Administered 2022-05-27 (×2): 20 mg via INTRAVENOUS

## 2022-05-27 MED ORDER — DEXAMETHASONE SODIUM PHOSPHATE 10 MG/ML IJ SOLN
INTRAMUSCULAR | Status: DC | PRN
  Administered 2022-05-27: 10 mg

## 2022-05-27 MED ORDER — DEXAMETHASONE SODIUM PHOSPHATE 10 MG/ML IJ SOLN
INTRAMUSCULAR | Status: AC
  Filled 2022-05-27: qty 1

## 2022-05-27 MED ORDER — OXYCODONE HCL 5 MG PO TABS: 5.0000 mg | ORAL_TABLET | Freq: Once | ORAL | Status: DC | PRN

## 2022-05-27 MED ORDER — PHENYLEPHRINE HCL-NACL 20-0.9 MG/250ML-% IV SOLN
INTRAVENOUS | Status: DC | PRN
  Administered 2022-05-27: 50 ug/min via INTRAVENOUS

## 2022-05-27 MED ORDER — ONDANSETRON HCL 4 MG PO TABS: 4.0000 mg | ORAL_TABLET | Freq: Four times a day (QID) | ORAL | Status: DC | PRN

## 2022-05-27 MED ORDER — CHLORHEXIDINE GLUCONATE 0.12 % MT SOLN
15.0000 mL | Freq: Once | OROMUCOSAL | Status: AC
  Administered 2022-05-27: 15 mL via OROMUCOSAL

## 2022-05-27 MED ORDER — MIDAZOLAM HCL 2 MG/2ML IJ SOLN
INTRAMUSCULAR | Status: AC
  Filled 2022-05-27: qty 2

## 2022-05-27 MED ORDER — FENTANYL CITRATE PF 50 MCG/ML IJ SOSY
PREFILLED_SYRINGE | INTRAMUSCULAR | Status: AC
  Administered 2022-05-27: 50 ug via INTRAVENOUS
  Filled 2022-05-27: qty 2

## 2022-05-27 MED ORDER — ROPIVACAINE HCL 5 MG/ML IJ SOLN
INTRAMUSCULAR | Status: DC | PRN
  Administered 2022-05-27: 25 mL via PERINEURAL

## 2022-05-27 MED ORDER — BUPIVACAINE LIPOSOME 1.3 % IJ SUSP
INTRAMUSCULAR | Status: AC
  Filled 2022-05-27: qty 20

## 2022-05-27 MED ORDER — TRANEXAMIC ACID-NACL 1000-0.7 MG/100ML-% IV SOLN
INTRAVENOUS | Status: AC
  Filled 2022-05-27: qty 100

## 2022-05-27 MED ORDER — SODIUM CHLORIDE 0.9 % IV SOLN
INTRAVENOUS | Status: DC | PRN
  Administered 2022-05-27: 120 mL

## 2022-05-27 MED ORDER — ALUM & MAG HYDROXIDE-SIMETH 200-200-20 MG/5ML PO SUSP: 30.0000 mL | ORAL | Status: DC | PRN

## 2022-05-27 MED ORDER — WATER FOR IRRIGATION, STERILE IR SOLN
Status: DC | PRN
  Administered 2022-05-27: 2000 mL

## 2022-05-27 MED ORDER — TRANEXAMIC ACID-NACL 1000-0.7 MG/100ML-% IV SOLN
1000.0000 mg | INTRAVENOUS | Status: AC
  Administered 2022-05-27: 1000 mg via INTRAVENOUS

## 2022-05-27 MED ORDER — ACETAMINOPHEN 325 MG PO TABS: 325.0000 mg | ORAL_TABLET | Freq: Four times a day (QID) | ORAL | Status: DC | PRN

## 2022-05-27 MED ORDER — KETOROLAC TROMETHAMINE 0.5 % OP SOLN
1.0000 [drp] | Freq: Four times a day (QID) | OPHTHALMIC | Status: DC
  Administered 2022-05-27 – 2022-05-28 (×3): 1 [drp] via OPHTHALMIC
  Filled 2022-05-27: qty 5

## 2022-05-27 MED ORDER — ONDANSETRON HCL 4 MG/2ML IJ SOLN: 4.0000 mg | Freq: Once | INTRAMUSCULAR | Status: DC | PRN

## 2022-05-27 MED ORDER — ACETAMINOPHEN 325 MG PO TABS: 325.0000 mg | ORAL_TABLET | ORAL | Status: DC | PRN

## 2022-05-27 MED ORDER — SODIUM CHLORIDE (PF) 0.9 % IJ SOLN
INTRAMUSCULAR | Status: AC
  Filled 2022-05-27: qty 50

## 2022-05-27 MED ORDER — METOCLOPRAMIDE HCL 5 MG PO TABS: 5.0000 mg | ORAL_TABLET | Freq: Three times a day (TID) | ORAL | Status: DC | PRN

## 2022-05-27 MED ORDER — HYDROMORPHONE HCL 1 MG/ML IJ SOLN
0.5000 mg | INTRAMUSCULAR | Status: DC | PRN
  Administered 2022-05-27: 1 mg via INTRAVENOUS
  Filled 2022-05-27: qty 1

## 2022-05-27 MED ORDER — DOCUSATE SODIUM 100 MG PO CAPS
100.0000 mg | ORAL_CAPSULE | Freq: Two times a day (BID) | ORAL | Status: DC
  Administered 2022-05-27 – 2022-05-28 (×2): 100 mg via ORAL
  Filled 2022-05-27 (×2): qty 1

## 2022-05-27 MED ORDER — DEXAMETHASONE SODIUM PHOSPHATE 10 MG/ML IJ SOLN
INTRAMUSCULAR | Status: AC
Start: 2022-05-27 — End: ?
  Filled 2022-05-27: qty 1

## 2022-05-27 MED ORDER — FENTANYL CITRATE (PF) 250 MCG/5ML IJ SOLN
INTRAMUSCULAR | Status: AC
  Filled 2022-05-27: qty 5

## 2022-05-27 MED ORDER — LACTATED RINGERS IV SOLN: INTRAVENOUS | Status: DC

## 2022-05-27 MED ORDER — CLOPIDOGREL BISULFATE 75 MG PO TABS
75.0000 mg | ORAL_TABLET | Freq: Every day | ORAL | Status: DC
  Administered 2022-05-28: 75 mg via ORAL
  Filled 2022-05-27: qty 1

## 2022-05-27 MED ORDER — SODIUM CHLORIDE 0.9 % IR SOLN
Status: DC | PRN
  Administered 2022-05-27: 1000 mL

## 2022-05-27 MED ORDER — ACETAMINOPHEN 500 MG PO TABS
1000.0000 mg | ORAL_TABLET | Freq: Four times a day (QID) | ORAL | Status: DC
  Administered 2022-05-27 – 2022-05-28 (×3): 1000 mg via ORAL
  Filled 2022-05-27 (×3): qty 2

## 2022-05-27 MED ORDER — CARVEDILOL 12.5 MG PO TABS
25.0000 mg | ORAL_TABLET | Freq: Once | ORAL | Status: AC
Start: 2022-05-27 — End: 2022-05-27
  Administered 2022-05-27: 25 mg via ORAL
  Filled 2022-05-27: qty 2

## 2022-05-27 MED ORDER — SODIUM CHLORIDE (PF) 0.9 % IJ SOLN
INTRAMUSCULAR | Status: AC
  Filled 2022-05-27: qty 20

## 2022-05-27 MED ORDER — BUPIVACAINE-EPINEPHRINE (PF) 0.25% -1:200000 IJ SOLN
INTRAMUSCULAR | Status: AC
  Filled 2022-05-27: qty 30

## 2022-05-27 MED ORDER — LOSARTAN POTASSIUM-HCTZ 100-25 MG PO TABS
1.0000 | ORAL_TABLET | Freq: Every day | ORAL | Status: DC
Start: 2022-05-27 — End: 2022-05-27

## 2022-05-27 MED ORDER — OXYCODONE HCL 5 MG PO TABS
5.0000 mg | ORAL_TABLET | ORAL | Status: DC | PRN
  Administered 2022-05-27: 5 mg via ORAL
  Administered 2022-05-28: 10 mg via ORAL
  Filled 2022-05-27: qty 1
  Filled 2022-05-27: qty 2

## 2022-05-27 MED ORDER — PROPOFOL 500 MG/50ML IV EMUL
INTRAVENOUS | Status: DC | PRN
  Administered 2022-05-27: 75 ug/kg/min via INTRAVENOUS

## 2022-05-27 MED ORDER — ORAL CARE MOUTH RINSE: 15.0000 mL | Freq: Once | OROMUCOSAL | Status: AC

## 2022-05-27 MED ORDER — CARVEDILOL 25 MG PO TABS
25.0000 mg | ORAL_TABLET | Freq: Two times a day (BID) | ORAL | Status: DC
  Administered 2022-05-27 – 2022-05-28 (×2): 25 mg via ORAL
  Filled 2022-05-27 (×2): qty 1

## 2022-05-27 MED ORDER — PROPOFOL 10 MG/ML IV BOLUS
INTRAVENOUS | Status: AC
  Filled 2022-05-27: qty 20

## 2022-05-27 MED ORDER — PANTOPRAZOLE SODIUM 40 MG PO TBEC
40.0000 mg | DELAYED_RELEASE_TABLET | Freq: Every day | ORAL | Status: DC
  Administered 2022-05-28: 40 mg via ORAL
  Filled 2022-05-27 (×2): qty 1

## 2022-05-27 MED ORDER — LOSARTAN POTASSIUM 50 MG PO TABS
100.0000 mg | ORAL_TABLET | Freq: Every day | ORAL | Status: DC
  Filled 2022-05-27: qty 2

## 2022-05-27 MED ORDER — CEFAZOLIN SODIUM-DEXTROSE 2-4 GM/100ML-% IV SOLN
2.0000 g | Freq: Once | INTRAVENOUS | Status: AC
  Administered 2022-05-27: 2 g via INTRAVENOUS

## 2022-05-27 MED ORDER — BISACODYL 5 MG PO TBEC: 5.0000 mg | DELAYED_RELEASE_TABLET | Freq: Every day | ORAL | Status: DC | PRN

## 2022-05-27 MED ORDER — DIPHENHYDRAMINE HCL 12.5 MG/5ML PO ELIX: 12.5000 mg | ORAL_SOLUTION | ORAL | Status: DC | PRN

## 2022-05-27 MED ORDER — DEXAMETHASONE SODIUM PHOSPHATE 10 MG/ML IJ SOLN
10.0000 mg | Freq: Once | INTRAMUSCULAR | Status: AC
  Administered 2022-05-28: 10 mg via INTRAVENOUS
  Filled 2022-05-27: qty 1

## 2022-05-27 MED ORDER — POLYETHYLENE GLYCOL 3350 17 G PO PACK: 17.0000 g | PACK | Freq: Every day | ORAL | Status: DC | PRN

## 2022-05-27 MED ORDER — ONDANSETRON HCL 4 MG/2ML IJ SOLN: 4.0000 mg | Freq: Four times a day (QID) | INTRAMUSCULAR | Status: DC | PRN

## 2022-05-27 MED ORDER — DULOXETINE HCL 30 MG PO CPEP
30.0000 mg | ORAL_CAPSULE | Freq: Every day | ORAL | Status: DC
  Administered 2022-05-28: 30 mg via ORAL
  Filled 2022-05-27: qty 1

## 2022-05-27 MED ORDER — HYDROCHLOROTHIAZIDE 25 MG PO TABS
25.0000 mg | ORAL_TABLET | Freq: Every day | ORAL | Status: DC
  Filled 2022-05-27: qty 1

## 2022-05-27 MED ORDER — METHOCARBAMOL 1000 MG/10ML IJ SOLN: 500.0000 mg | Freq: Four times a day (QID) | INTRAVENOUS | Status: DC | PRN

## 2022-05-27 MED ORDER — ATORVASTATIN CALCIUM 10 MG PO TABS
10.0000 mg | ORAL_TABLET | ORAL | Status: DC
  Administered 2022-05-27: 10 mg via ORAL
  Filled 2022-05-27 (×2): qty 1

## 2022-05-27 MED ORDER — PROPOFOL 1000 MG/100ML IV EMUL
INTRAVENOUS | Status: AC
  Filled 2022-05-27: qty 100

## 2022-05-27 MED ORDER — FUROSEMIDE 20 MG PO TABS
20.0000 mg | ORAL_TABLET | Freq: Every day | ORAL | Status: DC
  Administered 2022-05-28: 20 mg via ORAL
  Filled 2022-05-27 (×2): qty 1

## 2022-05-27 MED ORDER — LIDOCAINE HCL (PF) 2 % IJ SOLN
INTRAMUSCULAR | Status: AC
  Filled 2022-05-27: qty 5

## 2022-05-27 MED ORDER — FLEET ENEMA 7-19 GM/118ML RE ENEM: 1.0000 | ENEMA | Freq: Once | RECTAL | Status: DC | PRN

## 2022-05-27 MED ORDER — TRANEXAMIC ACID 1000 MG/10ML IV SOLN
2000.0000 mg | Freq: Once | INTRAVENOUS | Status: AC
  Administered 2022-05-27: 2000 mg via TOPICAL
  Filled 2022-05-27: qty 20

## 2022-05-27 MED ORDER — METOCLOPRAMIDE HCL 5 MG/ML IJ SOLN: 5.0000 mg | Freq: Three times a day (TID) | INTRAMUSCULAR | Status: DC | PRN

## 2022-05-27 MED ORDER — CEFAZOLIN SODIUM-DEXTROSE 2-4 GM/100ML-% IV SOLN
INTRAVENOUS | Status: AC
  Filled 2022-05-27: qty 100

## 2022-05-27 MED ORDER — METHOCARBAMOL 500 MG PO TABS: 500.0000 mg | ORAL_TABLET | Freq: Four times a day (QID) | ORAL | Status: DC | PRN

## 2022-05-27 SURGICAL SUPPLY — 52 items
ATTUNE MED DOME PAT 41 KNEE (Knees) ×1 IMPLANT
ATTUNE PS FEM LT SZ 6 CEM KNEE (Femur) ×1 IMPLANT
ATTUNE PSRP INSR SZ6 6 KNEE (Insert) ×1 IMPLANT
BAG COUNTER SPONGE SURGICOUNT (BAG) IMPLANT
BAG DECANTER FOR FLEXI CONT (MISCELLANEOUS) ×2 IMPLANT
BAG SPNG CNTER NS LX DISP (BAG)
BAG ZIPLOCK 12X15 (MISCELLANEOUS) ×2 IMPLANT
BASE TIBIA ATTUNE KNEE SYS SZ6 (Knees) IMPLANT
BLADE SAG 18X100X1.27 (BLADE) ×2 IMPLANT
BLADE SAW SGTL 11.0X1.19X90.0M (BLADE) ×2 IMPLANT
BLADE SURG SZ10 CARB STEEL (BLADE) ×4 IMPLANT
BNDG ELASTIC 6X10 VLCR STRL LF (GAUZE/BANDAGES/DRESSINGS) ×2 IMPLANT
BOWL SMART MIX CTS (DISPOSABLE) ×2 IMPLANT
CEMENT HV SMART SET (Cement) ×4 IMPLANT
COVER SURGICAL LIGHT HANDLE (MISCELLANEOUS) ×2 IMPLANT
CUFF TOURN SGL QUICK 34 (TOURNIQUET CUFF) ×2
CUFF TRNQT CYL 34X4.125X (TOURNIQUET CUFF) ×1 IMPLANT
DRAPE INCISE IOBAN 66X45 STRL (DRAPES) ×2 IMPLANT
DRAPE U-SHAPE 47X51 STRL (DRAPES) ×2 IMPLANT
DRESSING AQUACEL AG SP 3.5X10 (GAUZE/BANDAGES/DRESSINGS) IMPLANT
DRSG AQUACEL AG ADV 3.5X10 (GAUZE/BANDAGES/DRESSINGS) ×2 IMPLANT
DRSG AQUACEL AG SP 3.5X10 (GAUZE/BANDAGES/DRESSINGS) ×2
DURAPREP 26ML APPLICATOR (WOUND CARE) ×2 IMPLANT
ELECT REM PT RETURN 15FT ADLT (MISCELLANEOUS) ×2 IMPLANT
GLOVE BIO SURGEON STRL SZ7.5 (GLOVE) ×2 IMPLANT
GLOVE BIO SURGEON STRL SZ8.5 (GLOVE) ×2 IMPLANT
GLOVE BIOGEL PI IND STRL 8 (GLOVE) ×1 IMPLANT
GLOVE BIOGEL PI IND STRL 9 (GLOVE) ×1 IMPLANT
GLOVE BIOGEL PI INDICATOR 8 (GLOVE) ×1
GLOVE BIOGEL PI INDICATOR 9 (GLOVE) ×1
GOWN STRL REUS W/ TWL XL LVL3 (GOWN DISPOSABLE) ×2 IMPLANT
GOWN STRL REUS W/TWL XL LVL3 (GOWN DISPOSABLE) ×4
HANDPIECE INTERPULSE COAX TIP (DISPOSABLE) ×2
HOOD PEEL AWAY FLYTE STAYCOOL (MISCELLANEOUS) ×6 IMPLANT
KIT TURNOVER KIT A (KITS) IMPLANT
NDL HYPO 21X1.5 SAFETY (NEEDLE) ×2 IMPLANT
NEEDLE HYPO 21X1.5 SAFETY (NEEDLE) ×4 IMPLANT
NS IRRIG 1000ML POUR BTL (IV SOLUTION) ×2 IMPLANT
PACK TOTAL KNEE CUSTOM (KITS) ×2 IMPLANT
PROTECTOR NERVE ULNAR (MISCELLANEOUS) ×2 IMPLANT
SET HNDPC FAN SPRY TIP SCT (DISPOSABLE) ×1 IMPLANT
SPIKE FLUID TRANSFER (MISCELLANEOUS) ×6 IMPLANT
SUT VIC AB 1 CTX 36 (SUTURE) ×2
SUT VIC AB 1 CTX36XBRD ANBCTR (SUTURE) ×1 IMPLANT
SUT VIC AB 3-0 CT1 27 (SUTURE) ×6
SUT VIC AB 3-0 CT1 TAPERPNT 27 (SUTURE) ×3 IMPLANT
SYR CONTROL 10ML LL (SYRINGE) ×4 IMPLANT
TIBIA ATTUNE KNEE SYS BASE SZ6 (Knees) ×2 IMPLANT
TRAY CATH INTERMITTENT SS 16FR (CATHETERS) IMPLANT
TRAY FOLEY MTR SLVR 16FR STAT (SET/KITS/TRAYS/PACK) ×2 IMPLANT
WATER STERILE IRR 1000ML POUR (IV SOLUTION) ×4 IMPLANT
WRAP KNEE MAXI GEL POST OP (GAUZE/BANDAGES/DRESSINGS) ×2 IMPLANT

## 2022-05-27 NOTE — Progress Notes (Signed)
Orthopedic Tech Progress Note Patient Details:  Cynthia Frost 1951-05-31 [id number]  Ortho Devices Type of Ortho Device: Bone foam zero knee Ortho Device/Splint Interventions: Application   Post Interventions Patient Tolerated: Well Instructions Provided: Care of device  Cynthia Frost 05/27/2022, 3:46 PM

## 2022-05-27 NOTE — Transfer of Care (Signed)
Immediate Anesthesia Transfer of Care Note  Patient: Cynthia Frost  Procedure(s) Performed: LEFT TOTAL KNEE ARTHROPLASTY (Left: Knee)  Patient Location: PACU  Anesthesia Type:Regional and Spinal  Level of Consciousness: sedated  Airway & Oxygen Therapy: Patient Spontanous Breathing and Patient connected to face mask oxygen  Post-op Assessment: Report given to RN and Post -op Vital signs reviewed and stable  Post vital signs: Reviewed and stable  Last Vitals:  Vitals Value Taken Time  BP 139/72 05/27/22 1528  Temp    Pulse 44 05/27/22 1529  Resp 17 05/27/22 1529  SpO2 100 % 05/27/22 1529  Vitals shown include unvalidated device data.  Last Pain:  Vitals:   05/27/22 1135  TempSrc:   PainSc: 0-No pain      Patients Stated Pain Goal: 3 (11/12/70 5366)  Complications: No notable events documented.

## 2022-05-27 NOTE — Discharge Instructions (Signed)

## 2022-05-27 NOTE — Evaluation (Signed)
Physical Therapy Evaluation Patient Details Name: Cynthia Frost MRN: [id number] DOB: 12-24-1950 Today's Date: 05/27/2022  History of Present Illness  Pt is a 71yo female presenting s/p L-TKA on 05/27/22. PMH: Afib, anxiety & depression, fibromyalgia, HTN, DM, R-TKA 03/11/22.  Clinical Impression  Cynthia Frost is a 71 y.o. female POD 0 s/p L-TKA. Patient reports modified IND using SPC for mobility at baseline. Patient is now limited by functional impairments (see PT problem list below) and requires supervision for bed mobility and min guard for transfers; pt completed step pivot transfer to/from [facility] with min guard, further mobility deferred secondary to R foot numbness from anesthesia. Patient instructed in exercise to facilitate ROM and circulation to manage edema. Provided incentive spirometer and with Vcs pt able to achieve 1273m. Patient will benefit from continued skilled PT interventions to address impairments and progress towards PLOF. Acute PT will follow to progress mobility and stair training in preparation for safe discharge home.       Recommendations for follow up therapy are one component of a multi-disciplinary discharge planning process, led by the attending physician.  Recommendations may be updated based on patient status, additional functional criteria and insurance authorization.  Follow Up Recommendations Follow physician's recommendations for discharge plan and follow up therapies      Assistance Recommended at Discharge Intermittent Supervision/Assistance  Patient can return home with the following  A little help with walking and/or transfers;A little help with bathing/dressing/bathroom;Assistance with cooking/housework;Assist for transportation;Help with stairs or ramp for entrance    Equipment Recommendations None recommended by PT (Pt has recommended DME)  Recommendations for Other Services       Functional Status Assessment Patient has had a recent decline in  their functional status and demonstrates the ability to make significant improvements in function in a reasonable and predictable amount of time.     Precautions / Restrictions Precautions Precautions: Fall Restrictions Weight Bearing Restrictions: No Other Position/Activity Restrictions: WBAT      Mobility  Bed Mobility Overal bed mobility: Independent, Needs Assistance Bed Mobility: Supine to Sit, Sit to Supine     Supine to sit: Supervision Sit to supine: Supervision   General bed mobility comments: For safety only, no physical assist required.    Transfers Overall transfer level: Needs assistance Equipment used: Rolling walker (2 wheels) Transfers: Sit to/from Stand, Bed to chair/wheelchair/BSC Sit to Stand: Min guard, From elevated surface   Step pivot transfers: Min guard       General transfer comment: Min guard for safety only, no physical assist required. In standing, pt completed lateral weightshifts and marches to demonstrate weight-bearing status prior [facility] Further mobility deferred secondary to numbness in [place].    Ambulation/Gait               General Gait Details: deferred  Stairs            Wheelchair Mobility    Modified Rankin (Stroke Patients Only)       Balance Overall balance assessment: Needs assistance Sitting-balance support: Feet supported, No upper extremity supported Sitting balance-Leahy Scale: Good     Standing balance support: Reliant on assistive device for balance, During functional activity, Bilateral upper extremity supported Standing balance-Leahy Scale: Poor                               Pertinent Vitals/Pain Pain Assessment Pain Assessment: No/denies pain    Home Living  Family/patient expects to be discharged to:: Private residence Living Arrangements: Spouse/significant other Available Help at Discharge: Family;Available 24 hours/day Type of Home:  House Home Access: Level entry ([facility] only)     Alternate Level Stairs-Number of Steps: 13 Home Layout: Two level;Other (Comment);Bed/bath upstairs (Placing bed downstairs but will have to traverse stairs to shower) Home Equipment: Rolling Walker (2 wheels);Shower seat;BSC/3in1      Prior Function Prior Level of Function : Independent/Modified Independent             Mobility Comments: uses SPC for mobility ADLs Comments: ind     Hand Dominance        Extremity/Trunk Assessment   Upper Extremity Assessment Upper Extremity Assessment: Overall WFL for tasks assessed    Lower Extremity Assessment Lower Extremity Assessment: RLE deficits/detail;LLE deficits/detail RLE Deficits / Details: MMT  ank DF/PF 4/5 RLE Sensation: decreased light touch LLE Deficits / Details: MMT  ank DF/PF 4/5, no extensor lag noted LLE Sensation: decreased light touch    Cervical / Trunk Assessment Cervical / Trunk Assessment: Kyphotic  Communication   Communication: No difficulties  Cognition Arousal/Alertness: Awake/alert Behavior During Therapy: WFL for tasks assessed/performed Overall Cognitive Status: Within Functional Limits for tasks assessed                                          General Comments      Exercises Total Joint Exercises Ankle Circles/Pumps: AROM, Both, 10 reps   Assessment/Plan    PT Assessment Patient needs continued PT services  PT Problem List Decreased strength;Decreased range of motion;Decreased activity tolerance;Decreased balance;Decreased mobility;Decreased coordination;Pain       PT Treatment Interventions DME instruction;Gait training;Stair training;Functional mobility training;Therapeutic activities;Therapeutic exercise;Balance training;Neuromuscular re-education;Patient/family education    PT Goals (Current goals can be found in the Care Plan section)  Acute Rehab PT Goals Patient Stated Goal: Swimming, yoga PT Goal  Formulation: With patient Time For Goal Achievement: 06/03/22 Potential to Achieve Goals: Good    Frequency 7X/week     Co-evaluation               AM-PAC PT "6 Clicks" Mobility  Outcome Measure Help needed turning from your back to your side while in a flat bed without using bedrails?: None Help needed moving from lying on your back to sitting on the side of a flat bed without using bedrails?: None Help needed moving to and from a bed to a chair (including a wheelchair)?: A Little Help needed standing up from a chair using your arms (e.g., wheelchair or bedside chair)?: A Little Help needed to walk in hospital room?: A Little Help needed climbing 3-5 steps with a railing? : A Little 6 Click Score: 20    End of Session Equipment Utilized During Treatment: Gait belt Activity Tolerance: No increased pain;Patient tolerated treatment well Patient left: in bed;with call bell/phone within reach;with bed alarm set;with SCD's reapplied Nurse Communication: Mobility status PT Visit Diagnosis: Difficulty in walking, not elsewhere classified (R26.2)    Time: 0277-4128 PT Time Calculation (min) (ACUTE ONLY): 24 min   Charges:   PT Evaluation $PT Eval Low Complexity: 1 Low PT Treatments $Therapeutic Activity: 8-22 mins        Coolidge Breeze, PT, DPT [facility] Office: [phone] Pager: [phone]  Coolidge Breeze 05/27/2022, 6:35 PM

## 2022-05-27 NOTE — Op Note (Signed)
PATIENT ID:      Cynthia Frost  MRN:     [id number] DOB/AGE:    73-Oct-1952 / 73 y.o.       OPERATIVE REPORT   DATE OF PROCEDURE:  05/27/2022      PREOPERATIVE DIAGNOSIS:   LEFT KNEE OSTEOARTHRITIS      Estimated body mass index is 39.94 kg/m as calculated from the following:   Height as of this encounter: '5\' 5"'$  (1.651 m).   Weight as of this encounter: 108.9 kg.                                                       POSTOPERATIVE DIAGNOSIS:   Same                                                                  PROCEDURE:  Procedure(s): LEFT TOTAL KNEE ARTHROPLASTY Using DepuyAttune RP implants #6L Femur, #6Tibia, 6 mm Attune RP bearing, 41 Patella    SURGEON: Kerin Salen  ASSISTANT:   Kerry Hough. [employer]   (Present and scrubbed throughout the case, critical for assistance with exposure, retraction, instrumentation, and closure.)        ANESTHESIA: GRT, 20cc Exparel, 50cc 0.25% Marcaine EBL: 300 cc FLUID REPLACEMENT: 1500 cc crystaloid TOURNIQUET: DRAINS: None TRANEXAMIC ACID: 1gm IV, 2gm topical COMPLICATIONS:  None         INDICATIONS FOR PROCEDURE: The patient has  LEFT KNEE OSTEOARTHRITIS, Var deformities, XR shows bone on bone arthritis, lateral subluxation of tibia. Patient has failed all conservative measures including anti-inflammatory medicines, narcotics, attempts at exercise and weight loss, cortisone injections and viscosupplementation.  Risks and benefits of surgery have been discussed, questions answered.   DESCRIPTION OF PROCEDURE: The patient identified by armband, received  IV antibiotics, in the holding area at [facility]. Patient taken to the operating room, appropriate anesthetic monitors were attached, and GET anesthesia was  induced. IV Tranexamic acid was given.Tourniquet applied high to the operative thigh. Lateral post and foot positioner applied to the table, the lower extremity was then prepped and draped in usual sterile fashion from the toes to  the tourniquet. Time-out procedure was performed. Kerry Hough. [facility] PAC, was present and scrubbed throughout the case, critical for assistance with, positioning, exposure, retraction, instrumentation, and closure.The skin and subcutaneous tissue along the incision was injected with 20 cc of a mixture of Exparel and Marcaine solution, using a 20-gauge by 1-1/2 inch needle. We began the operation, with the knee flexed 130 degrees, by making the anterior midline incision starting at handbreadth above the patella going over the patella 1 cm medial to and 4 cm distal to the tibial tubercle. Small bleeders in the skin and the subcutaneous tissue identified and cauterized. Transverse retinaculum was incised and reflected medially and a medial parapatellar arthrotomy was accomplished. the patella was everted and theprepatellar fat pad resected. The superficial medial collateral ligament was then elevated from anterior to posterior along the proximal flare of the tibia and anterior half of the menisci resected. The knee was hyperflexed exposing bone on bone arthritis. Peripheral and  notch osteophytes as well as the cruciate ligaments were then resected. We continued to work our way around posteriorly along the proximal tibia, and externally rotated the tibia subluxing it out from underneath the femur. A McHale PCL retractor was placed through the notch and a lateral Hohmann retractor placed, and we then entered the proximal tibia in line with the Depuy starter drill in line with the axis of the tibia followed by an intramedullary guide rod and 0-degree posterior slope cutting guide. The tibial cutting guide, 4 degree posterior sloped, was pinned into place allowing resection of 4 mm of bone medially and 10 mm of bone laterally. Satisfied with the tibial resection, we then entered the distal femur 2 mm anterior to the PCL origin with the intramedullary guide rod and applied the distal femoral cutting guide set at 9 mm, with 5  degrees of valgus. This was pinned along the epicondylar axis. At this point, the distal femoral cut was accomplished without difficulty. We then sized for a #6R femoral component and pinned the guide in 3 degrees of external rotation. The chamfer cutting guide was pinned into place. The anterior, posterior, and chamfer cuts were accomplished without difficulty followed by the Attune RP box cutting guide and the box cut. We also removed posterior osteophytes from the posterior femoral condyles. The posterior capsule was injected with Exparel solution. The knee was brought into full extension. We checked our extension gap and fit a 6 mm bearing. Distracting in extension with a lamina spreader,  bleeders in the posterior capsule, Posterior medial and posterior lateral gutter were cauterized.  The transexamic acid-soaked sponge was then placed in the gap of the knee in extension. The knee was flexed 30. The posterior patella cut was accomplished with the 9.5 mm Attune cutting guide, sized for a 78m dome, and the fixation pegs drilled.The knee was then once again hyperflexed exposing the proximal tibia. We sized for a # 6 tibial base plate, applied the smokestack and the conical reamer followed by the the Delta fin keel punch. We then hammered into place the Attune RP trial femoral component, drilled the lugs, inserted a  6 mm trial bearing, trial patellar button, and took the knee through range of motion from 0-130 degrees. Medial and lateral ligamentous stability was checked. No thumb pressure was required for patellar Tracking. The tourniquet was not used. All trial components were removed, mating surfaces irrigated with pulse lavage, and dried with suction and sponges. 10 cc of the Exparel solution was applied to the cancellus bone of the patella distal femur and proximal tibia.  After waiting 30 seconds, the bony surfaces were again, dried with sponges. A double batch of DePuy HV cement was mixed and applied to  all bony metallic mating surfaces except for the posterior condyles of the femur itself. In order, we hammered into place the tibial tray and removed excess cement, the femoral component and removed excess cement. The final Attune RP bearing was inserted, and the knee brought to full extension with compression. The patellar button was clamped into place, and excess cement removed. The knee was held at 30 flexion with compression, while the cement cured. The wound was irrigated out with normal saline solution pulse lavage. The rest of the Exparel was injected into the parapatellar arthrotomy, subcutaneous tissues, and periosteal tissues. The parapatellar arthrotomy was closed with running #1 Vicryl suture. The subcutaneous tissue with 3-0 undyed Vicryl suture, and the skin with running 3-0 SQ vicryl. An Aquacil and Ace  wrap were applied. The patient was taken to recovery room without difficulty.   Kerin Salen 05/27/2022, 2:38 PM

## 2022-05-27 NOTE — Anesthesia Procedure Notes (Signed)
Spinal  Patient location during procedure: OR Start time: 05/27/2022 1:12 PM End time: 05/27/2022 1:14 PM Reason for block: surgical anesthesia Staffing Performed: resident/CRNA  Anesthesiologist: Janeece Riggers, MD Resident/CRNA: Lind Covert, CRNA Performed by: Lind Covert, CRNA Authorized by: Janeece Riggers, MD   Spinal Block Patient position: sitting Prep: DuraPrep Patient monitoring: heart rate, cardiac monitor, continuous pulse ox and blood pressure Approach: midline Location: L3-4 Injection technique: single-shot Needle Needle type: Pencan  Needle gauge: 24 G Needle length: 10 cm Needle insertion depth: 8 cm Assessment Sensory level: T6 Events: CSF return Additional Notes Timeout performed. Patient in sitting position. L3-4 identified. Site cleansed with Duraprep. SAB without difficulty.Patient to supine position

## 2022-05-27 NOTE — Anesthesia Procedure Notes (Signed)
Anesthesia Regional Block: Adductor canal block   Pre-Anesthetic Checklist: , timeout performed,  Correct Patient, Correct Site, Correct Laterality,  Correct Procedure, Correct Position, site marked,  Risks and benefits discussed,  Surgical consent,  Pre-op evaluation,  At surgeon's request and post-op pain management  Laterality: Left  Prep: chloraprep       Needles:  Injection technique: Single-shot  Needle Type: Echogenic Stimulator Needle     Needle Length: 5cm  Needle Gauge: 22     Additional Needles:   Procedures:, nerve stimulator,,, ultrasound used (permanent image in chart),,    Narrative:  Start time: 05/27/2022 11:35 AM End time: 05/27/2022 11:40 AM Injection made incrementally with aspirations every 5 mL.  Performed by: Personally  Anesthesiologist: Janeece Riggers, MD  Additional Notes: Functioning IV was confirmed and monitors were applied.  A 81m 22ga Arrow echogenic stimulator needle was used. Sterile prep and drape,hand hygiene and sterile gloves were used. Ultrasound guidance: relevant anatomy identified, needle position confirmed, local anesthetic spread visualized around nerve(s)., vascular puncture avoided.  Image printed for medical record. Negative aspiration and negative test dose prior to incremental administration of local anesthetic. The patient tolerated the procedure well.

## 2022-05-27 NOTE — Interval H&P Note (Signed)
History and Physical Interval Note:  05/27/2022 10:14 AM  Cynthia Frost  has presented today for surgery, with the diagnosis of LEFT KNEE OSTEOARTHRITIS.  The various methods of treatment have been discussed with the patient and family. After consideration of risks, benefits and other options for treatment, the patient has consented to  Procedure(s): LEFT TOTAL KNEE ARTHROPLASTY (Left) as a surgical intervention.  The patient's history has been reviewed, patient examined, no change in status, stable for surgery.  I have reviewed the patient's chart and labs.  Questions were answered to the patient's satisfaction.     Kerin Salen

## 2022-05-28 ENCOUNTER — Encounter (HOSPITAL_COMMUNITY): Payer: Self-pay | Admitting: Orthopedic Surgery

## 2022-05-28 DIAGNOSIS — I10 Essential (primary) hypertension: Secondary | ICD-10-CM | POA: Diagnosis not present

## 2022-05-28 DIAGNOSIS — M1712 Unilateral primary osteoarthritis, left knee: Secondary | ICD-10-CM | POA: Diagnosis not present

## 2022-05-28 DIAGNOSIS — E1136 Type 2 diabetes mellitus with diabetic cataract: Secondary | ICD-10-CM | POA: Diagnosis not present

## 2022-05-28 DIAGNOSIS — D62 Acute posthemorrhagic anemia: Secondary | ICD-10-CM | POA: Diagnosis not present

## 2022-05-28 DIAGNOSIS — E113593 Type 2 diabetes mellitus with proliferative diabetic retinopathy without macular edema, bilateral: Secondary | ICD-10-CM | POA: Diagnosis not present

## 2022-05-28 MED ORDER — TIZANIDINE HCL 2 MG PO CAPS: 2.0000 mg | ORAL_CAPSULE | Freq: Three times a day (TID) | ORAL | 0 refills | Status: AC | PRN

## 2022-05-28 MED ORDER — OXYCODONE HCL 5 MG PO TABS: 5.0000 mg | ORAL_TABLET | ORAL | 0 refills | Status: AC | PRN

## 2022-05-28 MED ORDER — APIXABAN 2.5 MG PO TABS
2.5000 mg | ORAL_TABLET | Freq: Two times a day (BID) | ORAL | 0 refills | Status: DC
Start: 2022-05-28 — End: 2022-08-13

## 2022-05-28 NOTE — Anesthesia Postprocedure Evaluation (Signed)
Anesthesia Post Note  Patient: Cynthia Frost  Procedure(s) Performed: LEFT TOTAL KNEE ARTHROPLASTY (Left: Knee)     Patient location during evaluation: PACU Anesthesia Type: Regional and Spinal Level of consciousness: awake and alert Pain management: pain level controlled Vital Signs Assessment: post-procedure vital signs reviewed and stable Respiratory status: spontaneous breathing, nonlabored ventilation, respiratory function stable and patient connected to nasal cannula oxygen Cardiovascular status: blood pressure returned to baseline and stable Postop Assessment: no apparent nausea or vomiting Anesthetic complications: no   No notable events documented.  Last Vitals:  Vitals:   05/28/22 0632 05/28/22 0916  BP: 118/61 (!) 124/57  Pulse: 65 69  Resp: 18 16  Temp: 36.6 C 37 C  SpO2: 93% (!) 89%    Last Pain:  Vitals:   05/28/22 0916  TempSrc: Oral  PainSc:                  [name]

## 2022-05-28 NOTE — Progress Notes (Signed)
Discharge instructions reviewed with patient. Patient verbalizes understanding.

## 2022-05-28 NOTE — Plan of Care (Signed)
  Problem: Activity: Goal: Risk for activity intolerance will decrease Outcome: Progressing   Problem: Pain Managment: Goal: General experience of comfort will improve Outcome: Progressing   Problem: Safety: Goal: Ability to remain free from injury will improve Outcome: Progressing   

## 2022-05-28 NOTE — TOC Transition Note (Signed)
Transition of Care [facility]) - CM/SW Discharge Note  Patient Details  Name: Cynthia Frost MRN: [id number] Date of Birth: 02/05/1951  Transition of Care [facility]) CM/SW Contact:  Sherie Don, LCSW Phone Number: 05/28/2022, 10:30 AM  Clinical Narrative: Patient is expected to discharge home after working with PT. CSW met with patient to confirm discharge plan. Patient will go home with OPPT at [facility]. Patient has a rolling walker at home, so there are no DME needs at this time.  Final next level of care: OP Rehab Barriers to Discharge: No Barriers Identified  Patient Goals and CMS Choice Patient states their goals for this hospitalization and ongoing recovery are:: Discharge home with OPPT at [facility]. Choice offered to / list presented to : NA  Discharge Plan and Services        DME Arranged: N/A DME Agency: NA  Readmission Risk Interventions     No data to display

## 2022-05-28 NOTE — Progress Notes (Signed)
Physical Therapy Treatment Patient Details Name: Cynthia Frost MRN: [id number] DOB: 1951/10/16 Today's Date: 05/28/2022   History of Present Illness Pt is a 71yo female presenting s/p L-TKA on 05/27/22. PMH: Afib, anxiety & depression, fibromyalgia, HTN, DM, R-TKA 03/11/22.    PT Comments    Pt ambulated in hallway and performed LE exercises.  Pt will have hospital bed on main level so did not need to practice steps prior to d/c.  Pt feels ready for d/c home and provided with HEP handout.    Recommendations for follow up therapy are one component of a multi-disciplinary discharge planning process, led by the attending physician.  Recommendations may be updated based on patient status, additional functional criteria and insurance authorization.  Follow Up Recommendations  Follow physician's recommendations for discharge plan and follow up therapies     Assistance Recommended at Discharge Intermittent Supervision/Assistance  Patient can return home with the following A little help with walking and/or transfers;A little help with bathing/dressing/bathroom;Assistance with cooking/housework;Assist for transportation;Help with stairs or ramp for entrance   Equipment Recommendations  None recommended by PT    Recommendations for Other Services       Precautions / Restrictions Precautions Precautions: Fall;Knee Restrictions Weight Bearing Restrictions: No Other Position/Activity Restrictions: WBAT     Mobility  Bed Mobility Overal bed mobility: Modified Independent                  Transfers Overall transfer level: Needs assistance Equipment used: Rolling walker (2 wheels) Transfers: Sit to/from Stand Sit to Stand: Min guard, From elevated surface           General transfer comment: verbal cues for UE and LE positioning    Ambulation/Gait Ambulation/Gait assistance: Min guard Gait Distance (Feet): 80 Feet Assistive device: Rolling walker (2 wheels) Gait  Pattern/deviations: Step-to pattern, Step-through pattern, Decreased stance time - left, Antalgic       General Gait Details: verbal cues for sequence, step length, posture   Stairs             Wheelchair Mobility    Modified Rankin (Stroke Patients Only)       Balance                                            Cognition Arousal/Alertness: Awake/alert Behavior During Therapy: WFL for tasks assessed/performed Overall Cognitive Status: Within Functional Limits for tasks assessed                                          Exercises Total Joint Exercises Ankle Circles/Pumps: AROM, Both, 10 reps Quad Sets: AROM, Both, 10 reps Heel Slides: AAROM, Left, 10 reps Hip ABduction/ADduction: AROM, Left, 10 reps Straight Leg Raises: AROM, Left, 10 reps Knee Flexion: AROM, Left, 10 reps    General Comments        Pertinent Vitals/Pain Pain Assessment Pain Assessment: 0-10 Pain Score: 4  Pain Location: left knee Pain Descriptors / Indicators: Sore Pain Intervention(s): Repositioned, Premedicated before session, Monitored during session, Ice applied    Home Living                          Prior Function  PT Goals (current goals can now be found in the care plan section) Progress towards PT goals: Progressing toward goals    Frequency    7X/week      PT Plan Current plan remains appropriate    Co-evaluation              AM-PAC PT "6 Clicks" Mobility   Outcome Measure  Help needed turning from your back to your side while in a flat bed without using bedrails?: None Help needed moving from lying on your back to sitting on the side of a flat bed without using bedrails?: None Help needed moving to and from a bed to a chair (including a wheelchair)?: A Little Help needed standing up from a chair using your arms (e.g., wheelchair or bedside chair)?: A Little Help needed to walk in hospital room?: A  Little Help needed climbing 3-5 steps with a railing? : A Little 6 Click Score: 20    End of Session Equipment Utilized During Treatment: Right knee immobilizer Activity Tolerance: No increased pain;Patient tolerated treatment well Patient left: with call bell/phone within reach;in chair;with chair alarm set Nurse Communication: Mobility status PT Visit Diagnosis: Difficulty in walking, not elsewhere classified (R26.2)     Time: 5883-2549 PT Time Calculation (min) (ACUTE ONLY): 18 min  Charges:  $Gait Training: 8-22 mins                     Arlyce Dice, DPT Physical Therapist Acute Rehabilitation Services Preferred contact method: Secure Chat Weekend Pager Only: [phone] Office: [place] 05/28/2022, 11:50 AM

## 2022-05-28 NOTE — Discharge Summary (Signed)
Patient ID: Cynthia Frost MRN: [id number] DOB/AGE: Aug 03, 1951 71 y.o.  Admit date: 05/27/2022 Discharge date: 05/28/2022  Admission Diagnoses:  Principal Problem:   Degenerative arthritis of left knee Active Problems:   S/P total knee arthroplasty, left   Discharge Diagnoses:  Same  Past Medical History:  Diagnosis Date   Anxiety    Arthritis    Atrial fibrillation ([place])    Depression    Dysrhythmia    Afib   Full dentures    Hypertension    Hypertension    Joint pain    Lactose intolerance    Osteoarthritis    Palpitations    Pre-diabetes    Prediabetes    Proliferative diabetic retinopathy of left eye determined by examination ([place]) 04/25/2022   Stenosis of right middle cerebral artery 2011   Wears glasses     Surgeries: Procedure(s): LEFT TOTAL KNEE ARTHROPLASTY on 05/27/2022   Consultants:   Discharged Condition: Improved  Hospital Course: Cynthia Frost is an 71 y.o. female who was admitted 05/27/2022 for operative treatment ofDegenerative arthritis of left knee. Patient has severe unremitting pain that affects sleep, daily activities, and work/hobbies. After pre-op clearance the patient was taken to the operating room on 05/27/2022 and underwent  Procedure(s): LEFT TOTAL KNEE ARTHROPLASTY.    Patient was given perioperative antibiotics:  Anti-infectives (From admission, onward)    Start     Dose/Rate Route Frequency Ordered Stop   05/27/22 1030  ceFAZolin (ANCEF) IVPB 2g/100 mL premix        2 g 200 mL/hr over 30 Minutes Intravenous  Once 05/27/22 1025 05/27/22 1315   05/27/22 1027  ceFAZolin (ANCEF) 2-4 GM/100ML-% IVPB       Note to Pharmacy: [place] [facility] E: cabinet override      05/27/22 1027 05/27/22 1326        Patient was given sequential compression devices, early ambulation, and chemoprophylaxis to prevent DVT.  Patient benefited maximally from hospital stay and there were no complications.    Recent vital signs: Patient Vitals for the past  24 hrs:  BP Temp Temp src Pulse Resp SpO2 Height Weight  05/28/22 0632 118/61 97.8 F (36.6 C) -- 65 18 93 % -- --  05/28/22 0152 (!) 117/59 97.8 F (36.6 C) -- 64 18 93 % -- --  05/27/22 2214 129/64 97.6 F (36.4 C) Oral 71 18 91 % -- --  05/27/22 1847 139/71 98 F (36.7 C) -- 66 19 92 % -- --  05/27/22 1647 132/70 97.8 F (36.6 C) Oral (!) 54 -- 100 % -- --  05/27/22 1630 (!) 155/77 (!) 97.4 F (36.3 C) -- 60 15 100 % -- --  05/27/22 1615 (!) 156/62 -- -- (!) 55 15 100 % -- --  05/27/22 1600 (!) 149/75 -- -- (!) 48 16 100 % -- --  05/27/22 1545 132/83 -- -- (!) 50 16 100 % -- --  05/27/22 1530 139/72 -- -- (!) 44 17 100 % -- --  05/27/22 1528 139/72 (!) 97.5 F (36.4 C) -- 67 15 99 % -- --  05/27/22 1135 (!) 145/66 -- -- 64 16 99 % -- --  05/27/22 1130 (!) 159/80 -- -- 72 20 99 % -- --  05/27/22 1125 (!) 121/95 -- -- (!) 59 18 99 % -- --  05/27/22 0924 -- -- -- -- -- -- '5\' 5"'$  (1.651 m) 108.9 kg  05/27/22 0845 (!) 148/94 98.2 F (36.8 C) Oral 75 18 97 % -- --  Recent laboratory studies: No results for input(s): "WBC", "HGB", "HCT", "PLT", "NA", "K", "CL", "CO2", "BUN", "CREATININE", "GLUCOSE", "INR", "CALCIUM" in the last 72 hours.  Invalid input(s): "PT", "2"   Discharge Medications:   Allergies as of 05/28/2022       Reactions   Corticosteroids Other (See Comments)   Increases glucose levels   Escitalopram Oxalate Diarrhea   diarrhea/poor sleep (08/2019)   Lactose Intolerance (gi) Diarrhea   Rosuvastatin Calcium    Other reaction(s): nose bleeds   Tomato Other (See Comments)   Arthritis        Medication List     TAKE these medications    amLODipine 10 MG tablet Commonly known as: NORVASC TAKE 1 TABLET DAILY   apixaban 2.5 MG Tabs tablet Commonly known as: ELIQUIS Take 1 tablet (2.5 mg total) by mouth 2 (two) times daily. What changed:  medication strength how much to take   atorvastatin 10 MG tablet Commonly known as: LIPITOR Take 10 mg by  mouth 3 (three) times a week.   B-12 PO Place 1 Dose under the tongue at bedtime.   carvedilol 25 MG tablet Commonly known as: COREG Take 0.5 tablets (12.5 mg total) by mouth daily. What changed:  how much to take when to take this   clopidogrel 75 MG tablet Commonly known as: PLAVIX Take 75 mg by mouth daily.   diazepam 5 MG tablet Commonly known as: VALIUM Take 5 mg by mouth once as needed for anxiety. Prior to surgery   Difluprednate 0.05 % Emul Place 1 drop into the right eye See admin instructions. Instill 1 drop into right eye twice daily until 7/12 then instill 1 drop daily in the right eye for 7 days   doxazosin 8 MG tablet Commonly known as: CARDURA Take 8 mg by mouth daily.   DULoxetine 30 MG capsule Commonly known as: CYMBALTA Take 30 mg by mouth daily.   furosemide 20 MG tablet Commonly known as: LASIX Take 20 mg by mouth daily.   ketorolac 0.5 % ophthalmic solution Commonly known as: ACULAR Place 1 drop into the right eye 4 (four) times daily.   losartan-hydrochlorothiazide 100-25 MG tablet Commonly known as: HYZAAR Take 1 tablet by mouth daily.   multivitamin with minerals Tabs tablet Take 1 tablet by mouth daily.   OMEGA-3 FISH OIL PO Take 1 capsule by mouth daily.   OSTEO BI-FLEX ONE PER DAY PO Take 2 tablets by mouth daily.   OVER THE COUNTER MEDICATION Take 2 capsules by mouth daily. Super Beets   oxyCODONE 5 MG immediate release tablet Commonly known as: Roxicodone Take 1-2 tablets (5-10 mg total) by mouth every 4 (four) hours as needed for up to 7 days for severe pain.   SALONPAS PAIN RELIEF PATCH EX Apply 1 patch topically daily as needed (pain).   tizanidine 2 MG capsule Commonly known as: Zanaflex Take 1 capsule (2 mg total) by mouth 3 (three) times daily as needed for muscle spasms.   TURMERIC PO Take 2 capsules by mouth daily. Chew   Vitamin D3 75 MCG (3000 UT) Tabs Take 1 tablet by mouth daily. What changed: when to take  this               Durable Medical Equipment  (From admission, onward)           Start     Ordered   05/27/22 1702  DME Walker rolling  Once       Question:  Patient needs a walker to treat with the following condition  Answer:  Status post total left knee replacement   05/27/22 1702   05/27/22 1702  DME 3 n 1  Once        05/27/22 1702              Discharge Care Instructions  (From admission, onward)           Start     Ordered   05/28/22 0000  Change dressing       Comments: Change dressing Only if drainage exceeds 40% of window on dressing   05/28/22 0802            Diagnostic Studies: Panretinal Photocoagulation - OD - Right Eye  Result Date: 05/06/2022 Time Out Confirmed correct patient, procedure, site, and patient consented. Anesthesia Topical anesthesia was used. Anesthetic medications included Proparacaine 0.5%. Laser Information The type of laser was diode. Color was yellow. The duration in seconds was 0.03. The spot size was 390 microns. Laser power was 4. Total spots was 313. Post-op The patient tolerated the procedure well. There were no complications. The patient received written and verbal post procedure care education. Notes Local PRP temporal retina watershed region with his avascularity and border zone of neovascular fronds.  Panretinal Photocoagulation - OS - Left Eye  Result Date: 05/02/2022 Anesthesia Topical anesthesia was used. Anesthetic medications included Proparacaine 0.5%. Laser Information The type of laser was diode. Color was yellow. The duration in seconds was 0.02. The spot size was 390 microns. Laser power was 300. Total spots was 411. Post-op The patient tolerated the procedure well. There were no complications. The patient received written and verbal post procedure care education. Notes CHRPE delivered temporal periphery of the left eye treated today guided by fluorescein angiography and border zone of neovascularization  elsewhere.  Fluorescein Angiography Optos (Transit OS)  Result Date: 05/02/2022 Right Eye Mid/Late phase findings include retinal neovascularization. Left Eye Early phase findings include leakage, retinal neovascularization. Mid/Late phase findings include leakage, retinal neovascularization. Choroidal neovascularization is not present. Notes Temporal periphery of the retina OU with large regions of nonperfusion with border zone neovascularization elsewhere high risk for bleeding scar tissue retinal detachment developing OS large frond of neovascularization also noted on nasal  Color Fundus Photography Optos - OU - Both Eyes  Result Date: 05/02/2022 Right Eye Progression has no prior data. Disc findings include normal observations. Macula : normal observations. Periphery : neovascularization. Left Eye Progression has no prior data. Notes Central vitreous opacity, hemorrhage most likely from recent PVD triggering peripheral neovascularization seen temporally to bleed. Not likely a hole or tear. OD will need vitrectomy if hemorrhage does not clear however at this time I would recommend cataract surgery in the right eye so as to begin the clearing process whereupon if I need to do another surgery to clear the hemorrhage the cataract is the way safely to get the long-term the best visual outcome Peripheral retinal nonperfusion confirmed today by fluorescein angiography   Disposition: Discharge disposition: 01-Home or Self Care       Discharge Instructions     Call MD / Call 911   Complete by: As directed    If you experience chest pain or shortness of breath, CALL 911 and be transported to the hospital emergency room.  If you develope a fever above 101 F, pus (white drainage) or increased drainage or redness at the wound, or calf pain, call your surgeon's office.   Change  dressing   Complete by: As directed    Change dressing Only if drainage exceeds 40% of window on dressing   Constipation  Prevention   Complete by: As directed    Drink plenty of fluids.  Prune juice may be helpful.  You may use a stool softener, such as Colace (over the counter) 100 mg twice a day.  Use MiraLax (over the counter) for constipation as needed.   Diet - low sodium heart healthy   Complete by: As directed    Increase activity slowly as tolerated   Complete by: As directed    Post-operative opioid taper instructions:   Complete by: As directed    POST-OPERATIVE OPIOID TAPER INSTRUCTIONS: It is important to wean off of your opioid medication as soon as possible. If you do not need pain medication after your surgery it is ok to stop day one. Opioids include: Codeine, Hydrocodone(Norco, Vicodin), Oxycodone(Percocet, oxycontin) and hydromorphone amongst others.  Long term and even short term use of opiods can cause: Increased pain response Dependence Constipation Depression Respiratory depression And more.  Withdrawal symptoms can include Flu like symptoms Nausea, vomiting And more Techniques to manage these symptoms Hydrate well Eat regular healthy meals Stay active Use relaxation techniques(deep breathing, meditating, yoga) Do Not substitute Alcohol to help with tapering If you have been on opioids for less than two weeks and do not have pain than it is ok to stop all together.  Plan to wean off of opioids This plan should start within one week post op of your joint replacement. Maintain the same interval or time between taking each dose and first decrease the dose.  Cut the total daily intake of opioids by one tablet each day Next start to increase the time between doses. The last dose that should be eliminated is the evening dose.           Follow-up Information     Frederik Pear, MD. Go on 06/05/2022.   Specialty: Orthopedic Surgery Why: Your appointment is scheduled for 11:45 Contact information: [place] [address] [phone]         [facility], [place]. Go on 05/29/2022.   Why: Your outpatient physical therapy is scheduled for 3:20. Please arrive at 3:00 to complete your paperwork Contact information: [facility] [place] [address] [phone]                  Signed: Kerin Salen 05/28/2022, 8:03 AM

## 2022-05-28 NOTE — Progress Notes (Signed)
PATIENT ID: Cynthia Frost  MRN: [id number]  DOB/AGE:  [birth year] / 71 y.o.  1 Day Post-Op Procedure(s) (LRB): LEFT TOTAL KNEE ARTHROPLASTY (Left)    PROGRESS NOTE Subjective: Patient is alert, oriented, no Nausea, no Vomiting, yes passing gas. Taking PO well. Denies SOB, Chest or Calf Pain. Using Incentive Spirometer, PAS in place. Ambulate WBAT, Patient reports pain as 2/10 .    Objective: Vital signs in last 24 hours: Vitals:   05/27/22 1847 05/27/22 2214 05/28/22 0152 05/28/22 0632  BP: 139/71 129/64 (!) 117/59 118/61  Pulse: 66 71 64 65  Resp: '19 18 18 18  '$ Temp: 98 F (36.7 C) 97.6 F (36.4 C) 97.8 F (36.6 C) 97.8 F (36.6 C)  TempSrc:  Oral    SpO2: 92% 91% 93% 93%  Weight:      Height:          Intake/Output from previous day: I/O last 3 completed shifts: In: 3412.1 [P.O.:800; I.V.:2612.1] Out: 250 [Urine:200; Blood:50]   Intake/Output this shift: No intake/output data recorded.   LABORATORY DATA: Recent Labs    05/27/22 0847  GLUCAP 131*    Examination: Neurologically intact ABD soft Neurovascular intact Sensation intact distally Intact pulses distally Dorsiflexion/Plantar flexion intact Incision: scant drainage No cellulitis present Compartment soft}  Assessment:   1 Day Post-Op Procedure(s) (LRB): LEFT TOTAL KNEE ARTHROPLASTY (Left) ADDITIONAL DIAGNOSIS: Expected Acute Blood Loss Anemia, Diabetes, morbid obesity, essential hypertension,  Patient's anticipated LOS is less than 2 midnights, meeting these requirements: - Younger than 73 - Lives within 1 hour of care - Has a competent adult at home to recover with post-op recover - NO history of  - Chronic pain requiring opiods  - Diabetes  - Coronary Artery Disease  - Heart failure  - Heart attack  - Stroke  - DVT/VTE  - Cardiac arrhythmia  - Respiratory Failure/COPD  - Renal failure  - Anemia  - Advanced Liver disease     Plan: PT/OT WBAT, AROM and PROM  DVT Prophylaxis:   SCDx72hrs, ASA 81 mg BID x 2 weeks DISCHARGE PLAN: Home DISCHARGE NEEDS: HHPT, Walker, and 3-in-1 comode seat     Kerin Salen 05/28/2022, 7:56 AM  Patient ID: Cynthia Frost, female   DOB: 27-Feb-1951, 71 y.o.   MRN: [id number]

## 2022-05-29 DIAGNOSIS — M25662 Stiffness of left knee, not elsewhere classified: Secondary | ICD-10-CM | POA: Diagnosis not present

## 2022-05-29 DIAGNOSIS — M6281 Muscle weakness (generalized): Secondary | ICD-10-CM | POA: Diagnosis not present

## 2022-05-29 DIAGNOSIS — Z96652 Presence of left artificial knee joint: Secondary | ICD-10-CM | POA: Diagnosis not present

## 2022-05-31 DIAGNOSIS — M6281 Muscle weakness (generalized): Secondary | ICD-10-CM | POA: Diagnosis not present

## 2022-05-31 DIAGNOSIS — M25662 Stiffness of left knee, not elsewhere classified: Secondary | ICD-10-CM | POA: Diagnosis not present

## 2022-05-31 DIAGNOSIS — Z96652 Presence of left artificial knee joint: Secondary | ICD-10-CM | POA: Diagnosis not present

## 2022-06-05 DIAGNOSIS — M25662 Stiffness of left knee, not elsewhere classified: Secondary | ICD-10-CM | POA: Diagnosis not present

## 2022-06-05 DIAGNOSIS — M6281 Muscle weakness (generalized): Secondary | ICD-10-CM | POA: Diagnosis not present

## 2022-06-05 DIAGNOSIS — Z96652 Presence of left artificial knee joint: Secondary | ICD-10-CM | POA: Diagnosis not present

## 2022-06-05 DIAGNOSIS — Z471 Aftercare following joint replacement surgery: Secondary | ICD-10-CM | POA: Diagnosis not present

## 2022-06-07 DIAGNOSIS — T8130XA Disruption of wound, unspecified, initial encounter: Secondary | ICD-10-CM | POA: Diagnosis not present

## 2022-06-08 DIAGNOSIS — T8130XA Disruption of wound, unspecified, initial encounter: Secondary | ICD-10-CM | POA: Diagnosis not present

## 2022-06-09 DIAGNOSIS — T8130XA Disruption of wound, unspecified, initial encounter: Secondary | ICD-10-CM | POA: Diagnosis not present

## 2022-06-12 DIAGNOSIS — Z96652 Presence of left artificial knee joint: Secondary | ICD-10-CM | POA: Diagnosis not present

## 2022-06-12 DIAGNOSIS — M6281 Muscle weakness (generalized): Secondary | ICD-10-CM | POA: Diagnosis not present

## 2022-06-12 DIAGNOSIS — M25662 Stiffness of left knee, not elsewhere classified: Secondary | ICD-10-CM | POA: Diagnosis not present

## 2022-06-14 DIAGNOSIS — M6281 Muscle weakness (generalized): Secondary | ICD-10-CM | POA: Diagnosis not present

## 2022-06-14 DIAGNOSIS — M25662 Stiffness of left knee, not elsewhere classified: Secondary | ICD-10-CM | POA: Diagnosis not present

## 2022-06-14 DIAGNOSIS — Z96652 Presence of left artificial knee joint: Secondary | ICD-10-CM | POA: Diagnosis not present

## 2022-06-19 ENCOUNTER — Encounter (INDEPENDENT_AMBULATORY_CARE_PROVIDER_SITE_OTHER): Payer: Self-pay

## 2022-06-19 DIAGNOSIS — Z96652 Presence of left artificial knee joint: Secondary | ICD-10-CM | POA: Diagnosis not present

## 2022-06-19 DIAGNOSIS — M25662 Stiffness of left knee, not elsewhere classified: Secondary | ICD-10-CM | POA: Diagnosis not present

## 2022-06-19 DIAGNOSIS — M6281 Muscle weakness (generalized): Secondary | ICD-10-CM | POA: Diagnosis not present

## 2022-06-21 DIAGNOSIS — M25662 Stiffness of left knee, not elsewhere classified: Secondary | ICD-10-CM | POA: Diagnosis not present

## 2022-06-21 DIAGNOSIS — Z96652 Presence of left artificial knee joint: Secondary | ICD-10-CM | POA: Diagnosis not present

## 2022-06-21 DIAGNOSIS — M6281 Muscle weakness (generalized): Secondary | ICD-10-CM | POA: Diagnosis not present

## 2022-06-26 DIAGNOSIS — M6281 Muscle weakness (generalized): Secondary | ICD-10-CM | POA: Diagnosis not present

## 2022-06-26 DIAGNOSIS — M25662 Stiffness of left knee, not elsewhere classified: Secondary | ICD-10-CM | POA: Diagnosis not present

## 2022-06-26 DIAGNOSIS — Z96652 Presence of left artificial knee joint: Secondary | ICD-10-CM | POA: Diagnosis not present

## 2022-07-02 DIAGNOSIS — E1129 Type 2 diabetes mellitus with other diabetic kidney complication: Secondary | ICD-10-CM | POA: Diagnosis not present

## 2022-07-03 DIAGNOSIS — M6281 Muscle weakness (generalized): Secondary | ICD-10-CM | POA: Diagnosis not present

## 2022-07-03 DIAGNOSIS — Z96652 Presence of left artificial knee joint: Secondary | ICD-10-CM | POA: Diagnosis not present

## 2022-07-03 DIAGNOSIS — M25662 Stiffness of left knee, not elsewhere classified: Secondary | ICD-10-CM | POA: Diagnosis not present

## 2022-07-12 DIAGNOSIS — Z23 Encounter for immunization: Secondary | ICD-10-CM | POA: Diagnosis not present

## 2022-07-16 ENCOUNTER — Encounter (INDEPENDENT_AMBULATORY_CARE_PROVIDER_SITE_OTHER): Payer: Self-pay | Admitting: Ophthalmology

## 2022-07-16 ENCOUNTER — Ambulatory Visit (INDEPENDENT_AMBULATORY_CARE_PROVIDER_SITE_OTHER): Payer: Medicare Other | Admitting: Ophthalmology

## 2022-07-16 DIAGNOSIS — E113591 Type 2 diabetes mellitus with proliferative diabetic retinopathy without macular edema, right eye: Secondary | ICD-10-CM

## 2022-07-16 DIAGNOSIS — H4311 Vitreous hemorrhage, right eye: Secondary | ICD-10-CM

## 2022-07-16 DIAGNOSIS — E113592 Type 2 diabetes mellitus with proliferative diabetic retinopathy without macular edema, left eye: Secondary | ICD-10-CM

## 2022-07-16 DIAGNOSIS — H2512 Age-related nuclear cataract, left eye: Secondary | ICD-10-CM

## 2022-07-16 NOTE — Progress Notes (Signed)
07/16/2022     CHIEF COMPLAINT Patient presents for No chief complaint on file.     HISTORY OF PRESENT ILLNESS: Cynthia Frost is a 71 y.o. female who presents to the clinic today for:   HPI   10 weeks dilate ou color fp Pt states her vision has been stable Pt denies any new floaters or FOL  Pt states when she eats certain things her floaters come (they are not new)  Last edited by Morene Rankins, CMA on 07/16/2022  3:12 PM.      Referring physician: Darleen Crocker, MD [address] [place],  [place] [address]  HISTORICAL INFORMATION:   Selected notes from the MEDICAL RECORD NUMBER    Lab Results  Component Value Date   HGBA1C 6.0 (H) 05/23/2022     CURRENT MEDICATIONS: Current Outpatient Medications (Ophthalmic Drugs)  Medication Sig   Difluprednate 0.05 % EMUL Place 1 drop into the right eye See admin instructions. Instill 1 drop into right eye twice daily until 7/12 then instill 1 drop daily in the right eye for 7 days   ketorolac (ACULAR) 0.5 % ophthalmic solution Place 1 drop into the right eye 4 (four) times daily.   No current facility-administered medications for this visit. (Ophthalmic Drugs)   Current Outpatient Medications (Other)  Medication Sig   amLODipine (NORVASC) 10 MG tablet TAKE 1 TABLET DAILY   apixaban (ELIQUIS) 2.5 MG TABS tablet Take 1 tablet (2.5 mg total) by mouth 2 (two) times daily.   atorvastatin (LIPITOR) 10 MG tablet Take 10 mg by mouth 3 (three) times a week.   Boswellia-Glucosamine-Vit D (OSTEO BI-FLEX ONE PER DAY PO) Take 2 tablets by mouth daily.   carvedilol (COREG) 25 MG tablet Take 0.5 tablets (12.5 mg total) by mouth daily. (Patient taking differently: Take 25 mg by mouth 2 (two) times daily with a meal.)   Cholecalciferol (VITAMIN D3) 75 MCG (3000 UT) TABS Take 1 tablet by mouth daily. (Patient taking differently: Take 1 tablet by mouth every Sunday.)   clopidogrel (PLAVIX) 75 MG tablet Take 75 mg by mouth daily.    Cyanocobalamin (B-12 PO) Place 1 Dose under the tongue at bedtime.   diazepam (VALIUM) 5 MG tablet Take 5 mg by mouth once as needed for anxiety. Prior to surgery   doxazosin (CARDURA) 8 MG tablet Take 8 mg by mouth daily.    DULoxetine (CYMBALTA) 30 MG capsule Take 30 mg by mouth daily.   furosemide (LASIX) 20 MG tablet Take 20 mg by mouth daily.   losartan-hydrochlorothiazide (HYZAAR) 100-25 MG tablet Take 1 tablet by mouth daily.   Menthol-Methyl Salicylate (SALONPAS PAIN RELIEF PATCH EX) Apply 1 patch topically daily as needed (pain).   Multiple Vitamin (MULTIVITAMIN WITH MINERALS) TABS tablet Take 1 tablet by mouth daily.   Omega-3 Fatty Acids (OMEGA-3 FISH OIL PO) Take 1 capsule by mouth daily.   OVER THE COUNTER MEDICATION Take 2 capsules by mouth daily. Super Beets   tizanidine (ZANAFLEX) 2 MG capsule Take 1 capsule (2 mg total) by mouth 3 (three) times daily as needed for muscle spasms.   TURMERIC PO Take 2 capsules by mouth daily. Chew   No current facility-administered medications for this visit. (Other)      REVIEW OF SYSTEMS: ROS   Negative for: Constitutional, Gastrointestinal, Neurological, Skin, Genitourinary, Musculoskeletal, HENT, Endocrine, Cardiovascular, Eyes, Respiratory, Psychiatric, Allergic/Imm, Heme/Lymph Last edited by Hurman Horn, MD on 07/16/2022  4:18 PM.  ALLERGIES Allergies  Allergen Reactions   Corticosteroids Other (See Comments)    Increases glucose levels   Escitalopram Oxalate Diarrhea    diarrhea/poor sleep (08/2019)   Lactose Intolerance (Gi) Diarrhea   Rosuvastatin Calcium     Other reaction(s): nose bleeds   Tomato Other (See Comments)    Arthritis    PAST MEDICAL HISTORY Past Medical History:  Diagnosis Date   Anxiety    Arthritis    Atrial fibrillation ([place])    Depression    Dysrhythmia    Afib   Full dentures    Hypertension    Hypertension    Joint pain    Lactose intolerance    Osteoarthritis    Palpitations     Pre-diabetes    Prediabetes    Proliferative diabetic retinopathy of left eye determined by examination ([place]) 04/25/2022   Stenosis of right middle cerebral artery 2011   Wears glasses    Past Surgical History:  Procedure Laterality Date   CEREBRAL ANGIOGRAM     COLONOSCOPY     DIAGNOSTIC LAPAROSCOPY     fibroid   MASS EXCISION Right 04/27/2014   Procedure: EXCISION MASS RIGHT RING FINGER DEBRIDEMENT PROXIMAL INTERPHALANGEAL JOINT RIGHT RING FINGER (PIP);  Surgeon: Wynonia Sours, MD;  Location: [facility];  Service: Orthopedics;  Laterality: Right;   TOTAL KNEE ARTHROPLASTY Right 03/11/2022   Procedure: RIGHT TOTAL KNEE ARTHROPLASTY;  Surgeon: Frederik Pear, MD;  Location: [facility];  Service: Orthopedics;  Laterality: Right;   TOTAL KNEE ARTHROPLASTY Left 05/27/2022   Procedure: LEFT TOTAL KNEE ARTHROPLASTY;  Surgeon: Frederik Pear, MD;  Location: [facility];  Service: Orthopedics;  Laterality: Left;    FAMILY HISTORY Family History  Problem Relation Age of Onset   Dementia Mother    Hypertension Mother    Cancer Father    Obesity Father    Obesity Sister     SOCIAL HISTORY Social History   Tobacco Use   Smoking status: Never   Smokeless tobacco: Never  Vaping Use   Vaping Use: Never used  Substance Use Topics   Alcohol use: Not Currently    Comment: rare   Drug use: No         OPHTHALMIC EXAM:  Base Eye Exam     Visual Acuity (ETDRS)       Right Left   Dist cc 20/60 20/25 +3         Tonometry (Tonopen, 3:16 PM)       Right Left   Pressure 8 10         Pupils       Pupils   Right PERRL   Left PERRL         Visual Fields       Left Right    Full Full         Extraocular Movement       Right Left    Ortho Ortho    -- -- --  --  --  -- -- --   -- -- --  --  --  -- -- --           Neuro/Psych     Oriented x3: Yes   Mood/Affect: Normal         Dilation     Both eyes: 1.0% Mydriacyl, 2.5% Phenylephrine @ 3:13  PM           Slit Lamp and Fundus Exam     External Exam  Right Left   External Normal Normal         Slit Lamp Exam       Right Left   Lids/Lashes Normal Normal   Conjunctiva/Sclera White and quiet White and quiet   Cornea Clear Clear   Anterior Chamber Deep and quiet Deep and quiet   Iris Round and reactive Round and reactive   Lens Centered posterior chamber intraocular lens 2+ Nuclear sclerosis   Anterior Vitreous Normal Normal         Fundus Exam       Right Left   Posterior Vitreous Old white vitreous hemorrhage inferiorly, and small Vitreous hemorrhage centrally.  Clearing slightly Pre-retinal hemorrhage, Vitreous hemorrhage small inferior   Disc Normal    C/D Ratio 0.55 0.55   Macula Normal Normal   Vessels Temporal periphery, white NVE fronds Temporal periphery, white NVE fronds also with regional nonperfusion.   Periphery Peripheral white neovascularization distal temporal periphery appears to be present through the haze OD. Good local prp temporally OS temporal NVE now involutional post PRP locally temporally            IMAGING AND PROCEDURES  Imaging and Procedures for 07/16/22  Color Fundus Photography Optos - OU - Both Eyes       Right Eye Progression has no prior data. Disc findings include normal observations. Macula : normal observations. Periphery : neovascularization.   Left Eye Progression has no prior data.   Notes Central vitreous opacity, hemorrhage most likely from recent PVD triggering peripheral neovascularization seen temporally to bleed.  Clearing nicely  Not likely a hole or tear.  OD further clearance of hemorrhage post cataract surgery.  Vision clearing.  Continue observe   Good peripheral PRP OU temporally             ASSESSMENT/PLAN:  Proliferative diabetic retinopathy of right eye determined by examination ([place]) Clearing nicely  Proliferative diabetic retinopathy of left eye determined by  examination ([place]) Minor not active at this time good local PRP  Nuclear sclerotic cataract of left eye Okay to proceed with cataract surgery in the left eye with Dr. Talbert Forest  Vitreous hemorrhage, right eye ([place]) Clearing nicely with good acuity.  We will continue to observe no need for surgery at this time     ICD-10-CM   1. Vitreous hemorrhage, right eye (HCC)  H43.11 Color Fundus Photography Optos - OU - Both Eyes    2. Proliferative diabetic retinopathy of right eye determined by examination ([place])  E11.3591     3. Proliferative diabetic retinopathy of left eye determined by examination ([place])  G89.1694     4. Nuclear sclerotic cataract of left eye  H25.12       1.  OD Central vitreous hemorrhage continues to clear.  Less active PDR post PRP locally to region of nonperfusion.  2.  OS regional nonperfusion temporally also treated nicely.  No progression of PDR.  3.  Requires cataract surgery follow-up Dr. Talbert Forest  Ophthalmic Meds Ordered this visit:  No orders of the defined types were placed in this encounter.      Return in about 4 months (around 11/15/2022) for DILATE OU, COLOR FP, OCT.  There are no Patient Instructions on file for this visit.   Explained the diagnoses, plan, and follow up with the patient and they expressed understanding.  Patient expressed understanding of the importance of proper follow up care.   Clent Demark Gwynneth Fabio M.D. Diseases & Surgery of the Retina and Vitreous  [facility] 07/16/22     Abbreviations: M myopia (nearsighted); A astigmatism; H hyperopia (farsighted); P presbyopia; Mrx spectacle prescription;  CTL contact lenses; OD right eye; OS left eye; OU both eyes  XT exotropia; ET esotropia; PEK punctate epithelial keratitis; PEE punctate epithelial erosions; DES dry eye syndrome; MGD meibomian gland dysfunction; ATs artificial tears; PFAT's preservative free artificial tears; [place] nuclear sclerotic cataract; PSC posterior  subcapsular cataract; ERM epi-retinal membrane; PVD posterior vitreous detachment; RD retinal detachment; DM diabetes mellitus; DR diabetic retinopathy; NPDR non-proliferative diabetic retinopathy; PDR proliferative diabetic retinopathy; CSME clinically significant macular edema; DME diabetic macular edema; dbh dot blot hemorrhages; CWS cotton wool spot; POAG primary open angle glaucoma; C/D cup-to-disc ratio; HVF humphrey visual field; GVF goldmann visual field; OCT optical coherence tomography; IOP intraocular pressure; BRVO Branch retinal vein occlusion; CRVO central retinal vein occlusion; CRAO central retinal artery occlusion; BRAO branch retinal artery occlusion; RT retinal tear; SB scleral buckle; PPV pars plana vitrectomy; VH Vitreous hemorrhage; PRP panretinal laser photocoagulation; IVK intravitreal kenalog; VMT vitreomacular traction; MH Macular hole;  NVD neovascularization of the disc; NVE neovascularization elsewhere; AREDS age related eye disease study; ARMD age related macular degeneration; POAG primary open angle glaucoma; EBMD epithelial/anterior basement membrane dystrophy; ACIOL anterior chamber intraocular lens; IOL intraocular lens; PCIOL posterior chamber intraocular lens; Phaco/IOL phacoemulsification with intraocular lens placement; [place] photorefractive keratectomy; LASIK laser assisted in situ keratomileusis; HTN hypertension; DM diabetes mellitus; COPD chronic obstructive pulmonary disease

## 2022-07-16 NOTE — Assessment & Plan Note (Signed)
Clearing nicely with good acuity.  We will continue to observe no need for surgery at this time

## 2022-07-16 NOTE — Assessment & Plan Note (Signed)
Clearing nicely

## 2022-07-16 NOTE — Assessment & Plan Note (Signed)
Okay to proceed with cataract surgery in the left eye with Dr. Talbert Forest

## 2022-07-16 NOTE — Assessment & Plan Note (Signed)
Minor not active at this time good local PRP

## 2022-07-23 ENCOUNTER — Encounter: Payer: Medicare Other | Attending: Family Medicine | Admitting: Dietician

## 2022-07-23 DIAGNOSIS — E1169 Type 2 diabetes mellitus with other specified complication: Secondary | ICD-10-CM | POA: Insufficient documentation

## 2022-07-23 DIAGNOSIS — H2512 Age-related nuclear cataract, left eye: Secondary | ICD-10-CM | POA: Diagnosis not present

## 2022-07-23 NOTE — Progress Notes (Signed)
Patient was seen on 07/23/2022 for the first of a series of three diabetes self-management courses at [facility].  Patient Education Plan per assessed needs and concerns is to attend three course education program for Diabetes Self Management Education.  A1C was 6.5% on 07/02/2022.  The following learning objectives were met by the patient during this class: Describe diabetes, types of diabetes and pathophysiology State some common risk factors for diabetes Defines the role of glucose and insulin Describe the relationship between diabetes and cardiovascular and other risks State the members of the Healthcare Team States the rationale for glucose monitoring and when to test State their individual Target Range State the importance of logging glucose readings and how to interpret the readings Identifies A1C target Explain the correlation between A1c and eAG values State symptoms and treatment of high blood glucose and low blood glucose Explain proper technique for glucose testing and identify proper sharps disposal  Handouts given during class include: How to Thrive:  A Guide for Your Journey with Diabetes by the ADA Meal Plan Card and carbohydrate content list Dietary intake form Low Sodium Flavoring Tips Types of Fats Dining Out Label reading Snack list The diabetes portion plate Diabetes Resources A1c to eAG Conversion Chart Blood Glucose Log Diabetes Recommended Care Schedule Support Group Diabetes Success Plan Core Class Satisfaction Survey   Follow-Up Plan: Attend core 2  

## 2022-07-30 ENCOUNTER — Encounter: Payer: Medicare Other | Admitting: Dietician

## 2022-07-30 ENCOUNTER — Encounter: Payer: Self-pay | Admitting: Dietician

## 2022-07-30 DIAGNOSIS — E1169 Type 2 diabetes mellitus with other specified complication: Secondary | ICD-10-CM

## 2022-07-30 NOTE — Progress Notes (Signed)
Patient was seen on 07/30/2022 for the second of a series of three diabetes self-management courses at the [facility]. The following learning objectives were met by the patient during this class:  Describe the role of different macronutrients on glucose Explain how carbohydrates affect blood glucose State what foods contain the most carbohydrates Demonstrate carbohydrate counting Demonstrate how to read Nutrition Facts food label Describe effects of various fats on heart health Describe the importance of good nutrition for health and healthy eating strategies Describe techniques for managing your shopping, cooking and meal planning List strategies to follow meal plan when dining out Describe the effects of alcohol on glucose and how to use it safely  Goals:  Follow Diabetes Meal Plan as instructed  Aim to spread carbs evenly throughout the day  Aim for 3 meals per day and snacks as needed Include lean protein foods to meals/snacks  Monitor glucose levels as instructed by your doctor   Follow-Up Plan: Attend Core 3 Work towards following your personal food plan.  

## 2022-08-05 DIAGNOSIS — R059 Cough, unspecified: Secondary | ICD-10-CM | POA: Diagnosis not present

## 2022-08-05 DIAGNOSIS — R062 Wheezing: Secondary | ICD-10-CM | POA: Diagnosis not present

## 2022-08-06 ENCOUNTER — Ambulatory Visit: Payer: Medicare Other

## 2022-08-13 ENCOUNTER — Encounter: Payer: Self-pay | Admitting: Physician Assistant

## 2022-08-13 ENCOUNTER — Ambulatory Visit: Payer: Medicare Other | Attending: Physician Assistant | Admitting: Physician Assistant

## 2022-08-13 VITALS — BP 112/60 | HR 70 | Ht 65.0 in | Wt 275.4 lb

## 2022-08-13 DIAGNOSIS — E785 Hyperlipidemia, unspecified: Secondary | ICD-10-CM

## 2022-08-13 DIAGNOSIS — I5031 Acute diastolic (congestive) heart failure: Secondary | ICD-10-CM | POA: Diagnosis not present

## 2022-08-13 DIAGNOSIS — I4821 Permanent atrial fibrillation: Secondary | ICD-10-CM | POA: Diagnosis not present

## 2022-08-13 DIAGNOSIS — I10 Essential (primary) hypertension: Secondary | ICD-10-CM | POA: Diagnosis not present

## 2022-08-13 MED ORDER — FUROSEMIDE 40 MG PO TABS: 40.0000 mg | ORAL_TABLET | Freq: Every day | ORAL | 1 refills | Status: AC

## 2022-08-13 NOTE — Progress Notes (Signed)
Cardiology Office Note:    Date:  08/15/2022   ID:  Marrianne Sica, DOB May 16, 1951, MRN [id number]  PCP:  Gaynelle Arabian, [place] [facility] Cardiologist:  Kirk Ruths, MD     Referring MD: Gaynelle Arabian, MD   Chief Complaint  Patient presents with   Shortness of Breath    Wheezing     History of Present Illness:    Cynthia Frost is a 71 y.o. female with a hx of permanent atrial fibrillation, hypertension, hyperlipidemia, chronic diastolic heart failure, history of a right medial cerebral artery stenosis on Plavix and chronic dyspnea.  Patient reportedly had a possible heart attack roughly 20 years ago at [facility].  No record was available.  Myoview in December 2017 showed normal EF of 51%, no ischemia or infarction.  During the previous office visit, patient was noted to be in atrial fibrillation however she elected not to pursue cardioversion.  Echocardiogram in February 2022 showed a normal EF, mild LV hypertrophy, moderate pulm hypertension, moderate left atrial enlargement, mild MR.  Patient was last seen by Dr. Stanford Breed in January 2023 at which time she had dyspnea on exertion that was attributed to her weight.  No significant orthopnea and PND.  69-monthfollow-up was recommended.  Patient presents today for follow-up.  She is still in atrial fibrillation, heart rate is very well controlled.  For the past 2 weeks, she has noticed significant lower extremity edema, increasing dyspnea however no chest pain.  Her weight has increased by at least 10 pounds in the past 2 weeks.  On physical exam, she has at least 2+ pitting edema.  She also appears to have a small right lower pleural effusion as well.    Past Medical History:  Diagnosis Date   Anxiety    Arthritis    Atrial fibrillation ([place]    Depression    Dysrhythmia    Afib   Full dentures    Hypertension    Hypertension    Joint pain    Lactose intolerance    Osteoarthritis     Palpitations    Pre-diabetes    Prediabetes    Proliferative diabetic retinopathy of left eye determined by examination ([place] 04/25/2022   Stenosis of right middle cerebral artery 2011   Wears glasses     Past Surgical History:  Procedure Laterality Date   CEREBRAL ANGIOGRAM     COLONOSCOPY     DIAGNOSTIC LAPAROSCOPY     fibroid   MASS EXCISION Right 04/27/2014   Procedure: EXCISION MASS RIGHT RING FINGER DEBRIDEMENT PROXIMAL INTERPHALANGEAL JOINT RIGHT RING FINGER (PIP);  Surgeon: GWynonia Sours MD;  [place]  Service: Orthopedics;  Laterality: Right;   TOTAL KNEE ARTHROPLASTY Right 03/11/2022   Procedure: RIGHT TOTAL KNEE ARTHROPLASTY;  Surgeon: RFrederik Pear MD;  Location: [facility];  Service: Orthopedics;  Laterality: Right;   TOTAL KNEE ARTHROPLASTY Left 05/27/2022   Procedure: LEFT TOTAL KNEE ARTHROPLASTY;  Surgeon: RFrederik Pear MD;  Location: [facility];  Service: Orthopedics;  Laterality: Left;    Current Medications: Current Meds  Medication Sig   amLODipine (NORVASC) 10 MG tablet TAKE 1 TABLET DAILY   apixaban (ELIQUIS) 5 MG TABS tablet Take 5 mg by mouth 2 (two) times daily.   atorvastatin (LIPITOR) 10 MG tablet Take 10 mg by mouth 3 (three) times a week.   carvedilol (COREG) 25 MG tablet Take 0.5 tablets (12.5 mg total) by mouth daily. (Patient taking  differently: Take 25 mg by mouth 2 (two) times daily with a meal.)   clopidogrel (PLAVIX) 75 MG tablet Take 75 mg by mouth daily.   Cyanocobalamin (B-12 PO) Place 1 Dose under the tongue at bedtime.   diazepam (VALIUM) 5 MG tablet Take 5 mg by mouth once as needed for anxiety. Prior to surgery   Difluprednate 0.05 % EMUL Place 1 drop into the right eye See admin instructions. Instill 1 drop into right eye twice daily until 7/12 then instill 1 drop daily in the right eye for 7 days   doxazosin (CARDURA) 8 MG tablet Take 8 mg by mouth daily.    DULoxetine (CYMBALTA) 30 MG capsule Take 30 mg by mouth daily.    losartan-hydrochlorothiazide (HYZAAR) 100-25 MG tablet Take 1 tablet by mouth daily.   Menthol-Methyl Salicylate (SALONPAS PAIN RELIEF PATCH EX) Apply 1 patch topically daily as needed (pain).   Multiple Vitamin (MULTIVITAMIN WITH MINERALS) TABS tablet Take 1 tablet by mouth daily.   tizanidine (ZANAFLEX) 2 MG capsule Take 1 capsule (2 mg total) by mouth 3 (three) times daily as needed for muscle spasms.     Allergies:   Corticosteroids, Escitalopram oxalate, Lactose intolerance (gi), Rosuvastatin calcium, and Tomato   Social History   Socioeconomic History   Marital status: Married    Spouse name: Not on file   Number of children: 2   Years of education: Not on file   Highest education level: Not on file  Occupational History   Not on file  Tobacco Use   Smoking status: Never   Smokeless tobacco: Never  Vaping Use   Vaping Use: Never used  Substance and Sexual Activity   Alcohol use: Not Currently    Comment: rare   Drug use: No   Sexual activity: Not Currently  Other Topics Concern   Not on file  Social History Narrative   Not on file   Social Determinants of Health   Financial Resource Strain: Not on file  Food Insecurity: Not on file  Transportation Needs: Not on file  Physical Activity: Not on file  Stress: Not on file  Social Connections: Not on file     Family History: The patient's family history includes Cancer in her father; Dementia in her mother; Hypertension in her mother; Obesity in her father and sister.  ROS:   Please see the history of present illness.     All other systems reviewed and are negative.  EKGs/Labs/Other Studies Reviewed:    The following studies were reviewed today:  Echo 01/05/2021 1. Left ventricular ejection fraction, by estimation, is 60 to 65%. The  left ventricle has normal function. The left ventricle has no regional  wall motion abnormalities. There is mild left ventricular hypertrophy.  Left ventricular diastolic  parameters  are indeterminate.   2. Right ventricular systolic function is mildly reduced. The right  ventricular size is normal. There is moderately elevated pulmonary artery  systolic pressure. The estimated right ventricular systolic pressure is  90.2 mmHg.   3. Left atrial size was moderately dilated.   4. The mitral valve is normal in structure. Mild mitral valve  regurgitation. No evidence of mitral stenosis.   5. The aortic valve is normal in structure. Aortic valve regurgitation is  not visualized. No aortic stenosis is present.   6. The inferior vena cava is dilated in size with >50% respiratory  variability, suggesting right atrial pressure of 8 mmHg.   EKG:  EKG is not ordered  today.    Recent Labs: 09/29/2021: B Natriuretic Peptide 206.5 12/07/2021: ALT 17 05/23/2022: Hemoglobin 13.3; Platelets 215 08/13/2022: BUN 11; Creatinine, Ser 0.75; Potassium 3.5; Sodium 139  Recent Lipid Panel    Component Value Date/Time   CHOL 135 12/07/2021 1003   TRIG 64 12/07/2021 1003   HDL 67 12/07/2021 1003   CHOLHDL 2.0 12/07/2021 1003   LDLCALC 55 12/07/2021 1003     Risk Assessment/Calculations:    CHA2DS2-VASc Score = 4   This indicates a 4.8% annual risk of stroke. The patient's score is based upon: CHF History: 1 HTN History: 1 Diabetes History: 0 Stroke History: 0 Vascular Disease History: 0 Age Score: 1 Gender Score: 1          Physical Exam:    VS:  BP 112/60   Pulse 70   Ht '5\' 5"'$  (1.651 m)   Wt 275 lb 6.4 oz (124.9 kg)   SpO2 92%   BMI 45.83 kg/m        Wt Readings from Last 3 Encounters:  08/13/22 275 lb 6.4 oz (124.9 kg)  07/23/22 258 lb (117 kg)  05/27/22 240 lb (108.9 kg)     GEN:  Well nourished, well developed in no acute distress HEENT: Normal NECK: No JVD; No carotid bruits LYMPHATICS: No lymphadenopathy CARDIAC: Irregularly irregular, no murmurs, rubs, gallops RESPIRATORY: Diminished breath sound in the right base of the lung ABDOMEN:  Soft, non-tender, non-distended MUSCULOSKELETAL: 2+ pitting edema; No deformity  SKIN: Warm and dry NEUROLOGIC:  Alert and oriented x 3 PSYCHIATRIC:  Normal affect   ASSESSMENT:    1. Acute diastolic heart failure ([place])   2. Permanent atrial fibrillation ([place])   3. Essential hypertension   4. Hyperlipidemia LDL goal <70    PLAN:    In order of problems listed above:  Acute diastolic heart failure: Patient has at least 2+ pitting edema bilaterally.  Her weight has increased by more than 10 pounds recently.  I recommended increase Lasix to 40 mg twice a day for 3 days before going back to 40 mg daily thereafter.  Patient will need a basic metabolic panel today and also in 1 week.  Obtain echocardiogram  Permanent atrial fibrillation: Heart rate well controlled.  Continue Eliquis and carvedilol  Hypertension: Blood pressure stable  Hyperlipidemia: On Lipitor.       Medication Adjustments/Labs and Tests Ordered: Current medicines are reviewed at length with the patient today.  Concerns regarding medicines are outlined above.  Orders Placed This Encounter  Procedures   Basic metabolic panel   Basic metabolic panel   ECHOCARDIOGRAM COMPLETE   Meds ordered this encounter  Medications   furosemide (LASIX) 40 MG tablet    Sig: Take 1 tablet (40 mg total) by mouth daily.    Dispense:  90 tablet    Refill:  1    Dose change new Rx    Patient Instructions  Medication Instructions:  INCREASE Lasix to 40 mg 2 times a day for 3 days starting 08/14/22 then on day 4 ongoing take Lasix 40 mg daily *If you need a refill on your cardiac medications before your next appointment, please call your pharmacy*  Lab Work: Your physician recommends that you lab work TODAY:  BMP  Your physician recommends that you return for lab work 1 week:  BMP If you have labs (blood work) drawn today and your tests are completely normal, you will receive your results only by: [place] (if you  have  MyChart) OR A paper copy in the mail If you have any lab test that is abnormal or we need to change your treatment, we will call you to review the results.  Testing/Procedures: Your physician has requested that you have an echocardiogram. Echocardiography is a painless test that uses sound waves to create images of your heart. It provides your doctor with information about the size and shape of your heart and how well your heart's chambers and valves are working. This procedure takes approximately one hour. There are no restrictions for this procedure.   Follow-Up: At [facility], you and your health needs are our priority.  As part of our continuing mission to provide you with exceptional heart care, we have created designated Provider Care Teams.  These Care Teams include your primary Cardiologist (physician) and Advanced Practice Providers (APPs -  Physician Assistants and Nurse Practitioners) who all work together to provide you with the care you need, when you need it.  Your next appointment:   1-2 week(s)  The format for your next appointment:   In Person  Provider:   Almyra Deforest, PA-C        Other Instructions   Important Information About Sugar         Hilbert Corrigan, [place]  08/15/2022 9:37 PM    [place]

## 2022-08-13 NOTE — Patient Instructions (Signed)
Medication Instructions:  INCREASE Lasix to 40 mg 2 times a day for 3 days starting 08/14/22 then on day 4 ongoing take Lasix 40 mg daily *If you need a refill on your cardiac medications before your next appointment, please call your pharmacy*  Lab Work: Your physician recommends that you lab work TODAY:  BMP  Your physician recommends that you return for lab work 1 week:  BMP If you have labs (blood work) drawn today and your tests are completely normal, you will receive your results only by: [place] (if you have [place]) OR A paper copy in the mail If you have any lab test that is abnormal or we need to change your treatment, we will call you to review the results.  Testing/Procedures: Your physician has requested that you have an echocardiogram. Echocardiography is a painless test that uses sound waves to create images of your heart. It provides your doctor with information about the size and shape of your heart and how well your heart's chambers and valves are working. This procedure takes approximately one hour. There are no restrictions for this procedure.   Follow-Up: At [facility], you and your health needs are our priority.  As part of our continuing mission to provide you with exceptional heart care, we have created designated Provider Care Teams.  These Care Teams include your primary Cardiologist (physician) and Advanced Practice Providers (APPs -  Physician Assistants and Nurse Practitioners) who all work together to provide you with the care you need, when you need it.  Your next appointment:   1-2 week(s)  The format for your next appointment:   In Person  Provider:   Almyra Deforest, PA-C        Other Instructions   Important Information About Sugar

## 2022-08-14 ENCOUNTER — Ambulatory Visit: Payer: Medicare Other | Admitting: Physician Assistant

## 2022-08-14 LAB — BASIC METABOLIC PANEL
BUN/Creatinine Ratio: 15 (ref 12–28)
BUN: 11 mg/dL (ref 8–27)
CO2: 26 mmol/L (ref 20–29)
Calcium: 9.1 mg/dL (ref 8.7–10.3)
Chloride: 97 mmol/L (ref 96–106)
Creatinine, Ser: 0.75 mg/dL (ref 0.57–1.00)
Glucose: 109 mg/dL — ABNORMAL HIGH (ref 70–99)
Potassium: 3.5 mmol/L (ref 3.5–5.2)
Sodium: 139 mmol/L (ref 134–144)
eGFR: 84 mL/min/{1.73_m2} (ref >59)

## 2022-08-18 DIAGNOSIS — Z23 Encounter for immunization: Secondary | ICD-10-CM | POA: Diagnosis not present

## 2022-08-19 ENCOUNTER — Other Ambulatory Visit: Payer: Self-pay

## 2022-08-19 DIAGNOSIS — I5031 Acute diastolic (congestive) heart failure: Secondary | ICD-10-CM

## 2022-08-19 LAB — BASIC METABOLIC PANEL
BUN/Creatinine Ratio: 19 (ref 12–28)
BUN: 14 mg/dL (ref 8–27)
CO2: 30 mmol/L — ABNORMAL HIGH (ref 20–29)
Calcium: 10.1 mg/dL (ref 8.7–10.3)
Chloride: 95 mmol/L — ABNORMAL LOW (ref 96–106)
Creatinine, Ser: 0.75 mg/dL (ref 0.57–1.00)
Glucose: 149 mg/dL — ABNORMAL HIGH (ref 70–99)
Potassium: 3.3 mmol/L — ABNORMAL LOW (ref 3.5–5.2)
Sodium: 135 mmol/L (ref 134–144)
eGFR: 84 mL/min/{1.73_m2} (ref >59)

## 2022-08-20 ENCOUNTER — Ambulatory Visit: Payer: Medicare Other | Admitting: Physician Assistant

## 2022-08-29 ENCOUNTER — Ambulatory Visit (HOSPITAL_COMMUNITY): Payer: Medicare Other | Attending: Physician Assistant

## 2022-08-29 DIAGNOSIS — I5031 Acute diastolic (congestive) heart failure: Secondary | ICD-10-CM | POA: Diagnosis not present

## 2022-08-29 LAB — ECHOCARDIOGRAM COMPLETE: S' Lateral: 3.4 cm

## 2022-09-13 DIAGNOSIS — J9801 Acute bronchospasm: Secondary | ICD-10-CM | POA: Diagnosis not present

## 2022-09-13 DIAGNOSIS — R0602 Shortness of breath: Secondary | ICD-10-CM | POA: Diagnosis not present

## 2022-09-13 DIAGNOSIS — R609 Edema, unspecified: Secondary | ICD-10-CM | POA: Diagnosis not present

## 2022-10-15 ENCOUNTER — Ambulatory Visit: Payer: Medicare Other | Admitting: Dietician

## 2022-10-16 ENCOUNTER — Institutional Professional Consult (permissible substitution): Payer: Medicare Other | Admitting: Pulmonary Disease

## 2022-11-13 DIAGNOSIS — J90 Pleural effusion, not elsewhere classified: Secondary | ICD-10-CM | POA: Diagnosis not present

## 2022-11-13 DIAGNOSIS — R0600 Dyspnea, unspecified: Secondary | ICD-10-CM | POA: Diagnosis not present

## 2022-11-13 DIAGNOSIS — Z6841 Body Mass Index (BMI) 40.0 and over, adult: Secondary | ICD-10-CM | POA: Diagnosis not present

## 2022-11-13 DIAGNOSIS — E7800 Pure hypercholesterolemia, unspecified: Secondary | ICD-10-CM | POA: Diagnosis not present

## 2022-11-13 DIAGNOSIS — J811 Chronic pulmonary edema: Secondary | ICD-10-CM | POA: Diagnosis not present

## 2022-11-13 DIAGNOSIS — R188 Other ascites: Secondary | ICD-10-CM | POA: Diagnosis not present

## 2022-11-13 DIAGNOSIS — J42 Unspecified chronic bronchitis: Secondary | ICD-10-CM | POA: Diagnosis not present

## 2022-11-13 DIAGNOSIS — Z4682 Encounter for fitting and adjustment of non-vascular catheter: Secondary | ICD-10-CM | POA: Diagnosis not present

## 2022-11-13 DIAGNOSIS — A419 Sepsis, unspecified organism: Secondary | ICD-10-CM | POA: Diagnosis not present

## 2022-11-13 DIAGNOSIS — G931 Anoxic brain damage, not elsewhere classified: Secondary | ICD-10-CM | POA: Diagnosis not present

## 2022-11-13 DIAGNOSIS — D649 Anemia, unspecified: Secondary | ICD-10-CM | POA: Diagnosis present

## 2022-11-13 DIAGNOSIS — I700 Atherosclerosis of aorta: Secondary | ICD-10-CM | POA: Diagnosis not present

## 2022-11-13 DIAGNOSIS — I214 Non-ST elevation (NSTEMI) myocardial infarction: Secondary | ICD-10-CM | POA: Diagnosis not present

## 2022-11-13 DIAGNOSIS — N179 Acute kidney failure, unspecified: Secondary | ICD-10-CM | POA: Diagnosis not present

## 2022-11-13 DIAGNOSIS — J45909 Unspecified asthma, uncomplicated: Secondary | ICD-10-CM | POA: Diagnosis not present

## 2022-11-13 DIAGNOSIS — R0602 Shortness of breath: Secondary | ICD-10-CM | POA: Diagnosis not present

## 2022-11-13 DIAGNOSIS — E785 Hyperlipidemia, unspecified: Secondary | ICD-10-CM | POA: Diagnosis not present

## 2022-11-13 DIAGNOSIS — Z1152 Encounter for screening for COVID-19: Secondary | ICD-10-CM | POA: Diagnosis not present

## 2022-11-13 DIAGNOSIS — I5023 Acute on chronic systolic (congestive) heart failure: Secondary | ICD-10-CM | POA: Diagnosis not present

## 2022-11-13 DIAGNOSIS — Z9911 Dependence on respirator [ventilator] status: Secondary | ICD-10-CM | POA: Diagnosis not present

## 2022-11-13 DIAGNOSIS — J690 Pneumonitis due to inhalation of food and vomit: Secondary | ICD-10-CM | POA: Diagnosis not present

## 2022-11-13 DIAGNOSIS — I480 Paroxysmal atrial fibrillation: Secondary | ICD-10-CM | POA: Diagnosis not present

## 2022-11-13 DIAGNOSIS — J984 Other disorders of lung: Secondary | ICD-10-CM | POA: Diagnosis not present

## 2022-11-13 DIAGNOSIS — N178 Other acute kidney failure: Secondary | ICD-10-CM | POA: Diagnosis not present

## 2022-11-13 DIAGNOSIS — R601 Generalized edema: Secondary | ICD-10-CM | POA: Diagnosis not present

## 2022-11-13 DIAGNOSIS — J9601 Acute respiratory failure with hypoxia: Secondary | ICD-10-CM | POA: Diagnosis not present

## 2022-11-13 DIAGNOSIS — I10 Essential (primary) hypertension: Secondary | ICD-10-CM | POA: Diagnosis not present

## 2022-11-13 DIAGNOSIS — Z96659 Presence of unspecified artificial knee joint: Secondary | ICD-10-CM | POA: Diagnosis present

## 2022-11-13 DIAGNOSIS — J9801 Acute bronchospasm: Secondary | ICD-10-CM | POA: Diagnosis not present

## 2022-11-13 DIAGNOSIS — J9602 Acute respiratory failure with hypercapnia: Secondary | ICD-10-CM | POA: Diagnosis not present

## 2022-11-13 DIAGNOSIS — R918 Other nonspecific abnormal finding of lung field: Secondary | ICD-10-CM | POA: Diagnosis not present

## 2022-11-13 DIAGNOSIS — I509 Heart failure, unspecified: Secondary | ICD-10-CM | POA: Diagnosis not present

## 2022-11-13 DIAGNOSIS — Z79899 Other long term (current) drug therapy: Secondary | ICD-10-CM | POA: Diagnosis not present

## 2022-11-13 DIAGNOSIS — E876 Hypokalemia: Secondary | ICD-10-CM | POA: Diagnosis not present

## 2022-11-13 DIAGNOSIS — J9690 Respiratory failure, unspecified, unspecified whether with hypoxia or hypercapnia: Secondary | ICD-10-CM | POA: Diagnosis not present

## 2022-11-13 DIAGNOSIS — R0989 Other specified symptoms and signs involving the circulatory and respiratory systems: Secondary | ICD-10-CM | POA: Diagnosis not present

## 2022-11-13 DIAGNOSIS — J810 Acute pulmonary edema: Secondary | ICD-10-CM | POA: Diagnosis not present

## 2022-11-13 DIAGNOSIS — Z7901 Long term (current) use of anticoagulants: Secondary | ICD-10-CM | POA: Diagnosis not present

## 2022-11-13 DIAGNOSIS — I110 Hypertensive heart disease with heart failure: Secondary | ICD-10-CM | POA: Diagnosis not present

## 2022-11-13 DIAGNOSIS — Z452 Encounter for adjustment and management of vascular access device: Secondary | ICD-10-CM | POA: Diagnosis not present

## 2022-11-13 DIAGNOSIS — I4891 Unspecified atrial fibrillation: Secondary | ICD-10-CM | POA: Diagnosis not present

## 2022-11-13 DIAGNOSIS — J9811 Atelectasis: Secondary | ICD-10-CM | POA: Diagnosis not present

## 2022-11-13 DIAGNOSIS — I469 Cardiac arrest, cause unspecified: Secondary | ICD-10-CM | POA: Diagnosis not present

## 2022-11-13 DIAGNOSIS — J189 Pneumonia, unspecified organism: Secondary | ICD-10-CM | POA: Diagnosis not present

## 2022-11-13 DIAGNOSIS — I517 Cardiomegaly: Secondary | ICD-10-CM | POA: Diagnosis not present

## 2022-11-18 ENCOUNTER — Encounter (INDEPENDENT_AMBULATORY_CARE_PROVIDER_SITE_OTHER): Payer: Medicare Other | Admitting: Ophthalmology

## 2022-12-07 IMAGING — CR DG CHEST 2V
2 series · 2 of 2 positions shown · non-contrast
Comparison: 09/11/2016

CLINICAL DATA: Cough and shortness of breath for the past month.

EXAM:
CHEST - 2 VIEW

[w chest pa]
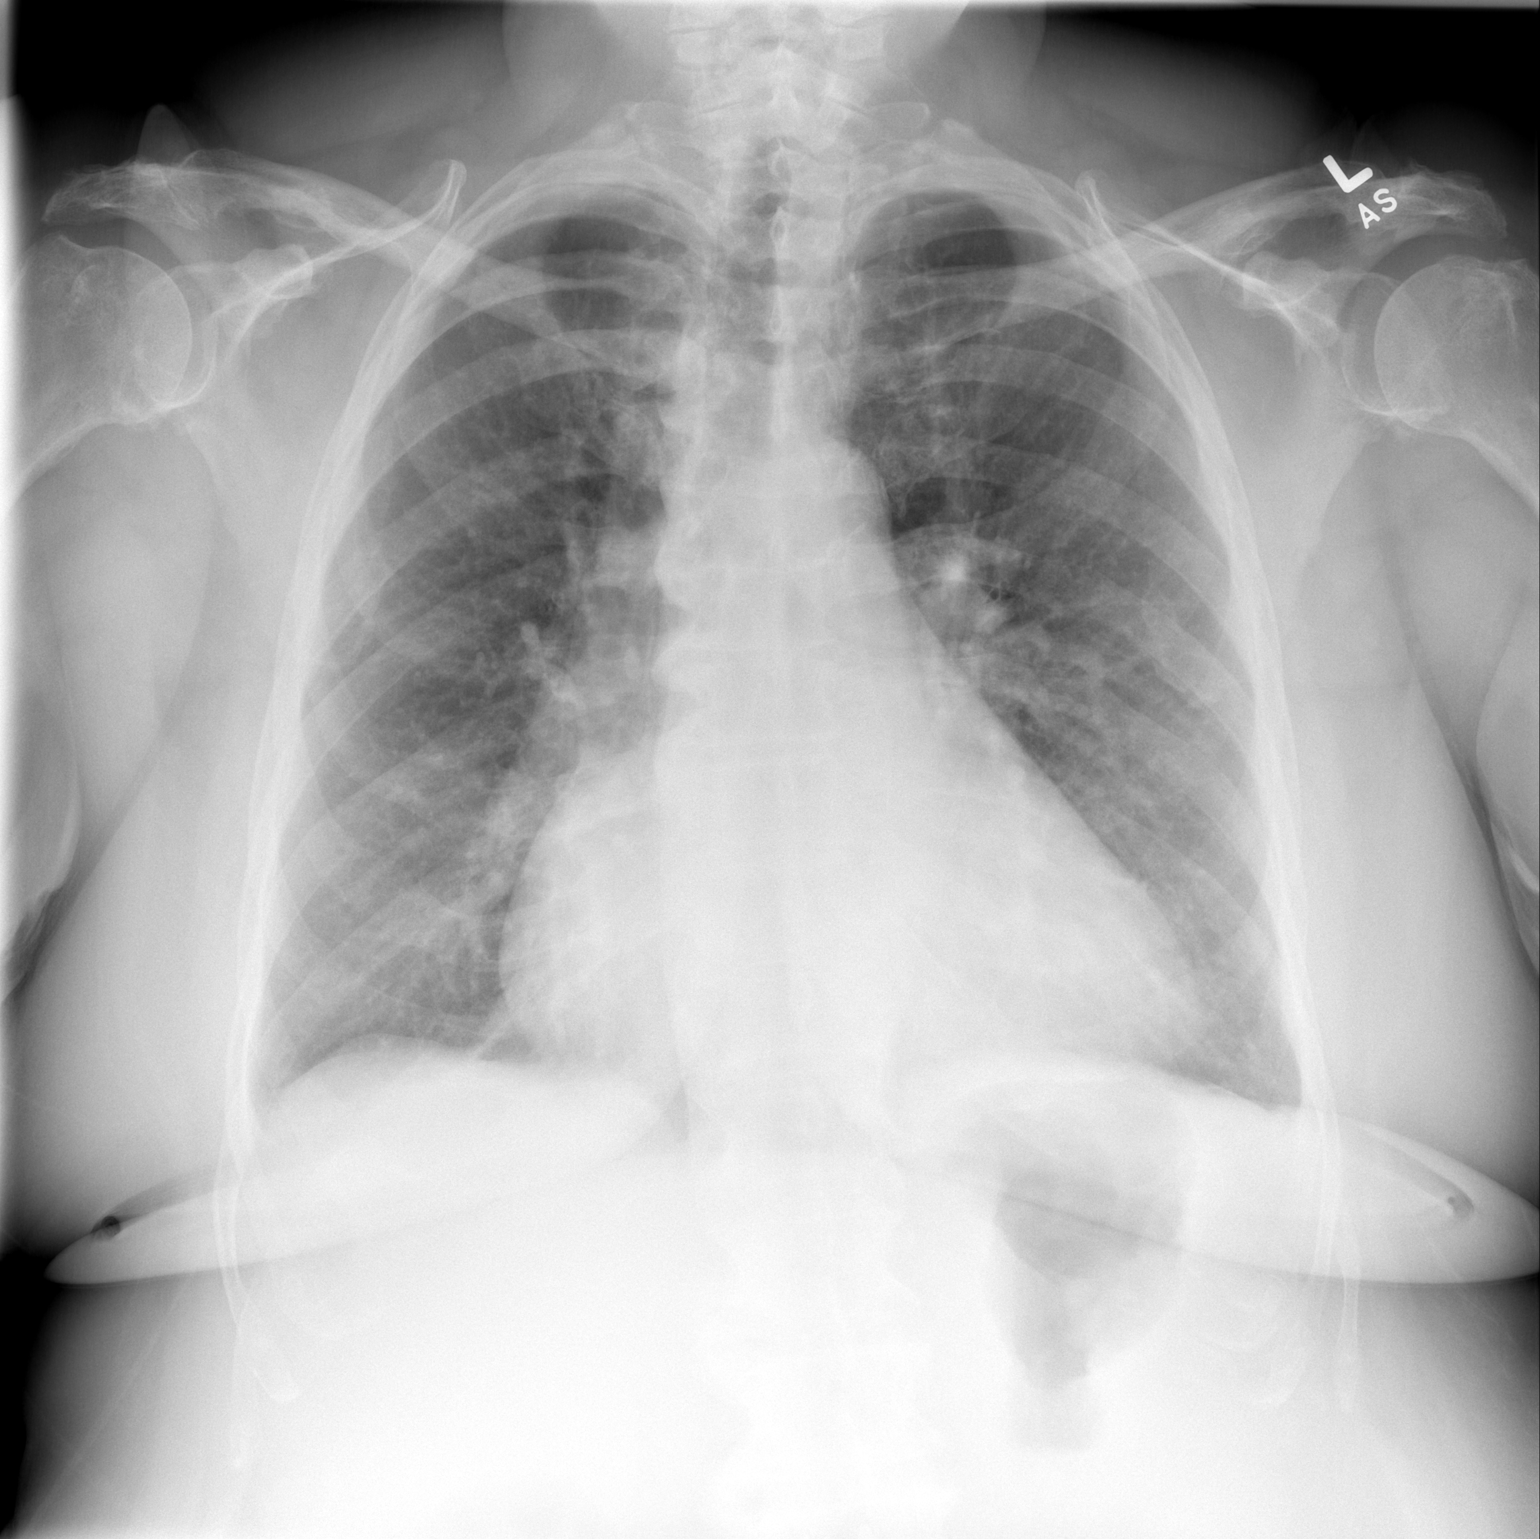

[w chest lat *]
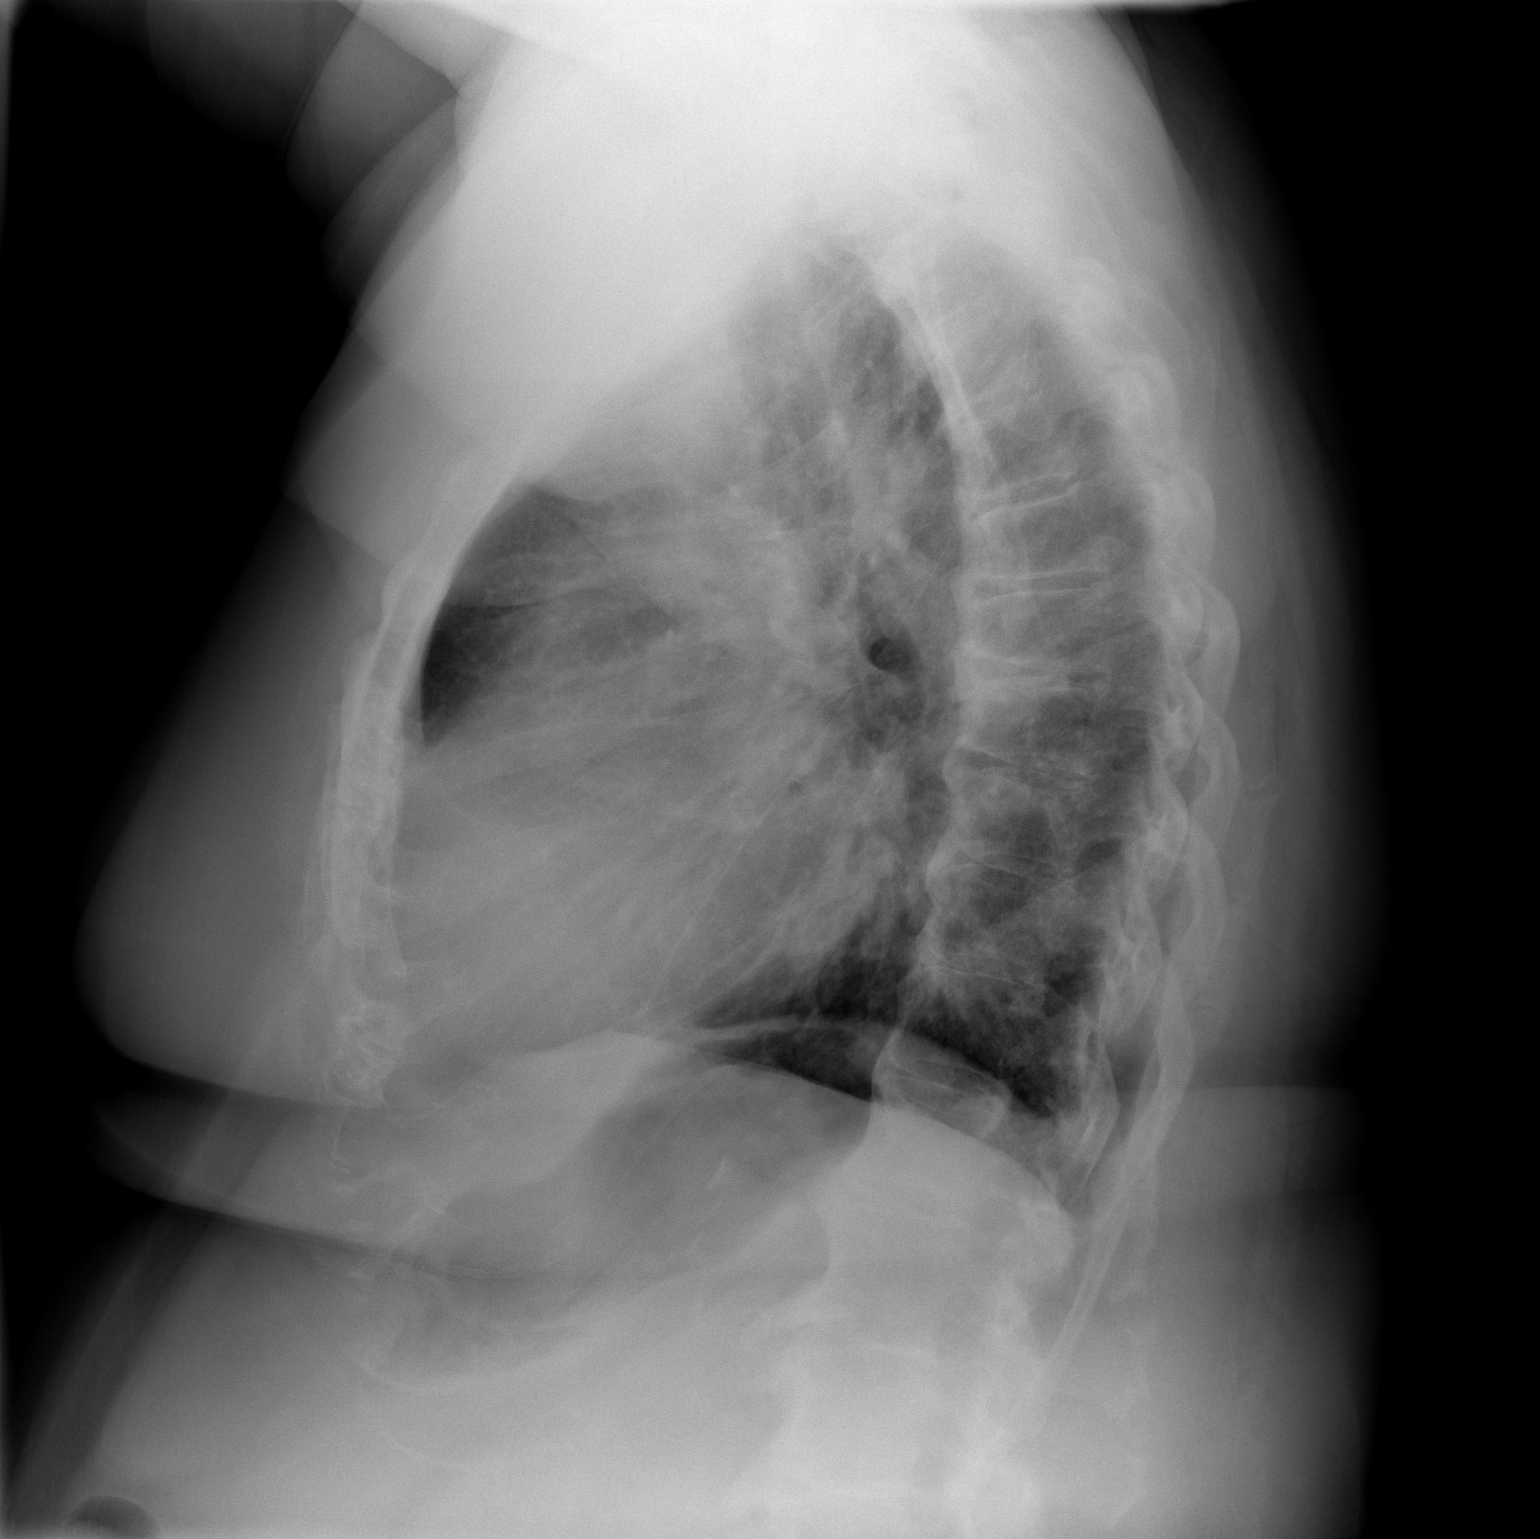

[2 of 2 positions shown; findings below may reference images not displayed]

FINDINGS: Enlarged cardiac silhouette with a mild increase in size. Stable
mildly prominent pulmonary vasculature and minimal bilateral pleural
thickening or fluid. Thoracic and upper lumbar spine degenerative
changes, including changes DISH.
IMPRESSION: 1. Mildly progressive cardiomegaly.
2. Stable mild pulmonary vascular congestion and minimal bilateral
pleural thickening or fluid.

## 2023-10-02 IMAGING — CR DG CHEST 2V
2 series · 2 of 2 positions shown · non-contrast
Comparison: 12/04/2020

CLINICAL DATA: Cough, short of breath, dyspnea on exertion for 1
week

EXAM:
CHEST - 2 VIEW

[w chest pa]
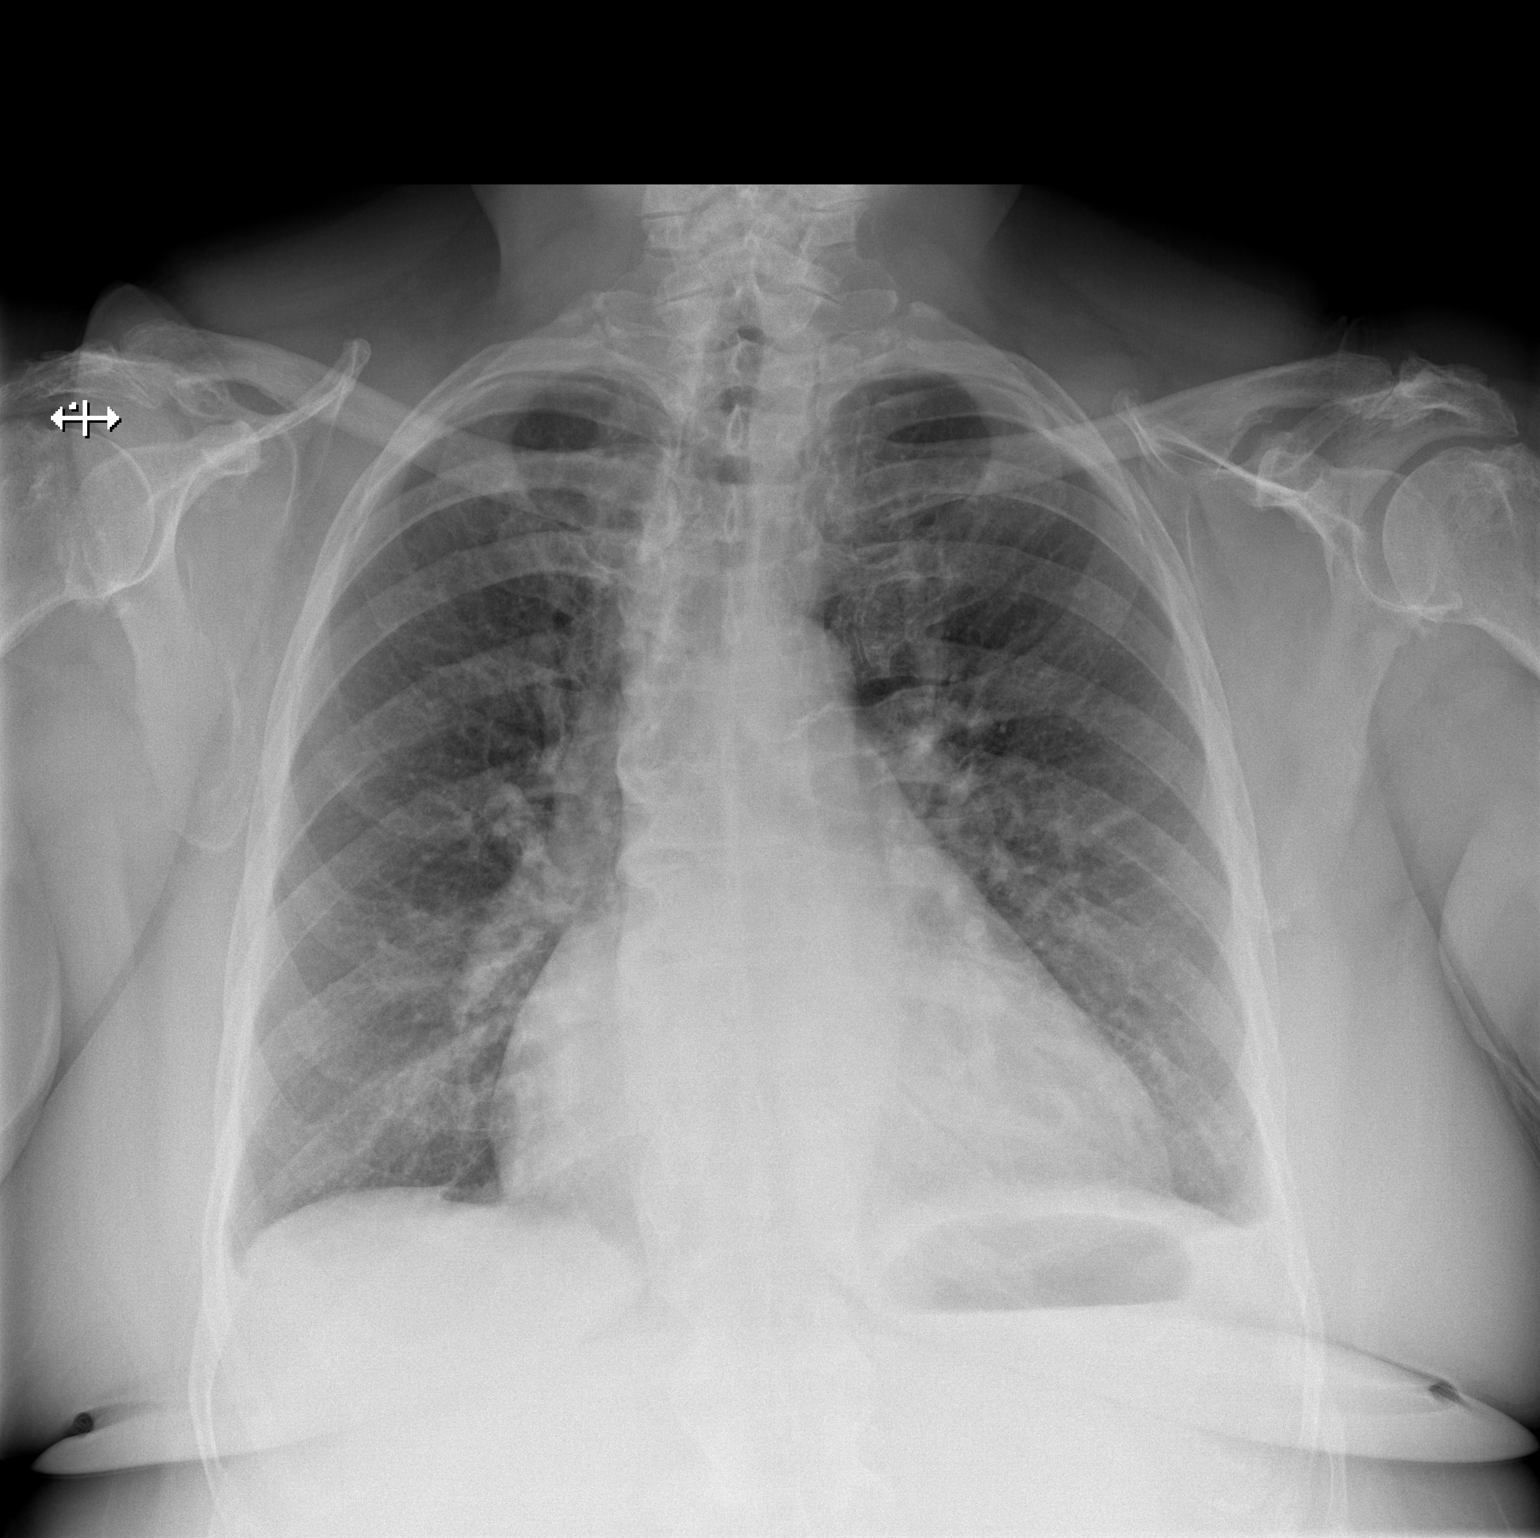

[w chest lat]
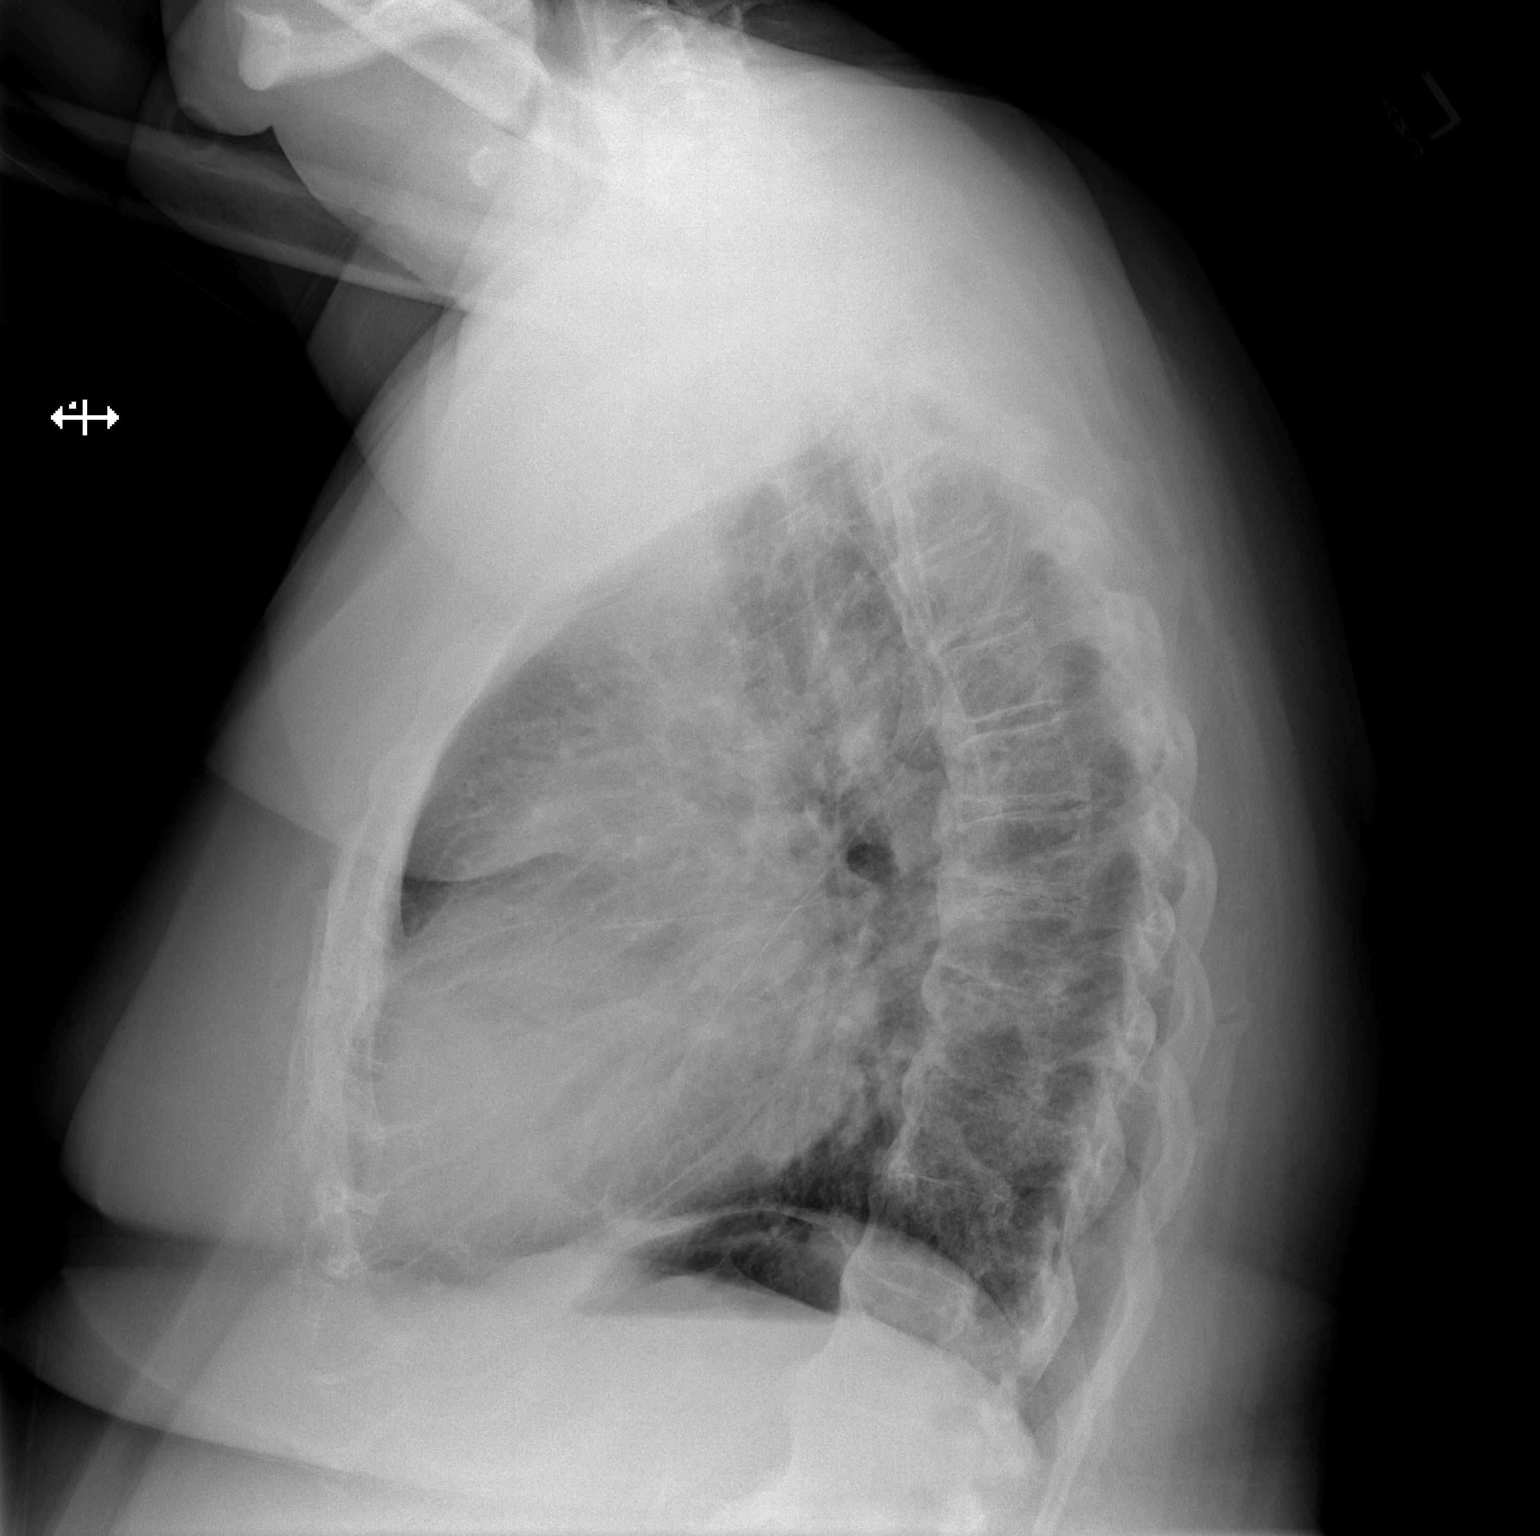

[2 of 2 positions shown; findings below may reference images not displayed]

FINDINGS: Frontal and lateral views of the chest demonstrate a stable cardiac
silhouette. No acute airspace disease, effusion, or pneumothorax. No
acute bony abnormality.
IMPRESSION: 1. Stable chest, no acute process.
